# Patient Record
Sex: Female | Born: 1985 | Hispanic: No | Marital: Married | Smoking: Never smoker
Health system: Southern US, Community
[De-identification: ages and names within clinical notes are randomized; demographics above are authoritative.]

## PROBLEM LIST (undated history)

## (undated) DIAGNOSIS — N200 Calculus of kidney: Secondary | ICD-10-CM

## (undated) DIAGNOSIS — M25569 Pain in unspecified knee: Secondary | ICD-10-CM

## (undated) DIAGNOSIS — O139 Gestational [pregnancy-induced] hypertension without significant proteinuria, unspecified trimester: Secondary | ICD-10-CM

## (undated) DIAGNOSIS — Z87442 Personal history of urinary calculi: Secondary | ICD-10-CM

## (undated) DIAGNOSIS — R55 Syncope and collapse: Secondary | ICD-10-CM

## (undated) DIAGNOSIS — M4306 Spondylolysis, lumbar region: Secondary | ICD-10-CM

## (undated) HISTORY — PX: KIDNEY STONE SURGERY: SHX686

---
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
## 2004-07-06 ENCOUNTER — Ambulatory Visit (HOSPITAL_COMMUNITY): Payer: Self-pay | Admitting: Psychiatry

## 2004-10-19 ENCOUNTER — Ambulatory Visit (HOSPITAL_COMMUNITY): Payer: Self-pay | Admitting: Psychiatry

## 2005-01-14 ENCOUNTER — Ambulatory Visit (HOSPITAL_COMMUNITY): Payer: Self-pay | Admitting: Psychiatry

## 2005-07-05 ENCOUNTER — Ambulatory Visit (HOSPITAL_COMMUNITY): Payer: Self-pay | Admitting: Psychiatry

## 2005-07-23 ENCOUNTER — Other Ambulatory Visit: Admission: RE | Admit: 2005-07-23 | Discharge: 2005-07-23 | Payer: Self-pay | Admitting: Obstetrics and Gynecology

## 2005-09-11 ENCOUNTER — Ambulatory Visit (HOSPITAL_COMMUNITY): Payer: Self-pay | Admitting: Psychiatry

## 2005-10-11 ENCOUNTER — Ambulatory Visit (HOSPITAL_COMMUNITY): Admission: RE | Admit: 2005-10-11 | Discharge: 2005-10-11 | Payer: Self-pay | Admitting: Obstetrics and Gynecology

## 2005-12-09 ENCOUNTER — Inpatient Hospital Stay (HOSPITAL_COMMUNITY): Admission: AD | Admit: 2005-12-09 | Discharge: 2005-12-09 | Payer: Self-pay | Admitting: Obstetrics and Gynecology

## 2005-12-24 ENCOUNTER — Inpatient Hospital Stay (HOSPITAL_COMMUNITY): Admission: AD | Admit: 2005-12-24 | Discharge: 2005-12-24 | Payer: Self-pay | Admitting: Obstetrics and Gynecology

## 2005-12-26 ENCOUNTER — Inpatient Hospital Stay (HOSPITAL_COMMUNITY): Admission: AD | Admit: 2005-12-26 | Discharge: 2005-12-31 | Payer: Self-pay | Admitting: Obstetrics and Gynecology

## 2006-08-04 ENCOUNTER — Other Ambulatory Visit: Admission: RE | Admit: 2006-08-04 | Discharge: 2006-08-04 | Payer: Self-pay | Admitting: Obstetrics and Gynecology

## 2014-02-24 DIAGNOSIS — N201 Calculus of ureter: Secondary | ICD-10-CM

## 2014-02-24 HISTORY — DX: Calculus of ureter: N20.1

## 2014-03-02 DIAGNOSIS — N200 Calculus of kidney: Secondary | ICD-10-CM

## 2014-03-02 DIAGNOSIS — M549 Dorsalgia, unspecified: Secondary | ICD-10-CM | POA: Insufficient documentation

## 2014-03-02 DIAGNOSIS — G43909 Migraine, unspecified, not intractable, without status migrainosus: Secondary | ICD-10-CM | POA: Insufficient documentation

## 2014-03-02 DIAGNOSIS — R110 Nausea: Secondary | ICD-10-CM | POA: Insufficient documentation

## 2014-03-02 HISTORY — DX: Calculus of kidney: N20.0

## 2014-03-02 HISTORY — DX: Migraine, unspecified, not intractable, without status migrainosus: G43.909

## 2014-03-02 HISTORY — DX: Nausea: R11.0

## 2015-04-25 ENCOUNTER — Encounter (HOSPITAL_COMMUNITY): Payer: Self-pay | Admitting: Emergency Medicine

## 2015-04-25 ENCOUNTER — Emergency Department (HOSPITAL_COMMUNITY): Payer: No Typology Code available for payment source

## 2015-04-25 ENCOUNTER — Emergency Department (HOSPITAL_COMMUNITY)
Admission: EM | Admit: 2015-04-25 | Discharge: 2015-04-25 | Disposition: A | Discharge status: Home / Self Care | Payer: No Typology Code available for payment source | Attending: Emergency Medicine | Admitting: Emergency Medicine

## 2015-04-25 DIAGNOSIS — S40012A Contusion of left shoulder, initial encounter: Secondary | ICD-10-CM | POA: Diagnosis not present

## 2015-04-25 DIAGNOSIS — S8002XA Contusion of left knee, initial encounter: Secondary | ICD-10-CM | POA: Insufficient documentation

## 2015-04-25 DIAGNOSIS — Y9389 Activity, other specified: Secondary | ICD-10-CM | POA: Insufficient documentation

## 2015-04-25 DIAGNOSIS — Y92410 Unspecified street and highway as the place of occurrence of the external cause: Secondary | ICD-10-CM | POA: Insufficient documentation

## 2015-04-25 DIAGNOSIS — Z3202 Encounter for pregnancy test, result negative: Secondary | ICD-10-CM | POA: Insufficient documentation

## 2015-04-25 DIAGNOSIS — Y998 Other external cause status: Secondary | ICD-10-CM | POA: Diagnosis not present

## 2015-04-25 DIAGNOSIS — S4992XA Unspecified injury of left shoulder and upper arm, initial encounter: Secondary | ICD-10-CM | POA: Diagnosis present

## 2015-04-25 HISTORY — DX: Pain in unspecified knee: M25.569

## 2015-04-25 LAB — URINALYSIS, ROUTINE W REFLEX MICROSCOPIC
Bilirubin Urine: NEGATIVE
GLUCOSE, UA: NEGATIVE mg/dL
Ketones, ur: NEGATIVE mg/dL
Nitrite: NEGATIVE
PH: 6.5 (ref 5.0–8.0)
PROTEIN: NEGATIVE mg/dL
Specific Gravity, Urine: 1.024 (ref 1.005–1.030)
Urobilinogen, UA: 1 mg/dL (ref 0.0–1.0)

## 2015-04-25 LAB — URINE MICROSCOPIC-ADD ON

## 2015-04-25 LAB — POC URINE PREG, ED: PREG TEST UR: NEGATIVE

## 2015-04-25 MED ORDER — NAPROXEN 500 MG PO TABS: 500.0000 mg | ORAL_TABLET | Freq: Two times a day (BID) | ORAL | Status: DC

## 2015-04-25 MED ORDER — HYDROCODONE-ACETAMINOPHEN 5-325 MG PO TABS
1.0000 | ORAL_TABLET | Freq: Once | ORAL | Status: AC
  Administered 2015-04-25: 1 via ORAL
  Filled 2015-04-25: qty 1

## 2015-04-25 MED ORDER — HYDROCODONE-ACETAMINOPHEN 5-325 MG PO TABS: 1.0000 | ORAL_TABLET | ORAL | Status: DC | PRN

## 2015-04-25 MED ORDER — OXYCODONE-ACETAMINOPHEN 5-325 MG PO TABS
1.0000 | ORAL_TABLET | Freq: Once | ORAL | Status: AC
  Administered 2015-04-25: 1 via ORAL
  Filled 2015-04-25: qty 1
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
## 2015-04-30 ENCOUNTER — Emergency Department (HOSPITAL_COMMUNITY): Payer: Managed Care, Other (non HMO)

## 2015-04-30 ENCOUNTER — Encounter (HOSPITAL_COMMUNITY): Payer: Self-pay | Admitting: *Deleted

## 2015-04-30 ENCOUNTER — Emergency Department (HOSPITAL_COMMUNITY)
Admission: EM | Admit: 2015-04-30 | Discharge: 2015-04-30 | Disposition: A | Discharge status: Home / Self Care | Payer: Managed Care, Other (non HMO) | Attending: Emergency Medicine | Admitting: Emergency Medicine

## 2015-04-30 DIAGNOSIS — Z791 Long term (current) use of non-steroidal anti-inflammatories (NSAID): Secondary | ICD-10-CM | POA: Insufficient documentation

## 2015-04-30 DIAGNOSIS — M25512 Pain in left shoulder: Secondary | ICD-10-CM

## 2015-04-30 DIAGNOSIS — Y9389 Activity, other specified: Secondary | ICD-10-CM | POA: Diagnosis not present

## 2015-04-30 DIAGNOSIS — Y92410 Unspecified street and highway as the place of occurrence of the external cause: Secondary | ICD-10-CM | POA: Insufficient documentation

## 2015-04-30 DIAGNOSIS — S46912A Strain of unspecified muscle, fascia and tendon at shoulder and upper arm level, left arm, initial encounter: Secondary | ICD-10-CM | POA: Insufficient documentation

## 2015-04-30 DIAGNOSIS — S4992XA Unspecified injury of left shoulder and upper arm, initial encounter: Secondary | ICD-10-CM | POA: Diagnosis present

## 2015-04-30 DIAGNOSIS — T148XXA Other injury of unspecified body region, initial encounter: Secondary | ICD-10-CM

## 2015-04-30 DIAGNOSIS — Y998 Other external cause status: Secondary | ICD-10-CM | POA: Insufficient documentation

## 2015-04-30 MED ORDER — HYDROMORPHONE HCL 1 MG/ML IJ SOLN
1.0000 mg | Freq: Once | INTRAMUSCULAR | Status: AC
  Administered 2015-04-30: 1 mg via INTRAMUSCULAR
  Filled 2015-04-30: qty 1

## 2015-04-30 MED ORDER — METHOCARBAMOL 500 MG PO TABS: 500.0000 mg | ORAL_TABLET | Freq: Three times a day (TID) | ORAL | Status: DC | PRN

## 2015-04-30 NOTE — ED Notes (Signed)
Patient transported to CT 

## 2015-04-30 NOTE — Discharge Instructions (Signed)
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
## 2015-05-29 ENCOUNTER — Emergency Department (HOSPITAL_COMMUNITY)
Admission: EM | Admit: 2015-05-29 | Discharge: 2015-05-29 | Disposition: A | Discharge status: Home / Self Care | Payer: Managed Care, Other (non HMO) | Attending: Emergency Medicine | Admitting: Emergency Medicine

## 2015-05-29 ENCOUNTER — Encounter (HOSPITAL_COMMUNITY): Payer: Self-pay | Admitting: Emergency Medicine

## 2015-05-29 ENCOUNTER — Emergency Department (HOSPITAL_COMMUNITY): Payer: Managed Care, Other (non HMO)

## 2015-05-29 DIAGNOSIS — M25532 Pain in left wrist: Secondary | ICD-10-CM | POA: Diagnosis not present

## 2015-05-29 DIAGNOSIS — Z880 Allergy status to penicillin: Secondary | ICD-10-CM | POA: Insufficient documentation

## 2015-05-29 DIAGNOSIS — R200 Anesthesia of skin: Secondary | ICD-10-CM | POA: Diagnosis not present

## 2015-05-29 DIAGNOSIS — R2232 Localized swelling, mass and lump, left upper limb: Secondary | ICD-10-CM | POA: Diagnosis present

## 2015-05-29 LAB — D-DIMER, QUANTITATIVE (NOT AT ARMC): D DIMER QUANT: 0.34 ug{FEU}/mL (ref 0.00–0.48)

## 2015-05-29 MED ORDER — NAPROXEN 500 MG PO TABS: 500.0000 mg | ORAL_TABLET | Freq: Two times a day (BID) | ORAL | Status: DC

## 2015-05-29 MED ORDER — IBUPROFEN 800 MG PO TABS
800.0000 mg | ORAL_TABLET | Freq: Once | ORAL | Status: AC
  Administered 2015-05-29: 800 mg via ORAL
  Filled 2015-05-29: qty 1

## 2015-05-29 NOTE — ED Notes (Signed)
PA at bedside.

## 2015-05-29 NOTE — Discharge Instructions (Signed)
1. Medications: naproxen, usual home medications 2. Treatment: rest, drink plenty of fluids, splint 3. Follow Up: please followup with your orthopedist for discussion of your diagnoses and further evaluation after today's visit; please return to the ER for severe pain, poor blood flow to left hand, new or worsening symptoms    Paresthesia Paresthesia is a burning or prickling feeling. This feeling can happen in any part of the body. It often happens in the hands, arms, legs, or feet. HOME CARE  Avoid drinking alcohol.  Try massage or needle therapy (acupuncture) to help with your problems.  Keep all doctor visits as told. GET HELP RIGHT AWAY IF:   You feel weak.  You have trouble walking or moving.  You have problems speaking or seeing.  You feel confused.  You cannot control when you poop (bowel movement) or pee (urinate).  You lose feeling (numbness) after an injury.  You pass out (faint).  Your burning or prickling feeling gets worse when you walk.  You have pain, cramps, or feel dizzy.  You have a rash. MAKE SURE YOU:   Understand these instructions.  Will watch your condition.  Will get help right away if you are not doing well or get worse. Document Released: 08/22/2008 Document Revised: 12/02/2011 Document Reviewed: 05/31/2011 [facility] Patient Information 2015 [place], [place]. This information is not intended to replace advice given to you by your health care provider. Make sure you discuss any questions you have with your health care provider.
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
## 2015-05-29 NOTE — ED Notes (Signed)
Patient brought back to room; assisted patient with getting into a gown; Mandy, NT present in room

## 2015-05-29 NOTE — ED Notes (Signed)
Patient transported to X-ray 

## 2015-08-20 ENCOUNTER — Emergency Department (HOSPITAL_COMMUNITY): Payer: Managed Care, Other (non HMO)

## 2015-08-20 ENCOUNTER — Encounter (HOSPITAL_COMMUNITY): Payer: Self-pay | Admitting: Emergency Medicine

## 2015-08-20 ENCOUNTER — Observation Stay (HOSPITAL_COMMUNITY)
Admission: EM | Admit: 2015-08-20 | Discharge: 2015-08-22 | Disposition: A | Discharge status: Home / Self Care | Payer: Managed Care, Other (non HMO) | Attending: Internal Medicine | Admitting: Internal Medicine

## 2015-08-20 DIAGNOSIS — R609 Edema, unspecified: Secondary | ICD-10-CM

## 2015-08-20 DIAGNOSIS — F418 Other specified anxiety disorders: Secondary | ICD-10-CM | POA: Insufficient documentation

## 2015-08-20 DIAGNOSIS — E876 Hypokalemia: Secondary | ICD-10-CM | POA: Diagnosis not present

## 2015-08-20 DIAGNOSIS — M7989 Other specified soft tissue disorders: Secondary | ICD-10-CM | POA: Insufficient documentation

## 2015-08-20 DIAGNOSIS — Z791 Long term (current) use of non-steroidal anti-inflammatories (NSAID): Secondary | ICD-10-CM | POA: Diagnosis not present

## 2015-08-20 DIAGNOSIS — F419 Anxiety disorder, unspecified: Secondary | ICD-10-CM | POA: Diagnosis not present

## 2015-08-20 DIAGNOSIS — R55 Syncope and collapse: Secondary | ICD-10-CM | POA: Diagnosis not present

## 2015-08-20 LAB — CBC WITH DIFFERENTIAL/PLATELET
Basophils Absolute: 0 10*3/uL (ref 0.0–0.1)
Basophils Relative: 0 %
EOS ABS: 0.3 10*3/uL (ref 0.0–0.7)
Eosinophils Relative: 3 %
HEMATOCRIT: 40.3 % (ref 36.0–46.0)
HEMOGLOBIN: 13.4 g/dL (ref 12.0–15.0)
LYMPHS ABS: 3.2 10*3/uL (ref 0.7–4.0)
Lymphocytes Relative: 28 %
MCH: 29.1 pg (ref 26.0–34.0)
MCHC: 33.3 g/dL (ref 30.0–36.0)
MCV: 87.4 fL (ref 78.0–100.0)
MONOS PCT: 7 %
Monocytes Absolute: 0.9 10*3/uL (ref 0.1–1.0)
NEUTROS ABS: 7 10*3/uL (ref 1.7–7.7)
NEUTROS PCT: 62 %
Platelets: 270 10*3/uL (ref 150–400)
RBC: 4.61 MIL/uL (ref 3.87–5.11)
RDW: 12.6 % (ref 11.5–15.5)
WBC: 11.4 10*3/uL — AB (ref 4.0–10.5)

## 2015-08-20 LAB — COMPREHENSIVE METABOLIC PANEL
ALBUMIN: 3.7 g/dL (ref 3.5–5.0)
ALK PHOS: 61 U/L (ref 38–126)
ALT: 19 U/L (ref 14–54)
AST: 23 U/L (ref 15–41)
Anion gap: 8 (ref 5–15)
BILIRUBIN TOTAL: 0.5 mg/dL (ref 0.3–1.2)
BUN: 9 mg/dL (ref 6–20)
CALCIUM: 9.1 mg/dL (ref 8.9–10.3)
CO2: 21 mmol/L — ABNORMAL LOW (ref 22–32)
CREATININE: 0.72 mg/dL (ref 0.44–1.00)
Chloride: 106 mmol/L (ref 101–111)
GFR calc Af Amer: >60 mL/min (ref >60)
GLUCOSE: 90 mg/dL (ref 65–99)
Potassium: 3.3 mmol/L — ABNORMAL LOW (ref 3.5–5.1)
Sodium: 135 mmol/L (ref 135–145)
TOTAL PROTEIN: 6.8 g/dL (ref 6.5–8.1)

## 2015-08-20 LAB — I-STAT CHEM 8, ED
BUN: 10 mg/dL (ref 6–20)
CALCIUM ION: 1.19 mmol/L (ref 1.12–1.23)
CHLORIDE: 104 mmol/L (ref 101–111)
CREATININE: 0.6 mg/dL (ref 0.44–1.00)
Glucose, Bld: 89 mg/dL (ref 65–99)
HEMATOCRIT: 44 % (ref 36.0–46.0)
Hemoglobin: 15 g/dL (ref 12.0–15.0)
Potassium: 3.4 mmol/L — ABNORMAL LOW (ref 3.5–5.1)
SODIUM: 140 mmol/L (ref 135–145)
TCO2: 20 mmol/L (ref 0–100)

## 2015-08-20 LAB — I-STAT BETA HCG BLOOD, ED (MC, WL, AP ONLY): I-stat hCG, quantitative: 8.2 m[IU]/mL — ABNORMAL HIGH (ref <5)

## 2015-08-20 LAB — CBG MONITORING, ED: GLUCOSE-CAPILLARY: 97 mg/dL (ref 65–99)

## 2015-08-20 LAB — POC URINE PREG, ED: PREG TEST UR: NEGATIVE

## 2015-08-20 MED ORDER — IOHEXOL 350 MG/ML SOLN: 100.0000 mL | Freq: Once | INTRAVENOUS | Status: DC | PRN

## 2015-08-20 MED ORDER — SODIUM CHLORIDE 0.9 % IV SOLN
1000.0000 mL | INTRAVENOUS | Status: DC
  Administered 2015-08-20: 1000 mL via INTRAVENOUS

## 2015-08-20 MED ORDER — SODIUM CHLORIDE 0.9 % IV SOLN
1000.0000 mL | Freq: Once | INTRAVENOUS | Status: AC
  Administered 2015-08-20: 1000 mL via INTRAVENOUS

## 2015-08-20 MED ORDER — KETOROLAC TROMETHAMINE 30 MG/ML IJ SOLN
30.0000 mg | Freq: Once | INTRAMUSCULAR | Status: AC
  Administered 2015-08-20: 30 mg via INTRAVENOUS
  Filled 2015-08-20: qty 1

## 2015-08-20 MED ORDER — FENTANYL CITRATE (PF) 100 MCG/2ML IJ SOLN
50.0000 ug | Freq: Once | INTRAMUSCULAR | Status: AC
  Administered 2015-08-20: 50 ug via INTRAVENOUS
  Filled 2015-08-20: qty 2

## 2015-08-20 MED ORDER — IOHEXOL 350 MG/ML SOLN
50.0000 mL | Freq: Once | INTRAVENOUS | Status: AC | PRN
  Administered 2015-08-20: 50 mL via INTRAVENOUS

## 2015-08-20 NOTE — ED Provider Notes (Signed)
CSN: [id number]     Arrival date & time 08/20/15  2104 History   First MD Initiated Contact with Patient 08/20/15 2105     Chief Complaint  Patient presents with  . Loss of Consciousness     (Consider location/radiation/quality/duration/timing/severity/associated sxs/prior Treatment) Patient is a 29 y.o. female presenting with syncope.  Loss of Consciousness Episode history:  Multiple Most recent episode:  Today Timing:  Constant Chronicity:  New Context: normal activity   Context: not blood draw, not bowel movement, not standing up and not urination   Witnessed: yes   Relieved by:  None tried Worsened by:  Posture Ineffective treatments:  None tried Associated symptoms: anxiety and headaches   Associated symptoms: no chest pain, no focal weakness and no nausea     Past Medical History  Diagnosis Date  . Knee joint pain    History reviewed. No pertinent past surgical history. No family history on file. Social History  Substance Use Topics  . Smoking status: Never Smoker   . Smokeless tobacco: None  . Alcohol Use: No   OB History    No data available     Review of Systems  Constitutional: Positive for chills and fatigue.  HENT: Positive for congestion. Negative for nosebleeds and rhinorrhea.   Respiratory: Positive for cough.   Cardiovascular: Positive for syncope. Negative for chest pain.  Gastrointestinal: Negative for nausea and abdominal pain.  Genitourinary: Negative for flank pain and enuresis.  Musculoskeletal: Negative for myalgias and back pain.  Skin: Negative for pallor and wound.  Neurological: Positive for headaches. Negative for focal weakness.  Hematological: Negative for adenopathy.  All other systems reviewed and are negative.     Allergies  Amoxicillin  Home Medications   Prior to Admission medications   Medication Sig Start Date End Date Taking? Authorizing Provider  HYDROcodone-acetaminophen (NORCO) 5-325 MG per tablet Take 1-2  tablets by mouth every 4 (four) hours as needed for moderate pain. 04/25/15   Dione Booze, MD  methocarbamol (ROBAXIN) 500 MG tablet Take 1 tablet (500 mg total) by mouth every 8 (eight) hours as needed for muscle spasms. 04/30/15   Raeford Razor, MD  naproxen (NAPROSYN) 500 MG tablet Take 1 tablet (500 mg total) by mouth 2 (two) times daily with a meal. 05/29/15   Mady Gemma, PA-C   There were no vitals taken for this visit. Physical Exam  Constitutional: She is oriented to person, place, and time. She appears well-developed and well-nourished.  HENT:  Head: Normocephalic and atraumatic.  Eyes: Conjunctivae are normal. Pupils are equal, round, and reactive to light.  Neck: Normal range of motion.  Cardiovascular: Normal rate and regular rhythm.   Pulmonary/Chest: Effort normal and breath sounds normal. No stridor. No respiratory distress.  Abdominal: Soft. She exhibits no distension. There is no tenderness.  Musculoskeletal: Normal range of motion. She exhibits no edema.  Neurological: She is alert and oriented to person, place, and time.  No altered mental status, able to give full seemingly accurate history.  Face is symmetric, EOM's intact, pupils equal and reactive, vision intact, tongue and uvula midline without deviation Upper and Lower extremity motor 5/5, intact pain perception in distal extremities, 2+ reflexes in biceps, patella and achilles tendons.   Skin: Skin is warm and dry.  Psychiatric:  Upset, crying, agitated  Nursing note and vitals reviewed.   ED Course  Procedures (including critical care time) Labs Review Labs Reviewed  CBC WITH DIFFERENTIAL/PLATELET  COMPREHENSIVE METABOLIC PANEL  URINALYSIS,  ROUTINE W REFLEX MICROSCOPIC (NOT [facility])  POCT CBG (FASTING - GLUCOSE)-MANUAL ENTRY  I-STAT CHEM 8, ED  CBG MONITORING, ED  POC URINE PREG, ED    Imaging Review Ct Angio Head W/cm &/or Wo Cm  08/20/2015  CLINICAL DATA:  Multiple syncopal episodes today with  headache. Fell back and struck head on floor. Dizziness when supine. EXAM: CT ANGIOGRAPHY HEAD TECHNIQUE: Multidetector CT imaging of the head was performed using the standard protocol during bolus administration of intravenous contrast. Multiplanar CT image reconstructions and MIPs were obtained to evaluate the vascular anatomy. CONTRAST:  50mL OMNIPAQUE IOHEXOL 350 MG/ML SOLN COMPARISON:  CT head November 02, 2003 FINDINGS: CT HEAD The ventricles and sulci are normal. No intraparenchymal hemorrhage, mass effect nor midline shift. No acute large vascular territory infarcts. No abnormal intracranial enhancement. No abnormal extra-axial fluid collections. Basal cisterns are patent. Soft tissue predominately effaces the RIGHT maxillary sinus. Partially imaged secretions RIGHT maxillary sinus. CTA HEAD Mild motion degraded examination. Anterior circulation: Normal appearance of the cervical internal carotid arteries, petrous, cavernous and supra clinoid internal carotid arteries. Widely patent anterior communicating artery. Normal appearance of the anterior and middle cerebral arteries. Posterior circulation: Codominant vertebral arteries with normal appearance of the vertebral arteries, vertebrobasilar junction and basilar artery, as well as main branch vessels. Normal appearance of the posterior cerebral arteries. No large vessel occlusion, hemodynamically significant stenosis, dissection, luminal irregularity, contrast extravasation or aneurysm within the anterior nor posterior circulation. IMPRESSION: Normal CT head with and without contrast. RIGHT maxillary sinusitis and/or mucosal retention cyst. Mild motion degraded negative CTA head. Electronically Signed   By: Awilda Metroourtnay  Bloomer M.D.   On: 08/20/2015 23:00   Dg Chest Port 1 View  08/20/2015  CLINICAL DATA:  Syncopal episodes with headache, onset today. EXAM: PORTABLE CHEST 1 VIEW COMPARISON:  None. FINDINGS: A single AP portable view of the chest  demonstrates no focal airspace consolidation or alveolar edema. The lungs are grossly clear. There is no large effusion or pneumothorax. Cardiac and mediastinal contours appear unremarkable. IMPRESSION: No active disease. Electronically Signed   By: Ellery Plunkaniel R Mitchell M.D.   On: 08/20/2015 21:48   I have personally reviewed and evaluated these images and lab results as part of my medical decision-making.   EKG Interpretation   Date/Time:  Sunday August 20 2015 22:58:46 EST Ventricular Rate:  78 PR Interval:  173 QRS Duration: 85 QT Interval:  368 QTC Calculation: 419 R Axis:   90 Text Interpretation:  Sinus rhythm Borderline right axis deviation ED  PHYSICIAN INTERPRETATION AVAILABLE IN [facility] Confirmed by TEST,  Record ([id number]) on 08/21/2015 7:14:09 AM      MDM   Final diagnoses:  Syncope   Multiple episodes of syncope today, a couple with EMS and a couple seen by nursing here. One ECG done in ED, reviewed by me, while syncopal without arrhythmia. Workup in ED negative, neurology and medicine consulted, will admit to medicine for echo, eeg.      Marily MemosJason Sherlon Nied, MD 08/22/15 [phone]

## 2015-08-20 NOTE — ED Notes (Signed)
This nurse transported patient to and from CT, pt had one syncopal episode lasting 10 seconds while on the scanner.

## 2015-08-20 NOTE — ED Notes (Signed)
Pt transported to CT ?
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
## 2015-08-20 NOTE — ED Notes (Signed)
EKG unremarkable. 

## 2015-08-21 ENCOUNTER — Observation Stay (HOSPITAL_BASED_OUTPATIENT_CLINIC_OR_DEPARTMENT_OTHER): Payer: Managed Care, Other (non HMO)

## 2015-08-21 ENCOUNTER — Observation Stay (HOSPITAL_COMMUNITY): Payer: Managed Care, Other (non HMO)

## 2015-08-21 ENCOUNTER — Encounter (HOSPITAL_COMMUNITY): Payer: Self-pay | Admitting: Internal Medicine

## 2015-08-21 DIAGNOSIS — R55 Syncope and collapse: Secondary | ICD-10-CM

## 2015-08-21 DIAGNOSIS — E876 Hypokalemia: Secondary | ICD-10-CM

## 2015-08-21 DIAGNOSIS — R609 Edema, unspecified: Secondary | ICD-10-CM | POA: Diagnosis not present

## 2015-08-21 DIAGNOSIS — F419 Anxiety disorder, unspecified: Secondary | ICD-10-CM

## 2015-08-21 HISTORY — DX: Anxiety disorder, unspecified: F41.9

## 2015-08-21 HISTORY — DX: Hypokalemia: E87.6

## 2015-08-21 LAB — URINALYSIS, ROUTINE W REFLEX MICROSCOPIC
Bilirubin Urine: NEGATIVE
Glucose, UA: NEGATIVE mg/dL
Hgb urine dipstick: NEGATIVE
Ketones, ur: NEGATIVE mg/dL
NITRITE: NEGATIVE
PH: 7 (ref 5.0–8.0)
Protein, ur: NEGATIVE mg/dL
SPECIFIC GRAVITY, URINE: 1.029 (ref 1.005–1.030)

## 2015-08-21 LAB — BASIC METABOLIC PANEL
ANION GAP: 4 — AB (ref 5–15)
BUN: <5 mg/dL — ABNORMAL LOW (ref 6–20)
CALCIUM: 8.6 mg/dL — AB (ref 8.9–10.3)
CHLORIDE: 112 mmol/L — AB (ref 101–111)
CO2: 23 mmol/L (ref 22–32)
Creatinine, Ser: 0.51 mg/dL (ref 0.44–1.00)
GFR calc non Af Amer: >60 mL/min (ref >60)
Glucose, Bld: 103 mg/dL — ABNORMAL HIGH (ref 65–99)
Potassium: 4.2 mmol/L (ref 3.5–5.1)
Sodium: 139 mmol/L (ref 135–145)

## 2015-08-21 LAB — URINE MICROSCOPIC-ADD ON

## 2015-08-21 LAB — PHOSPHORUS: PHOSPHORUS: 1.8 mg/dL — AB (ref 2.5–4.6)

## 2015-08-21 LAB — TROPONIN I: Troponin I: <0.03 ng/mL (ref <0.031)

## 2015-08-21 LAB — TSH: TSH: 2.676 u[IU]/mL (ref 0.350–4.500)

## 2015-08-21 LAB — MAGNESIUM: MAGNESIUM: 1.9 mg/dL (ref 1.7–2.4)

## 2015-08-21 MED ORDER — HYDROCODONE-ACETAMINOPHEN 5-325 MG PO TABS: 1.0000 | ORAL_TABLET | ORAL | Status: DC | PRN

## 2015-08-21 MED ORDER — ONDANSETRON HCL 4 MG/2ML IJ SOLN: 4.0000 mg | Freq: Four times a day (QID) | INTRAMUSCULAR | Status: DC | PRN

## 2015-08-21 MED ORDER — SODIUM CHLORIDE 0.9 % IV SOLN
INTRAVENOUS | Status: DC
  Administered 2015-08-21: 18:00:00 via INTRAVENOUS

## 2015-08-21 MED ORDER — POTASSIUM CHLORIDE IN NACL 20-0.9 MEQ/L-% IV SOLN
INTRAVENOUS | Status: DC
  Administered 2015-08-21 (×2): via INTRAVENOUS
  Filled 2015-08-21 (×2): qty 1000

## 2015-08-21 MED ORDER — POTASSIUM CHLORIDE 20 MEQ/15ML (10%) PO SOLN
40.0000 meq | Freq: Once | ORAL | Status: AC
  Administered 2015-08-21: 40 meq via ORAL
  Filled 2015-08-21: qty 30

## 2015-08-21 MED ORDER — HYDROXYZINE HCL 25 MG PO TABS: 50.0000 mg | ORAL_TABLET | Freq: Four times a day (QID) | ORAL | Status: DC | PRN

## 2015-08-21 MED ORDER — ONDANSETRON HCL 4 MG PO TABS: 4.0000 mg | ORAL_TABLET | Freq: Four times a day (QID) | ORAL | Status: DC | PRN

## 2015-08-21 NOTE — Consult Note (Signed)
Neurology Consultation Reason for Consult: Loss of consciousness Referring Physician: Mesner, J  CC: Loss of consciousness  History is obtained from: Patient, companion  HPI: Kristin [name] is a 29 y.o. female with no significant past medical history who was in her normal state of health until today. She has now had proximate 10 episodes of syncope. The most recent has been over an hour without symptoms. The initial episode, she just recently stood up and was walking and then suddenly went down. She states that she felt lightheaded beforehand. Her companion states that she seemed to just go down, no convulsive activity. She was out for 10-15 seconds. Since that time, it is happened multiple times. Not all have been associated with postural change. It is happened at least twice since arrival in the hospital without EKG changes.  She does note that she has been under lots of stress.  She denies previous episodes, previous staring spells, morning myoclonic jerks.  ROS: A 14 point ROS was performed and is negative except as noted in the HPI.   Past Medical History  Diagnosis Date  . Knee joint pain      FHx: Mother is on Keppra, but patient is not sure if she has seizures or not.   Social History:  reports that she has never smoked. She does not have any smokeless tobacco history on file. She reports that she does not drink alcohol or use illicit drugs.   Exam: Current vital signs: BP 138/94 mmHg  Pulse 89  Temp(Src) 98.5 F (36.9 C)  Resp 23  SpO2 99%  LMP 07/20/2015 Vital signs in last 24 hours: Temp:  [98.5 F (36.9 C)] 98.5 F (36.9 C) (11/27 2119) Pulse Rate:  [67-89] 89 (11/27 2300) Resp:  [15-23] 23 (11/27 2300) BP: (128-140)/(63-95) 138/94 mmHg (11/27 2300) SpO2:  [97 %-100 %] 99 % (11/27 2300)   Physical Exam  Constitutional: Appears well-developed and well-nourished.  Psych: Affect appropriate to situation Eyes: No scleral injection HENT: No OP  obstrucion Head: Normocephalic.  Cardiovascular: Normal rate and regular rhythm.  Respiratory: Effort normal and breath sounds normal to anterior ascultation GI: Soft.  No distension. There is no tenderness.  Skin: WDI  Neuro: Mental Status: Patient is awake, alert, oriented to person, place, month, year, and situation. Patient is able to give a clear and coherent history. No signs of aphasia or neglect Cranial Nerves: II: Visual Fields are full. Pupils are equal, round, and reactive to light.   III,IV, VI: EOMI without ptosis or diploplia.  V: Facial sensation is symmetric to temperature VII: Facial movement is symmetric.  VIII: hearing is intact to voice X: Uvula elevates symmetrically XI: Shoulder shrug is symmetric. XII: tongue is midline without atrophy or fasciculations.  Motor: Tone is normal. Bulk is normal. 5/5 strength was present in all four extremities.  Sensory: Sensation is symmetric to light touch and temperature in the arms and legs. Cerebellar: FNF  intact bilaterally         I have reviewed labs in epic and the results pertinent to this consultation are: Beta hCG is positive  I have reviewed the images obtained: CTA head-negative  Impression: 29 year old female with recurrent syncope. I think that the possibility of seizures is relatively low. The brevity of the episodes coupled with no clonic activity and no postictal state would argue against seizure as the cause. This being said, with this many episodes I think that an EEG would be prudent. If this is negative, then  I do not think that further imaging in addition to the already performed a CT/CTA is likely to be of benefit. She does appear to have lots of stress, and her beta hCG is positive. With negative EKG during her episodes of loss of consciousness, arrhythmia appears less likely. She could still, however, be hypotensive during the events.  Recommendations: 1) EEG 2) further cardiac evaluation  deferred to internal medicine 3) neurology will follow   Ritta Slot, MD [facility] [phone]  If 7pm- 7am, please page neurology on call as listed in AMION.

## 2015-08-21 NOTE — Progress Notes (Signed)
LTM day 1 started (may just be Prolonged if episode captured).

## 2015-08-21 NOTE — Progress Notes (Signed)
  Echocardiogram 2D Echocardiogram has been performed.  Arvil ChacoFoster, Jaykwon Morones 08/21/2015, 3:15 PM

## 2015-08-21 NOTE — Progress Notes (Signed)
*  PRELIMINARY RESULTS* Vascular Ultrasound Carotid Duplex (Doppler) has been completed.  Findings suggest 1-39% internal carotid artery stenosis bilaterally. Vertebral arteries are patent with antegrade flow.   Left upper extremity venous duplex completed. Left upper extremity is negative for deep and superficial vein thrombosis.  08/21/2015 5:18 PM Gertie FeyMichelle Yaniyah Koors, RVT, RDCS, RDMS

## 2015-08-21 NOTE — H&P (Addendum)
[facility] History and Physical  SCHARLENE CATALINA ZOX:[id number] DOB: 08-01-1986 DOA: 08/20/2015  Referring physician: Harold Hedge, M.D. PCP: No PCP Per Patient   Chief Complaint: Loss of consciousness.  HPI: AMSI GRIMLEY is a 29 y.o. female with no past medical history who comes to the emergency department with a history of multiple episodes of syncope that are preceded by lightheadedness and followed by brief loss of consciousness of maybe 15 seconds after the event. Her first episode was witnessed by her significant other, who is states that there was no tonic-clonic movements, tongue biting or sphincteric incontinence. He is states that these episodes have happened twice since she has been in the hospital. She is currently in no acute distress and denied any other complaints.   Review of Systems:  Constitutional:  No weight loss, night sweats, Fevers, chills, fatigue.  HEENT:  No headaches, Difficulty swallowing,Tooth/dental problems,Sore throat,  No sneezing, itching, ear ache, nasal congestion, post nasal drip,  Cardio-vascular:  Positive dizziness preceding syncopal episodes. No chest pain, Orthopnea, PND, swelling in lower extremities, anasarca,  palpitations  GI:  No heartburn, indigestion, abdominal pain, nausea, vomiting, diarrhea, change in bowel habits, loss of appetite  Resp:  No shortness of breath with exertion or at rest. No excess mucus, no productive cough, No non-productive cough, No coughing up of blood.No change in color of mucus.No wheezing.No chest wall deformity  Skin:  no rash or lesions.  GU:  no dysuria, change in color of urine, no urgency or frequency. No flank pain.  Musculoskeletal:  No joint pain or swelling. No decreased range of motion. No back pain.  Psych:  Positive Situational stress and sleep disturbance. Neuro: Loss of consciousness as above described.  Past Medical History  Diagnosis Date  . Knee joint pain    History  reviewed. No pertinent past surgical history. Social History:  reports that she has never smoked. She does not have any smokeless tobacco history on file. She reports that she does not drink alcohol or use illicit drugs.  Allergies  Allergen Reactions  . Amoxicillin Hives    Has patient had a PCN reaction causing immediate rash, facial/tongue/throat swelling, SOB or lightheadedness with hypotension: Yes Has patient had a PCN reaction causing severe rash involving mucus membranes or skin necrosis: No Has patient had a PCN reaction that required hospitalization No Has patient had a PCN reaction occurring within the last 10 years: No If all of the above answers are "NO", then may proceed with Cephalosporin use.    Family History  Problem Relation Age of Onset  . CVA Mother   . Hypertension Mother   . Diabetes Mellitus II Mother   . Congestive Heart Failure Mother   . CVA Father   . Hypertension Father   . Diabetes Mellitus II Father     Prior to Admission medications   Medication Sig Start Date End Date Taking? Authorizing Provider  HYDROcodone-acetaminophen (NORCO) 5-325 MG per tablet Take 1-2 tablets by mouth every 4 (four) hours as needed for moderate pain. Patient taking differently: Take 1 tablet by mouth every 4 (four) hours as needed for moderate pain.  04/25/15  Yes Dione Booze, MD  methocarbamol (ROBAXIN) 500 MG tablet Take 1 tablet (500 mg total) by mouth every 8 (eight) hours as needed for muscle spasms. 04/30/15  Yes Raeford Razor, MD  naproxen (NAPROSYN) 500 MG tablet Take 1 tablet (500 mg total) by mouth 2 (two) times daily with a meal. Patient not  taking: Reported on 08/20/2015 05/29/15   Mady Gemma, PA-C   Physical Exam: Filed Vitals:   08/21/15 0030 08/21/15 0100 08/21/15 0115 08/21/15 0141  BP: 122/67 105/61 116/58 118/63  Pulse: 80 83 77 82  Temp:    98.6 F (37 C)  TempSrc:    Oral  Resp: SpO2: 98% 95% 90% 99%    Wt Readings from Last 3  Encounters:  05/29/15 98.113 kg (216 lb 4.8 oz)  04/25/15 99.202 kg (218 lb 11.2 oz)    General:  Appears calm and comfortable Eyes: PERRL, normal lids, irises & conjunctiva ENT: grossly normal hearing, lips & tongue Neck: no LAD, masses or thyromegaly Cardiovascular: RRR, no m/r/g. No LE edema. Telemetry: SR, no arrhythmias  Respiratory: CTA bilaterally, no w/r/r. Normal respiratory effort. Abdomen: soft, ntnd Skin: no rash or induration seen on limited exam Musculoskeletal: grossly normal tone BUE/BLE Psychiatric: grossly normal mood and affect, speech fluent and appropriate Neurologic: Awake alert oriented 3, grossly non-focal.          Labs on Admission:  Basic Metabolic Panel:  Recent Labs Lab 08/20/15 2126 08/20/15 2140  NA 135 140  K 3.3* 3.4*  CL 106 104  CO2 21*  --   GLUCOSE 90 89  BUN 9 10  CREATININE 0.72 0.60  CALCIUM 9.1  --   MG  --  1.9  PHOS  --  1.8*   Liver Function Tests:  Recent Labs Lab 08/20/15 2126  AST 23  ALT 19  ALKPHOS 61  BILITOT 0.5  PROT 6.8  ALBUMIN 3.7   CBC:  Recent Labs Lab 08/20/15 2126 08/20/15 2140  WBC 11.4*  --   NEUTROABS 7.0  --   HGB 13.4 15.0  HCT 40.3 44.0  MCV 87.4  --   PLT 270  --     CBG:  Recent Labs Lab 08/20/15 2122  GLUCAP 97    Radiological Exams on Admission: Ct Angio Head W/cm &/or Wo Cm  08/20/2015  CLINICAL DATA:  Multiple syncopal episodes today with headache. Fell back and struck head on floor. Dizziness when supine. EXAM: CT ANGIOGRAPHY HEAD TECHNIQUE: Multidetector CT imaging of the head was performed using the standard protocol during bolus administration of intravenous contrast. Multiplanar CT image reconstructions and MIPs were obtained to evaluate the vascular anatomy. CONTRAST:  50mL OMNIPAQUE IOHEXOL 350 MG/ML SOLN COMPARISON:  CT head November 02, 2003 FINDINGS: CT HEAD The ventricles and sulci are normal. No intraparenchymal hemorrhage, mass effect nor midline shift. No  acute large vascular territory infarcts. No abnormal intracranial enhancement. No abnormal extra-axial fluid collections. Basal cisterns are patent. Soft tissue predominately effaces the RIGHT maxillary sinus. Partially imaged secretions RIGHT maxillary sinus. CTA HEAD Mild motion degraded examination. Anterior circulation: Normal appearance of the cervical internal carotid arteries, petrous, cavernous and supra clinoid internal carotid arteries. Widely patent anterior communicating artery. Normal appearance of the anterior and middle cerebral arteries. Posterior circulation: Codominant vertebral arteries with normal appearance of the vertebral arteries, vertebrobasilar junction and basilar artery, as well as main branch vessels. Normal appearance of the posterior cerebral arteries. No large vessel occlusion, hemodynamically significant stenosis, dissection, luminal irregularity, contrast extravasation or aneurysm within the anterior nor posterior circulation. IMPRESSION: Normal CT head with and without contrast. RIGHT maxillary sinusitis and/or mucosal retention cyst. Mild motion degraded negative CTA head. Electronically Signed   By: Awilda Metro M.D.   On: 08/20/2015 23:00   Dg Chest [facility]  08/20/2015  CLINICAL DATA:  Syncopal episodes with headache, onset today. EXAM: PORTABLE CHEST 1 VIEW COMPARISON:  None. FINDINGS: A single AP portable view of the chest demonstrates no focal airspace consolidation or alveolar edema. The lungs are grossly clear. There is no large effusion or pneumothorax. Cardiac and mediastinal contours appear unremarkable. IMPRESSION: No active disease. Electronically Signed   By: Ellery Plunkaniel R Mitchell M.D.   On: 08/20/2015 21:48    EKG: Independently reviewed. Vent. rate 78 BPM PR interval 173 ms QRS duration 85 ms QT/QTc 368/419 ms P-R-T axes 75 90 76 Sinus rhythm Borderline right axis deviation  Assessment/Plan Principal Problem:   Syncope and collapse Admit to  telemetry for cardiac monitoring. Serial troponin levels. Check echocardiogram. Check bilateral carotid Doppler ultrasound. Check EEG.  Active Problems:   Hypokalemia Currently replacing. Check follow-up potassium level.    Anxiety This is situational, will order Hydroxyzine PRN.   Neurology is on the case. Ritta Slot(Mcneill Kirkpatrick, M.D.)  Code Status: Full code. DVT Prophylaxis: Mechanical with SCDs. Family Communication: Her husband was by the bedside. Disposition Plan: Admit to telemetry for monitoring and further workup.  Time spent: Over 70 minutes were spent during the process of his admission.  Bobette Moavid Manuel Maleeyah Mccaughey [facility] Pager [phone].

## 2015-08-21 NOTE — Progress Notes (Signed)
Report received. Pt arrived to unit. No complaints of pain. Pt oriented to room. Resting comfortably. Will continue to monitor closely.  Sandrea HammondJunris Leeta Grimme RN
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
## 2015-08-21 NOTE — Progress Notes (Signed)
Subjective: No further episodes while in hospital. No complaints. Awaiting EEG.   Objective: Current vital signs: BP 118/63 mmHg  Pulse 82  Temp(Src) 98.6 F (37 C) (Oral)  Resp 15  SpO2 99%  LMP 07/20/2015 Vital signs in last 24 hours: Temp:  [98.5 F (36.9 C)-98.6 F (37 C)] 98.6 F (37 C) (11/28 0141) Pulse Rate:  [67-89] 82 (11/28 0141) Resp:  [15-23] 15 (11/28 0141) BP: (100-140)/(58-95) 118/63 mmHg (11/28 0141) SpO2:  [90 %-100 %] 99 % (11/28 0141)  Intake/Output from previous day: 11/27 0701 - 11/28 0700 In: 2000 [I.V.:2000] Out: -  Intake/Output this shift:   Nutritional status: Diet Heart Room service appropriate?: Yes; Fluid consistency:: Thin  Neurologic Exam: General: NAD Mental Status: Alert, oriented, thought content appropriate.  Speech fluent without evidence of aphasia.  Able to follow 3 step commands without difficulty. Cranial Nerves: II:  Visual fields grossly normal, pupils equal, round, reactive to light and accommodation III,IV, VI: ptosis not present, extra-ocular motions intact bilaterally V,VII: smile symmetric, facial light touch sensation normal bilaterally VIII: hearing normal bilaterally IX,X: uvula rises symmetrically XI: bilateral shoulder shrug XII: midline tongue extension without atrophy or fasciculations  Motor: Right : Upper extremity   5/5    Left:     Upper extremity   5/5  Lower extremity   5/5     Lower extremity   5/5 Tone and bulk:normal tone throughout; no atrophy noted Sensory: Pinprick and light touch intact throughout, bilaterally Deep Tendon Reflexes:  Right: Upper Extremity   Left: Upper extremity   biceps (C-5 to C-6) 2/4   biceps (C-5 to C-6) 2/4 tricep (C7) 2/4    triceps (C7) 2/4 Brachioradialis (C6) 2/4  Brachioradialis (C6) 2/4  Lower Extremity Lower Extremity  quadriceps (L-2 to L-4) 2/4   quadriceps (L-2 to L-4) 2/4 Achilles (S1) 2/4   Achilles (S1) 2/4  Plantars: Right: downgoing   Left:  downgoing Cerebellar: normal finger-to-nose,  normal heel-to-shin test    Lab Results: Basic Metabolic Panel:  Recent Labs Lab 08/20/15 2126 08/20/15 2140  NA 135 140  K 3.3* 3.4*  CL 106 104  CO2 21*  --   GLUCOSE 90 89  BUN 9 10  CREATININE 0.72 0.60  CALCIUM 9.1  --   MG  --  1.9  PHOS  --  1.8*    Liver Function Tests:  Recent Labs Lab 08/20/15 2126  AST 23  ALT 19  ALKPHOS 61  BILITOT 0.5  PROT 6.8  ALBUMIN 3.7   No results for input(s): LIPASE, AMYLASE in the last 168 hours. No results for input(s): AMMONIA in the last 168 hours.  CBC:  Recent Labs Lab 08/20/15 2126 08/20/15 2140  WBC 11.4*  --   NEUTROABS 7.0  --   HGB 13.4 15.0  HCT 40.3 44.0  MCV 87.4  --   PLT 270  --     Cardiac Enzymes:  Recent Labs Lab 08/21/15 0327  TROPONINI <0.03    Lipid Panel: No results for input(s): CHOL, TRIG, HDL, CHOLHDL, VLDL, LDLCALC in the last 168 hours.  CBG:  Recent Labs Lab 08/20/15 2122  GLUCAP 97    Microbiology: No results found for this or any previous visit.  Coagulation Studies: No results for input(s): LABPROT, INR in the last 72 hours.  Imaging: Ct Angio Head W/cm &/or Wo Cm  08/20/2015  CLINICAL DATA:  Multiple syncopal episodes today with headache. Fell back and struck head on floor. Dizziness when  supine. EXAM: CT ANGIOGRAPHY HEAD TECHNIQUE: Multidetector CT imaging of the head was performed using the standard protocol during bolus administration of intravenous contrast. Multiplanar CT image reconstructions and MIPs were obtained to evaluate the vascular anatomy. CONTRAST:  50mL OMNIPAQUE IOHEXOL 350 MG/ML SOLN COMPARISON:  CT head November 02, 2003 FINDINGS: CT HEAD The ventricles and sulci are normal. No intraparenchymal hemorrhage, mass effect nor midline shift. No acute large vascular territory infarcts. No abnormal intracranial enhancement. No abnormal extra-axial fluid collections. Basal cisterns are patent. Soft tissue  predominately effaces the RIGHT maxillary sinus. Partially imaged secretions RIGHT maxillary sinus. CTA HEAD Mild motion degraded examination. Anterior circulation: Normal appearance of the cervical internal carotid arteries, petrous, cavernous and supra clinoid internal carotid arteries. Widely patent anterior communicating artery. Normal appearance of the anterior and middle cerebral arteries. Posterior circulation: Codominant vertebral arteries with normal appearance of the vertebral arteries, vertebrobasilar junction and basilar artery, as well as main branch vessels. Normal appearance of the posterior cerebral arteries. No large vessel occlusion, hemodynamically significant stenosis, dissection, luminal irregularity, contrast extravasation or aneurysm within the anterior nor posterior circulation. IMPRESSION: Normal CT head with and without contrast. RIGHT maxillary sinusitis and/or mucosal retention cyst. Mild motion degraded negative CTA head. Electronically Signed   By: Awilda Metroourtnay  Bloomer M.D.   On: 08/20/2015 23:00   Dg Chest Port 1 View  08/20/2015  CLINICAL DATA:  Syncopal episodes with headache, onset today. EXAM: PORTABLE CHEST 1 VIEW COMPARISON:  None. FINDINGS: A single AP portable view of the chest demonstrates no focal airspace consolidation or alveolar edema. The lungs are grossly clear. There is no large effusion or pneumothorax. Cardiac and mediastinal contours appear unremarkable. IMPRESSION: No active disease. Electronically Signed   By: Ellery Plunkaniel R Mitchell M.D.   On: 08/20/2015 21:48    Medications:  Current Facility-Administered Medications  Medication Dose Route Frequency Provider Last Rate Last Dose  . 0.9 % NaCl with KCl 20 mEq/ L  infusion   Intravenous Continuous Bobette Moavid Manuel Ortiz, MD 125 mL/hr at 08/21/15 0220    . HYDROcodone-acetaminophen (NORCO/VICODIN) 5-325 MG per tablet 1 tablet  1 tablet Oral Q4H PRN Bobette Moavid Manuel Ortiz, MD      . hydrOXYzine (ATARAX/VISTARIL) tablet 50  mg  50 mg Oral Q6H PRN Bobette Moavid Manuel Ortiz, MD      . ondansetron [facility](ZOFRAN) tablet 4 mg  4 mg Oral Q6H PRN Bobette Moavid Manuel Ortiz, MD       Or  . ondansetron [facility](ZOFRAN) injection 4 mg  4 mg Intravenous Q6H PRN Bobette Moavid Manuel Ortiz, MD        Assessment/Plan:  29 YO female with multiple episodes of syncope including one episode while in hospital. As stated previously, the brevity of the episodes coupled with no clonic activity and no postictal state would argue against seizure as the cause. This being said, with this many episodes I think that an EEG would be prudent.   Recommend:  1) EEG monitoring 2) further cardiac evaluation deferred to internal medicine 3) neurology will follow   Felicie MornDavid Smith PA-C [facility] [phone]  08/21/2015, 8:59 AM    Patient evaluated with physician assistant, Felicie Mornavid Smith. Helped him formulate the plan. Agree with the documented assessment.

## 2015-08-22 DIAGNOSIS — R55 Syncope and collapse: Secondary | ICD-10-CM | POA: Diagnosis not present

## 2015-08-22 LAB — BASIC METABOLIC PANEL
Anion gap: 6 (ref 5–15)
BUN: 5 mg/dL — AB (ref 6–20)
CHLORIDE: 110 mmol/L (ref 101–111)
CO2: 22 mmol/L (ref 22–32)
CREATININE: 0.61 mg/dL (ref 0.44–1.00)
Calcium: 8.8 mg/dL — ABNORMAL LOW (ref 8.9–10.3)
GFR calc Af Amer: >60 mL/min (ref >60)
GFR calc non Af Amer: >60 mL/min (ref >60)
Glucose, Bld: 95 mg/dL (ref 65–99)
POTASSIUM: 4.1 mmol/L (ref 3.5–5.1)
SODIUM: 138 mmol/L (ref 135–145)
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
## 2015-08-22 NOTE — Discharge Instructions (Signed)
Follow with Primary MD in 7 days   Do not drive, operating heavy machinery, perform activities at heights, swimming or participation in water activities or provide baby sitting services if your were admitted for syncope or siezures until you have seen by Primary MD or a Neurologist and advised to do so again.  Get CBC, CMP,  checked  by Primary MD next visit.    Activity: As tolerated with Full fall precautions use walker/cane & assistance as needed   Disposition Home   Diet: regular diet   For Heart failure patients - Check your Weight same time everyday, if you gain over 2 pounds, or you develop in leg swelling, experience more shortness of breath or chest pain, call your Primary MD immediately. Follow Cardiac Low Salt Diet and 1.5 lit/day fluid restriction.   On your next visit with your primary care physician please Get Medicines reviewed and adjusted.   Please request your Prim.MD to go over all Hospital Tests and Procedure/Radiological results at the follow up, please get all Hospital records sent to your Prim MD by signing hospital release before you go home.   If you experience worsening of your admission symptoms, develop shortness of breath, life threatening emergency, suicidal or homicidal thoughts you must seek medical attention immediately by calling 911 or calling your MD immediately  if symptoms less severe.  You Must read complete instructions/literature along with all the possible adverse reactions/side effects for all the Medicines you take and that have been prescribed to you. Take any new Medicines after you have completely understood and accpet all the possible adverse reactions/side effects.    Do not drive when taking Pain medications.    Do not take more than prescribed Pain, Sleep and Anxiety Medications  Special Instructions: If you have smoked or chewed Tobacco  in the last 2 yrs please stop smoking, stop any regular Alcohol  and or any Recreational drug  use.  Wear Seat belts while driving.   Please note  You were cared for by a hospitalist during your hospital stay. If you have any questions about your discharge medications or the care you received while you were in the hospital after you are discharged, you can call the unit and asked to speak with the hospitalist on call if the hospitalist that took care of you is not available. Once you are discharged, your primary care physician will handle any further medical issues. Please note that NO REFILLS for any discharge medications will be authorized once you are discharged, as it is imperative that you return to your primary care physician (or establish a relationship with a primary care physician if you do not have one) for your aftercare needs so that they can reassess your need for medications and monitor your lab values.

## 2015-08-22 NOTE — Progress Notes (Signed)
Pt denies any dizziness or feeling faint this shift. Vital signs have been stable. She ambulated in the hallways-- [id number] and tolerated well, without any complaints of dizziness. She is for discharge to home today. I reviewed discharge instructions with pt and husband. They both verbalized understanding.

## 2015-08-22 NOTE — Progress Notes (Signed)
LTM taken down. No skin breakdown seen.

## 2015-08-23 NOTE — Procedures (Signed)
Introduction:  This is a  overnight video EEG recording, with  a19 channel scalp EEG performed at the bedside with bipolar and monopolar montages arranged in accordance to the international 10/20 system of electrode placement. One channel was dedicated to EKG recording.   Findings:  The background rhythm was normal 9-10 Hz alpha .  Drowsiness and normal sleep stages were noted.  No definite evidence of abnormal epileptiform discharges or electrographic seizures were noted during this recording.  Patient did not have any clinical spells on the video recording.  No push button events were noted.   Impression:  This is a normal awake and drowsy overnight video EEG recording.  No clinical spells were noted during this recording. Clinical correlation is recommended .

## 2015-08-23 NOTE — Progress Notes (Signed)
Subjective:   patient completed overnight 24 hours of long-term video EEG monitoring.  She did not have any spells during this EEG monitoring. No new neurological symptoms.    the EEG was normal with no evidence of abnormal Discharges or Electrographic Seizures noted during this recording.    Objective: Current vital signs: BP 111/67 mmHg  Pulse 73  Temp(Src) 98 F (36.7 C) (Oral)  Resp 18  SpO2 100%  LMP 07/20/2015 Vital signs in last 24 hours: Temp:  [98 F (36.7 C)] 98 F (36.7 C) (11/29 1300) SpO2:  [100 %] 100 % (11/29 1300)  Intake/Output from previous day: 11/29 0701 - 11/30 0700 In: 480 [P.O.:480] Out: 701 [Urine:700; Stool:1] Intake/Output this shift: Total I/O In: 2678.3 [P.O.:720; I.V.:1958.3] Out: 3501 [Urine:3500; Stool:1] Nutritional status:    Neurologic Exam:  non focal, normal mental status and cranial nerves, full motor strength,  No tremors  Or appendicular ataxia noted.  Lab Results: Basic Metabolic Panel:  Recent Labs Lab 08/20/15 2126 08/20/15 2140 08/21/15 0915 08/22/15 0230  NA 135 140 139 138  K 3.3* 3.4* 4.2 4.1  CL 106 104 112* 110  CO2 21*  --  23 22  GLUCOSE 90 89 103* 95  BUN 9 10 <5* 5*  CREATININE 0.72 0.60 0.51 0.61  CALCIUM 9.1  --  8.6* 8.8*  MG  --  1.9  --   --   PHOS  --  1.8*  --   --     Liver Function Tests:  Recent Labs Lab 08/20/15 2126  AST 23  ALT 19  ALKPHOS 61  BILITOT 0.5  PROT 6.8  ALBUMIN 3.7   No results for input(s): LIPASE, AMYLASE in the last 168 hours. No results for input(s): AMMONIA in the last 168 hours.  CBC:  Recent Labs Lab 08/20/15 2126 08/20/15 2140  WBC 11.4*  --   NEUTROABS 7.0  --   HGB 13.4 15.0  HCT 40.3 44.0  MCV 87.4  --   PLT 270  --     Cardiac Enzymes:  Recent Labs Lab 08/21/15 0327 08/21/15 0915  TROPONINI <0.03 <0.03    Lipid Panel: No results for input(s): CHOL, TRIG, HDL, CHOLHDL, VLDL, LDLCALC in the last 168 hours.  CBG:  Recent Labs Lab  08/20/15 2122  GLUCAP 97    Microbiology: No results found for this or any previous visit.  Coagulation Studies: No results for input(s): LABPROT, INR in the last 72 hours.  Imaging: No results found.  Medications: No current facility-administered medications for this encounter.  Current outpatient prescriptions:  .  HYDROcodone-acetaminophen (NORCO) 5-325 MG per tablet, Take 1-2 tablets by mouth every 4 (four) hours as needed for moderate pain. (Patient taking differently: Take 1 tablet by mouth every 4 (four) hours as needed for moderate pain. ), Disp: 20 tablet, Rfl: 0 .  methocarbamol (ROBAXIN) 500 MG tablet, Take 1 tablet (500 mg total) by mouth every 8 (eight) hours as needed for muscle spasms., Disp: 12 tablet, Rfl: 0,  Assessment/Plan:   29 year old female patient with spells consisting of brief periods of altered mental status and unresponsiveness.  Although she has multiple such spells on a daily basis,  She did not have any spells during last 24 hours with long-term  Video EEG recording. The EEG was normal with no evidence of abnormal epileptiform discharges or electrographic seizures noted.  In a patient with multiple such daily spells,  It would be rather unusual to have a normal 24-hour  EEG.  This increases the possibility of these transient spells of unresponsiveness being nonepileptic in nature.  No new antiepileptic medications are recommended at this point.  Patient advised to continue outpatient follow-up with cardiology and neurology as deemed appropriate based on clinical symptoms.  EEG results discussed with primary hospitalist physician .   neurology will sign off.

## 2015-08-26 ENCOUNTER — Emergency Department (HOSPITAL_COMMUNITY): Payer: Managed Care, Other (non HMO)

## 2015-08-26 ENCOUNTER — Encounter (HOSPITAL_COMMUNITY): Payer: Self-pay | Admitting: Emergency Medicine

## 2015-08-26 ENCOUNTER — Emergency Department (HOSPITAL_COMMUNITY)
Admission: EM | Admit: 2015-08-26 | Discharge: 2015-08-27 | Disposition: A | Discharge status: Home / Self Care | Payer: Managed Care, Other (non HMO) | Attending: Emergency Medicine | Admitting: Emergency Medicine

## 2015-08-26 DIAGNOSIS — Z3A01 Less than 8 weeks gestation of pregnancy: Secondary | ICD-10-CM | POA: Insufficient documentation

## 2015-08-26 DIAGNOSIS — N200 Calculus of kidney: Secondary | ICD-10-CM | POA: Insufficient documentation

## 2015-08-26 DIAGNOSIS — N390 Urinary tract infection, site not specified: Secondary | ICD-10-CM

## 2015-08-26 DIAGNOSIS — Z880 Allergy status to penicillin: Secondary | ICD-10-CM | POA: Insufficient documentation

## 2015-08-26 DIAGNOSIS — O26831 Pregnancy related renal disease, first trimester: Secondary | ICD-10-CM | POA: Insufficient documentation

## 2015-08-26 DIAGNOSIS — O2341 Unspecified infection of urinary tract in pregnancy, first trimester: Secondary | ICD-10-CM | POA: Insufficient documentation

## 2015-08-26 DIAGNOSIS — O9989 Other specified diseases and conditions complicating pregnancy, childbirth and the puerperium: Secondary | ICD-10-CM | POA: Diagnosis present

## 2015-08-26 LAB — URINE MICROSCOPIC-ADD ON

## 2015-08-26 LAB — COMPREHENSIVE METABOLIC PANEL
ALBUMIN: 4 g/dL (ref 3.5–5.0)
ALT: 25 U/L (ref 14–54)
ANION GAP: 7 (ref 5–15)
AST: 22 U/L (ref 15–41)
Alkaline Phosphatase: 61 U/L (ref 38–126)
BILIRUBIN TOTAL: 0.6 mg/dL (ref 0.3–1.2)
BUN: 11 mg/dL (ref 6–20)
CALCIUM: 9.3 mg/dL (ref 8.9–10.3)
CHLORIDE: 106 mmol/L (ref 101–111)
CO2: 23 mmol/L (ref 22–32)
CREATININE: 0.61 mg/dL (ref 0.44–1.00)
GFR calc Af Amer: >60 mL/min (ref >60)
GFR calc non Af Amer: >60 mL/min (ref >60)
Glucose, Bld: 94 mg/dL (ref 65–99)
POTASSIUM: 3.6 mmol/L (ref 3.5–5.1)
SODIUM: 136 mmol/L (ref 135–145)
Total Protein: 7.6 g/dL (ref 6.5–8.1)

## 2015-08-26 LAB — HCG, QUANTITATIVE, PREGNANCY: HCG, BETA CHAIN, QUANT, S: 448 m[IU]/mL — AB (ref <5)

## 2015-08-26 LAB — CBC
HCT: 39.8 % (ref 36.0–46.0)
Hemoglobin: 13.3 g/dL (ref 12.0–15.0)
MCH: 29 pg (ref 26.0–34.0)
MCHC: 33.4 g/dL (ref 30.0–36.0)
MCV: 86.9 fL (ref 78.0–100.0)
PLATELETS: 281 10*3/uL (ref 150–400)
RBC: 4.58 MIL/uL (ref 3.87–5.11)
RDW: 12.9 % (ref 11.5–15.5)
WBC: 13 10*3/uL — ABNORMAL HIGH (ref 4.0–10.5)

## 2015-08-26 LAB — LIPASE, BLOOD: Lipase: 27 U/L (ref 11–51)

## 2015-08-26 LAB — URINALYSIS, ROUTINE W REFLEX MICROSCOPIC
BILIRUBIN URINE: NEGATIVE
GLUCOSE, UA: NEGATIVE mg/dL
KETONES UR: NEGATIVE mg/dL
Nitrite: NEGATIVE
PROTEIN: NEGATIVE mg/dL
Specific Gravity, Urine: 1.022 (ref 1.005–1.030)
pH: 6.5 (ref 5.0–8.0)

## 2015-08-26 NOTE — ED Notes (Signed)
Pt from home c/o sharp lower abdominal pain. Pt took pregnancy test yesterday at home it was positive. Denies discharge or bleeding.

## 2015-08-26 NOTE — ED Notes (Signed)
Nurse drawing labs. 

## 2015-08-26 NOTE — ED Provider Notes (Signed)
CSN: [id number]     Arrival date & time 08/26/15  2053 History   First MD Initiated Contact with Patient 08/26/15 2123     Chief Complaint  Patient presents with  . Abdominal Pain   HPI  Ms. Kristin [name] is a 29 year old female with PMHx of kidney stones presenting with abdominal pain. Onset of pain was approximately 2 days ago. She states that the pain is intermittent and located in the left lateral and lower abdomen. The pain episodes last for 30 minutes to 1 hour before resolving on their own. She describes it as a sharp pain. The pain is associated with nausea and she has had one episode of vomiting. She is not currently experiencing the pain and states she last felt it around lunch time when it lasted for about 30 minutes. She states that this feels similar to her kidney stones in the past but she typically gets the pain in her flank. She also notes that she took 5 home pregnancy test last evening which were positive. Denies fevers, chills, headaches, dizziness, syncope, flank pain, dysuria, hematuria, vaginal bleeding or vaginal discharge.   Past Medical History  Diagnosis Date  . Knee joint pain    History reviewed. No pertinent past surgical history. Family History  Problem Relation Age of Onset  . CVA Mother   . Hypertension Mother   . Diabetes Mellitus II Mother   . Congestive Heart Failure Mother   . CVA Father   . Hypertension Father   . Diabetes Mellitus II Father    Social History  Substance Use Topics  . Smoking status: Never Smoker   . Smokeless tobacco: None  . Alcohol Use: No   OB History    No data available     Review of Systems  Gastrointestinal: Positive for nausea, vomiting and abdominal pain.  Genitourinary: Negative for dysuria, hematuria, flank pain, vaginal bleeding, vaginal discharge, difficulty urinating and pelvic pain.  All other systems reviewed and are negative.     Allergies  Amoxicillin  Home Medications   Prior to Admission medications     Medication Sig Start Date End Date Taking? Authorizing Provider  HYDROcodone-acetaminophen (NORCO) 5-325 MG per tablet Take 1-2 tablets by mouth every 4 (four) hours as needed for moderate pain. Patient taking differently: Take 1 tablet by mouth every 4 (four) hours as needed for moderate pain.  04/25/15  Yes Dione Boozeavid Glick, MD  methocarbamol (ROBAXIN) 500 MG tablet Take 1 tablet (500 mg total) by mouth every 8 (eight) hours as needed for muscle spasms. 04/30/15  Yes Raeford RazorStephen Kohut, MD   BP 115/72 mmHg  Pulse 69  Temp(Src) 98.3 F (36.8 C) (Temporal)  Resp 16  SpO2 99%  LMP 07/20/2015 Physical Exam  Constitutional: She appears well-developed and well-nourished. No distress.  HENT:  Head: Normocephalic and atraumatic.  Eyes: Conjunctivae are normal. Right eye exhibits no discharge. Left eye exhibits no discharge. No scleral icterus.  Neck: Normal range of motion.  Cardiovascular: Normal rate, regular rhythm and normal heart sounds.   Pulmonary/Chest: Effort normal and breath sounds normal. No respiratory distress. She has no wheezes. She has no rales.  Abdominal: Soft. She exhibits no distension. There is no tenderness. There is no rebound and no guarding.    Abdomen is soft, nontender. No peritoneal signs. No CVA tenderness.  Musculoskeletal: Normal range of motion. She exhibits no edema or tenderness.  Moves all extremities spontaneously and without pain  Neurological: She is alert. Coordination normal.  Skin: Skin is warm and dry.  Psychiatric: She has a normal mood and affect. Her behavior is normal.  Nursing note and vitals reviewed.   ED Course  Procedures (including critical care time) Labs Review Labs Reviewed  CBC - Abnormal; Notable for the following:    WBC 13.0 (*)    All other components within normal limits  URINALYSIS, ROUTINE W REFLEX MICROSCOPIC (NOT AT [facility]) - Abnormal; Notable for the following:    APPearance CLOUDY (*)    Hgb urine dipstick LARGE (*)     Leukocytes, UA SMALL (*)    All other components within normal limits  HCG, QUANTITATIVE, PREGNANCY - Abnormal; Notable for the following:    hCG, Beta Chain, Quant, S 448 (*)    All other components within normal limits  URINE MICROSCOPIC-ADD ON - Abnormal; Notable for the following:    Squamous Epithelial / LPF 0-5 (*)    Bacteria, UA MANY (*)    All other components within normal limits  URINE CULTURE  LIPASE, BLOOD  COMPREHENSIVE METABOLIC PANEL    Imaging Review US Renal  08/26/2015  CLINICAL DATA:  Hematuria and intermittent pelvic pain EXAM: RENAL / URINARY TRACT ULTRASOUND COMPLETE COMPARISON:  CT 12/22/2012 FINDINGS: Right Kidney: Length: 10.9 cm. Echogenicity within normal limits. No mass or hydronephrosis visualized. Left Kidney: Length: 12.1 cm. Echogenicity within normal limits. No mass or hydronephrosis. There is a 5 mm calculus in the midpole collecting system. Bladder: Appears normal for degree of bladder distention. IMPRESSION: Nephrolithiasis.  No hydronephrosis. Electronically Signed   By: Ellery Plunk M.D.   On: 08/26/2015 23:37   I have personally reviewed and evaluated these images and lab results as part of my medical decision-making.   EKG Interpretation None      MDM   Final diagnoses:  UTI (lower urinary tract infection)  Nephrolithiasis   29 year old female with PMHx of kidney stones presenting with left lateral and lower abdominal pain. Pain is intermittent and associated with nausea. Last episode was 6+ hours ago and lasted 30 minutes. VSS. Pt is nontoxic appearing. Abdomen is soft, non-tender without CVA tenderness. No peritoneal signs. WBC elevated to 13. Urine positive for TNTC RBCs and many bacteria. CMP unremarkable. HCG positive. Renal US shows a calculus in left kidney without ureteral stone in right. Consulted urology who believes her symptoms and urinalysis are consistent with a passed kidney stone. They do not recommend antibiotic therapy at  this time. If the patient's symptoms return she is to return to the emergency department for further evaluation. Will discharge with vicodin, pyridoxine and zofran. Discussed risks/benefits of vicodin and zofran during pregnancy with patient and she states understanding. Encouraged to schedule a follow up appointment with her OBGYN to establish prenatal care. Return precautions given in discharge paperwork and discussed with pt at bedside. Pt stable for discharge  Alveta Heimlich, PA-C 08/27/15 0213  Alveta Heimlich, PA-C 08/27/15 1120  Lorre Nick, MD 09/02/15 [phone]

## 2015-08-26 NOTE — ED Notes (Signed)
Bed: [id number] Expected date: 08/26/15 Expected time: 9:06 PM Means of arrival:  Comments: Hold for Triage 5

## 2015-08-27 MED ORDER — ONDANSETRON HCL 4 MG PO TABS: 4.0000 mg | ORAL_TABLET | Freq: Four times a day (QID) | ORAL | Status: DC

## 2015-08-27 MED ORDER — PYRIDOXINE HCL 500 MG PO TABS: 500.0000 mg | ORAL_TABLET | Freq: Every day | ORAL | Status: DC

## 2015-08-27 MED ORDER — HYDROCODONE-ACETAMINOPHEN 5-325 MG PO TABS: 1.0000 | ORAL_TABLET | Freq: Four times a day (QID) | ORAL | Status: DC | PRN

## 2015-08-27 NOTE — Discharge Instructions (Signed)
Kidney Stones °Kidney stones (urolithiasis) are deposits that form inside your kidneys. The intense pain is caused by the stone moving through the urinary tract. When the stone moves, the ureter goes into spasm around the stone. The stone is usually passed in the urine.  °CAUSES  °· A disorder that makes certain neck glands produce too much parathyroid hormone (primary hyperparathyroidism). °· A buildup of uric acid crystals, similar to gout in your joints. °· Narrowing (stricture) of the ureter. °· A kidney obstruction present at birth (congenital obstruction). °· Previous surgery on the kidney or ureters. °· Numerous kidney infections. °SYMPTOMS  °· Feeling sick to your stomach (nauseous). °· Throwing up (vomiting). °· Blood in the urine (hematuria). °· Pain that usually spreads (radiates) to the groin. °· Frequency or urgency of urination. °DIAGNOSIS  °· Taking a history and physical exam. °· Blood or urine tests. °· CT scan. °· Occasionally, an examination of the inside of the urinary bladder (cystoscopy) is performed. °TREATMENT  °· Observation. °· Increasing your fluid intake. °· Extracorporeal shock wave lithotripsy--This is a noninvasive procedure that uses shock waves to break up kidney stones. °· Surgery may be needed if you have severe pain or persistent obstruction. There are various surgical procedures. Most of the procedures are performed with the use of small instruments. Only small incisions are needed to accommodate these instruments, so recovery time is minimized. °The size, location, and chemical composition are all important variables that will determine the proper choice of action for you. Talk to your health care provider to better understand your situation so that you will minimize the risk of injury to yourself and your kidney.  °HOME CARE INSTRUCTIONS  °· Drink enough water and fluids to keep your urine clear or pale yellow. This will help you to pass the stone or stone fragments. °· Strain  all urine through the provided strainer. Keep all particulate matter and stones for your health care provider to see. The stone causing the pain may be as small as a grain of salt. It is very important to use the strainer each and every time you pass your urine. The collection of your stone will allow your health care provider to analyze it and verify that a stone has actually passed. The stone analysis will often identify what you can do to reduce the incidence of recurrences. °· Only take over-the-counter or prescription medicines for pain, discomfort, or fever as directed by your health care provider. °· Keep all follow-up visits as told by your health care provider. This is important. °· Get follow-up X-rays if required. The absence of pain does not always mean that the stone has passed. It may have only stopped moving. If the urine remains completely obstructed, it can cause loss of kidney function or even complete destruction of the kidney. It is your responsibility to make sure X-rays and follow-ups are completed. Ultrasounds of the kidney can show blockages and the status of the kidney. Ultrasounds are not associated with any radiation and can be performed easily in a matter of minutes. °· Make changes to your daily diet as told by your health care provider. You may be told to: °¨ Limit the amount of salt that you eat. °¨ Eat 5 or more servings of fruits and vegetables each day. °¨ Limit the amount of meat, poultry, fish, and eggs that you eat. °· Collect a 24-hour urine sample as told by your health care provider. You may need to collect another urine sample every 6-12   months. °SEEK MEDICAL CARE IF: °· You experience pain that is progressive and unresponsive to any pain medicine you have been prescribed. °SEEK IMMEDIATE MEDICAL CARE IF:  °· Pain cannot be controlled with the prescribed medicine. °· You have a fever or shaking chills. °· The severity or intensity of pain increases over 18 hours and is not  relieved by pain medicine. °· You develop a new onset of abdominal pain. °· You feel faint or pass out. °· You are unable to urinate. °  °This information is not intended to replace advice given to you by your health care provider. Make sure you discuss any questions you have with your health care provider. °  °Document Released: 09/09/2005 Document Revised: 05/31/2015 Document Reviewed: 02/10/2013 °Elsevier Interactive Patient Education ©2016 [employer] ° °

## 2015-08-29 LAB — URINE CULTURE

## 2015-08-30 NOTE — Progress Notes (Signed)
ED Antimicrobial Stewardship Positive Culture Follow Up   Kristin GlasgowLauren G Henrickson is an 29 y.o. female who [facility] with a chief complaint of  Chief Complaint  Patient presents with  . Abdominal Pain    Recent Results (from the past 720 hour(s))  Urine culture     Status: None   Collection Time: 08/26/15 12:48 AM  Result Value Ref Range Status   Specimen Description URINE, CLEAN CATCH  Final   Special Requests NONE  Final   Culture   Final    >=100,000 COLONIES/mL STAPHYLOCOCCUS SPECIES (COAGULASE NEGATIVE) Performed at [facility]    Report Status 08/29/2015 FINAL  Final   Organism ID, Bacteria STAPHYLOCOCCUS SPECIES (COAGULASE NEGATIVE)  Final      Susceptibility   Staphylococcus species (coagulase negative) - MIC*    CIPROFLOXACIN <=0.5 SENSITIVE Sensitive     GENTAMICIN <=0.5 SENSITIVE Sensitive     NITROFURANTOIN <=16 SENSITIVE Sensitive     OXACILLIN 1 SENSITIVE Sensitive     TETRACYCLINE <=1 SENSITIVE Sensitive     VANCOMYCIN <=0.5 SENSITIVE Sensitive     TRIMETH/SULFA <=10 SENSITIVE Sensitive     CLINDAMYCIN <=0.25 RESISTANT Resistant     RIFAMPIN <=0.5 SENSITIVE Sensitive     Inducible Clindamycin POSITIVE Resistant     * >=100,000 COLONIES/mL STAPHYLOCOCCUS SPECIES (COAGULASE NEGATIVE)   [x]  Patient discharged originally without antimicrobial agent and treatment is now indicated  Patient is pregnant and has asymptomatic bacteruria, which is an indication for treatment  New antibiotic prescription: Amoxicillin 500 mg BID x 1 week  ED Provider: Everlene FarrierWilliam Dansie, PA-C  Bertram MillardMichael A Hoyt Leanos 08/30/2015, 8:11 AM Infectious Diseases Pharmacist Phone# [phone]

## 2015-08-31 ENCOUNTER — Telehealth (HOSPITAL_BASED_OUTPATIENT_CLINIC_OR_DEPARTMENT_OTHER): Payer: Self-pay | Admitting: Emergency Medicine

## 2015-08-31 NOTE — Telephone Encounter (Signed)
Post ED Visit - Positive Culture Follow-up: Successful Patient Follow-Up  Culture assessed and recommendations reviewed by: []  Enzo BiNathan Batchelder, Pharm.D. []  Celedonio MiyamotoJeremy Frens, Pharm.D., BCPS [x]  Garvin FilaMike Maccia, Pharm.D. []  Georgina PillionElizabeth Martin, Pharm.D., BCPS []  ClarksvilleMinh Pham, [address]Pharm.D., BCPS, AAHIVP []  Estella HuskMichelle Turner, Pharm.D., BCPS, AAHIVP []  Tennis Mustassie Stewart, Pharm.D. []  Sherle Poeob Vincent, [address]Pharm[address]D.  Positive urine  Culture Staph  [x]  Patient discharged without antimicrobial prescription and treatment is now indicated []  Organism is resistant to prescribed ED discharge antimicrobial []  Patient with positive blood cultures  Changes discussed with ED provider: Everlene FarrierWilliam Dansie PA New antibiotic prescription start Bactrim DS 1 tab po bid x 3 days  Attempting to contact patient   Berle MullMiller, Bennett Ram 08/31/2015, 3:55 PM

## 2015-09-01 ENCOUNTER — Telehealth (HOSPITAL_COMMUNITY): Payer: Self-pay

## 2015-09-24 NOTE — L&D Delivery Note (Signed)
Delivery Note At 7:01 AM a viable female "Kristin [name]" was delivered via (Presentation: Direct Occiput Anterior).APGARS: ; weight pending.  Placenta status: Delivered intact w/ maternal effort. Cord: 3 vessaels with the following complaications: None.Cord pH: N/A.  Anesthesia: Epidural  Episiotomy: NA Lacerations: Tiny abrasion inside vagina that required one interrupted due to ongoing bleeding despite application of direct pressure. Suture Repair: 4.0 Vicryl SH Est. Blood Loss (mL): 200  Mom to postpartum. Baby to Couplet care / Skin to Skin.  Mom plans to breastfeed.  Undecided re: birth control.   Sherre Scarlet, CNM 04/15/16, 7:40 AM

## 2015-09-26 ENCOUNTER — Telehealth (HOSPITAL_COMMUNITY): Payer: Self-pay

## 2015-09-26 NOTE — Telephone Encounter (Signed)
Unable to reach by phone or mail.  Chart closed.   

## 2015-10-09 LAB — HM PAP SMEAR

## 2015-10-26 DIAGNOSIS — R87629 Unspecified abnormal cytological findings in specimens from vagina: Secondary | ICD-10-CM | POA: Diagnosis not present

## 2015-10-26 DIAGNOSIS — O3660X0 Maternal care for excessive fetal growth, unspecified trimester, not applicable or unspecified: Secondary | ICD-10-CM | POA: Insufficient documentation

## 2015-11-09 DIAGNOSIS — R87610 Atypical squamous cells of undetermined significance on cytologic smear of cervix (ASC-US): Secondary | ICD-10-CM | POA: Insufficient documentation

## 2016-03-01 ENCOUNTER — Encounter (HOSPITAL_COMMUNITY): Payer: Self-pay

## 2016-03-01 ENCOUNTER — Inpatient Hospital Stay (HOSPITAL_COMMUNITY)
Admission: AD | Admit: 2016-03-01 | Discharge: 2016-03-01 | Disposition: A | Discharge status: Home / Self Care | Payer: Managed Care, Other (non HMO) | Source: Ambulatory Visit | Attending: Obstetrics and Gynecology | Admitting: Obstetrics and Gynecology

## 2016-03-01 DIAGNOSIS — Z823 Family history of stroke: Secondary | ICD-10-CM | POA: Diagnosis not present

## 2016-03-01 DIAGNOSIS — Z880 Allergy status to penicillin: Secondary | ICD-10-CM

## 2016-03-01 DIAGNOSIS — Z3A31 31 weeks gestation of pregnancy: Secondary | ICD-10-CM | POA: Diagnosis not present

## 2016-03-01 DIAGNOSIS — Z87442 Personal history of urinary calculi: Secondary | ICD-10-CM | POA: Insufficient documentation

## 2016-03-01 DIAGNOSIS — R87629 Unspecified abnormal cytological findings in specimens from vagina: Secondary | ICD-10-CM | POA: Diagnosis not present

## 2016-03-01 DIAGNOSIS — IMO0002 Reserved for concepts with insufficient information to code with codable children: Secondary | ICD-10-CM | POA: Diagnosis not present

## 2016-03-01 HISTORY — DX: Syncope and collapse: R55

## 2016-03-01 HISTORY — DX: Allergy status to penicillin: Z88.0

## 2016-03-01 HISTORY — DX: Calculus of kidney: N20.0

## 2016-03-01 LAB — WET PREP, GENITAL
SPERM: NONE SEEN
Trich, Wet Prep: NONE SEEN
Yeast Wet Prep HPF POC: NONE SEEN

## 2016-03-01 LAB — URINALYSIS, ROUTINE W REFLEX MICROSCOPIC
Bilirubin Urine: NEGATIVE
Glucose, UA: NEGATIVE mg/dL
KETONES UR: 15 mg/dL — AB
NITRITE: NEGATIVE
PROTEIN: NEGATIVE mg/dL
Specific Gravity, Urine: 1.015 (ref 1.005–1.030)
pH: 6.5 (ref 5.0–8.0)

## 2016-03-01 LAB — URINE MICROSCOPIC-ADD ON

## 2016-03-01 LAB — FETAL FIBRONECTIN: Fetal Fibronectin: NEGATIVE

## 2016-03-01 MED ORDER — NIFEDIPINE 10 MG PO CAPS: 10.0000 mg | ORAL_CAPSULE | Freq: Four times a day (QID) | ORAL | Status: DC | PRN

## 2016-03-01 MED ORDER — NIFEDIPINE 10 MG PO CAPS
10.0000 mg | ORAL_CAPSULE | Freq: Once | ORAL | Status: AC
  Administered 2016-03-01: 10 mg via ORAL
  Filled 2016-03-01: qty 1

## 2016-03-01 NOTE — MAU Note (Signed)
Has been feeling contractions for the last two weeks, has been having increasing vaginal pressure. Contractions have not been time, have been sporadic.

## 2016-03-01 NOTE — Discharge Instructions (Signed)

## 2016-03-01 NOTE — MAU Provider Note (Signed)
History   30 yo G4P1021 at 69 2/7 weeks presented after calling office with report of contractions and pelvic pressure over last several days.  Denies leaking or bleeding, reports +FM.  No dysuria, N/V, or recent IC.  Patient Active Problem List   Diagnosis Date Noted  . Syncope 08/21/2015  . Syncope and collapse 08/21/2015  . Hypokalemia 08/21/2015  . Anxiety 08/21/2015    Chief Complaint  Patient presents with  . Contractions   HPI  OB History    Gravida Para Term Preterm AB TAB SAB Ectopic Multiple Living   Past Medical History  Diagnosis Date  . Knee joint pain   . Syncope   . Kidney stones     History reviewed. No pertinent past surgical history.  Family History  Problem Relation Age of Onset  . CVA Mother   . Hypertension Mother   . Diabetes Mellitus II Mother   . Congestive Heart Failure Mother   . CVA Father   . Hypertension Father   . Diabetes Mellitus II Father     Social History  Substance Use Topics  . Smoking status: Never Smoker   . Smokeless tobacco: None  . Alcohol Use: No    Allergies:  Allergies  Allergen Reactions  . Amoxicillin Hives    Has patient had a PCN reaction causing immediate rash, facial/tongue/throat swelling, SOB or lightheadedness with hypotension: Yes Has patient had a PCN reaction causing severe rash involving mucus membranes or skin necrosis: No Has patient had a PCN reaction that required hospitalization No Has patient had a PCN reaction occurring within the last 10 years: No If all of the above answers are "NO", then may proceed with Cephalosporin use.    Prescriptions prior to admission  Medication Sig Dispense Refill Last Dose  . HYDROcodone-acetaminophen (NORCO) 5-325 MG per tablet Take 1-2 tablets by mouth every 4 (four) hours as needed for moderate pain. (Patient taking differently: Take 1 tablet by mouth every 4 (four) hours as needed for moderate pain. ) 20 tablet 0 Past Month at Unknown  time  . HYDROcodone-acetaminophen (NORCO/VICODIN) 5-325 MG tablet Take 1 tablet by mouth every 6 (six) hours as needed. 6 tablet 0   . methocarbamol (ROBAXIN) 500 MG tablet Take 1 tablet (500 mg total) by mouth every 8 (eight) hours as needed for muscle spasms. 12 tablet 0 Past Month at Unknown time  . ondansetron (ZOFRAN) 4 MG tablet Take 1 tablet (4 mg total) by mouth every 6 (six) hours. 6 tablet 0     ROS:  Cramping/contractions, +FM Physical Exam   Blood pressure 142/65, pulse 97, temperature 98.9 F (37.2 C), temperature source Oral, height  (1.651 m), weight 113.853 kg (251 lb), last menstrual period 07/24/2015.  Filed Vitals:   03/01/16 1501 03/01/16 1659  BP: 142/65 132/62  Pulse: 97 91  Temp: 98.9 F (37.2 C) 98 F (36.7 C)  TempSrc: Oral Oral  Resp:  18  Height:  (1.651 m)   Weight: 113.853 kg (251 lb)     Physical Exam  In NAD Chest clear Heart RRR without murmur Abd gravid, NT Pelvic--cervix closed, long, firm, pp OOP Ext WNL  FHR Category 1 UCs irregular, mild, 4-5 in first hour.   ED Course  Assessment: IUP at 31 2/7 weeks Preterm UCs without cervical change  Plan: FFN, GBS, wet prep, cultures UA Procardia 10 mg po now.  PO hydration   Ray ChurchLATHAM, Garlene Apperson CNM, MSN 03/01/2016 3:51 PM   Addendum: Patient not aware of any UCs now.  FHR Category 1 No defined UCs, occasional mild irritability.  Results for orders placed or performed during the hospital encounter of 03/01/16 (from the past 24 hour(s))  Urinalysis, Routine w reflex microscopic (not at [facility])     Status: Abnormal   Collection Time: 03/01/16  2:45 PM  Result Value Ref Range   Color, Urine YELLOW YELLOW   APPearance HAZY (A) CLEAR   Specific Gravity, Urine 1.015 1.005 - 1.030   pH 6.5 5.0 - 8.0   Glucose, UA NEGATIVE NEGATIVE mg/dL   Hgb urine dipstick SMALL (A) NEGATIVE   Bilirubin Urine NEGATIVE NEGATIVE   Ketones, ur 15 (A) NEGATIVE mg/dL   Protein, ur NEGATIVE NEGATIVE  mg/dL   Nitrite NEGATIVE NEGATIVE   Leukocytes, UA MODERATE (A) NEGATIVE  Urine microscopic-add on     Status: Abnormal   Collection Time: 03/01/16  2:45 PM  Result Value Ref Range   Squamous Epithelial / LPF 0-5 (A) NONE SEEN   WBC, UA 0-5 0 - 5 WBC/hpf   RBC / HPF 0-5 0 - 5 RBC/hpf   Bacteria, UA RARE (A) NONE SEEN  Fetal fibronectin     Status: None   Collection Time: 03/01/16  3:43 PM  Result Value Ref Range   Fetal Fibronectin NEGATIVE NEGATIVE  Wet prep, genital     Status: Abnormal   Collection Time: 03/01/16  3:43 PM  Result Value Ref Range   Yeast Wet Prep HPF POC NONE SEEN NONE SEEN   Trich, Wet Prep NONE SEEN NONE SEEN   Clue Cells Wet Prep HPF POC PRESENT (A) NONE SEEN   WBC, Wet Prep HPF POC MANY (A) NONE SEEN   Sperm NONE SEEN    D/C'd home with PTL precautions. Rx Procardia 10 mg po q 6 hours prn UCs, #36, no refills. Keep scheduled appt at [facility], call prn.  Nigel BridgemanVicki Rodina Pinales, CNM 03/01/16 4p

## 2016-03-02 LAB — CULTURE, BETA STREP (GROUP B ONLY)

## 2016-03-02 LAB — HIV ANTIBODY (ROUTINE TESTING W REFLEX): HIV Screen 4th Generation wRfx: NONREACTIVE

## 2016-03-02 NOTE — MAU Note (Signed)
[facility] at 1438.  Discussed with Venia CarbonJennifer Rasch, NP who explained it will be follow up with during her regular prenatal care.

## 2016-03-04 DIAGNOSIS — Z22330 Carrier of Group B streptococcus: Secondary | ICD-10-CM | POA: Insufficient documentation

## 2016-03-04 LAB — GC/CHLAMYDIA PROBE AMP (~~LOC~~) NOT AT ARMC
Chlamydia: NEGATIVE
Neisseria Gonorrhea: NEGATIVE

## 2016-03-23 ENCOUNTER — Inpatient Hospital Stay (HOSPITAL_COMMUNITY)
Admission: AD | Admit: 2016-03-23 | Discharge: 2016-03-24 | Disposition: A | Discharge status: Home / Self Care | Payer: Managed Care, Other (non HMO) | Source: Ambulatory Visit | Attending: Obstetrics and Gynecology | Admitting: Obstetrics and Gynecology

## 2016-03-23 ENCOUNTER — Other Ambulatory Visit: Payer: Self-pay | Admitting: Obstetrics and Gynecology

## 2016-03-23 DIAGNOSIS — Z3A34 34 weeks gestation of pregnancy: Secondary | ICD-10-CM | POA: Insufficient documentation

## 2016-03-23 DIAGNOSIS — B951 Streptococcus, group B, as the cause of diseases classified elsewhere: Secondary | ICD-10-CM | POA: Diagnosis present

## 2016-03-23 DIAGNOSIS — Z87442 Personal history of urinary calculi: Secondary | ICD-10-CM | POA: Insufficient documentation

## 2016-03-23 DIAGNOSIS — O99820 Streptococcus B carrier state complicating pregnancy: Secondary | ICD-10-CM | POA: Insufficient documentation

## 2016-03-23 DIAGNOSIS — Z880 Allergy status to penicillin: Secondary | ICD-10-CM | POA: Insufficient documentation

## 2016-03-23 DIAGNOSIS — Z8759 Personal history of other complications of pregnancy, childbirth and the puerperium: Secondary | ICD-10-CM

## 2016-03-23 DIAGNOSIS — F419 Anxiety disorder, unspecified: Secondary | ICD-10-CM | POA: Insufficient documentation

## 2016-03-23 DIAGNOSIS — O26893 Other specified pregnancy related conditions, third trimester: Secondary | ICD-10-CM | POA: Insufficient documentation

## 2016-03-23 DIAGNOSIS — R42 Dizziness and giddiness: Secondary | ICD-10-CM | POA: Insufficient documentation

## 2016-03-23 DIAGNOSIS — N200 Calculus of kidney: Secondary | ICD-10-CM

## 2016-03-23 DIAGNOSIS — O99343 Other mental disorders complicating pregnancy, third trimester: Secondary | ICD-10-CM | POA: Insufficient documentation

## 2016-03-23 HISTORY — DX: Gestational (pregnancy-induced) hypertension without significant proteinuria, unspecified trimester: O13.9

## 2016-03-24 ENCOUNTER — Encounter (HOSPITAL_COMMUNITY): Payer: Self-pay

## 2016-03-24 ENCOUNTER — Telehealth: Payer: Self-pay | Admitting: Advanced Practice Midwife

## 2016-03-24 DIAGNOSIS — Z8759 Personal history of other complications of pregnancy, childbirth and the puerperium: Secondary | ICD-10-CM

## 2016-03-24 DIAGNOSIS — Z87442 Personal history of urinary calculi: Secondary | ICD-10-CM | POA: Diagnosis not present

## 2016-03-24 DIAGNOSIS — B951 Streptococcus, group B, as the cause of diseases classified elsewhere: Secondary | ICD-10-CM

## 2016-03-24 DIAGNOSIS — N200 Calculus of kidney: Secondary | ICD-10-CM

## 2016-03-24 DIAGNOSIS — Z3A34 34 weeks gestation of pregnancy: Secondary | ICD-10-CM | POA: Diagnosis not present

## 2016-03-24 DIAGNOSIS — O26893 Other specified pregnancy related conditions, third trimester: Secondary | ICD-10-CM | POA: Diagnosis not present

## 2016-03-24 DIAGNOSIS — R42 Dizziness and giddiness: Secondary | ICD-10-CM | POA: Diagnosis present

## 2016-03-24 DIAGNOSIS — F419 Anxiety disorder, unspecified: Secondary | ICD-10-CM | POA: Diagnosis not present

## 2016-03-24 DIAGNOSIS — O99820 Streptococcus B carrier state complicating pregnancy: Secondary | ICD-10-CM | POA: Diagnosis not present

## 2016-03-24 DIAGNOSIS — O99343 Other mental disorders complicating pregnancy, third trimester: Secondary | ICD-10-CM | POA: Diagnosis not present

## 2016-03-24 DIAGNOSIS — Z880 Allergy status to penicillin: Secondary | ICD-10-CM | POA: Diagnosis not present

## 2016-03-24 HISTORY — DX: Streptococcus, group b, as the cause of diseases classified elsewhere: B95.1

## 2016-03-24 HISTORY — DX: Personal history of other complications of pregnancy, childbirth and the puerperium: Z87.59

## 2016-03-24 LAB — COMPREHENSIVE METABOLIC PANEL
ALBUMIN: 3.1 g/dL — AB (ref 3.5–5.0)
ALT: 15 U/L (ref 14–54)
ANION GAP: 7 (ref 5–15)
AST: 18 U/L (ref 15–41)
Alkaline Phosphatase: 127 U/L — ABNORMAL HIGH (ref 38–126)
BUN: 10 mg/dL (ref 6–20)
CO2: 21 mmol/L — AB (ref 22–32)
Calcium: 9.5 mg/dL (ref 8.9–10.3)
Chloride: 106 mmol/L (ref 101–111)
Creatinine, Ser: 0.47 mg/dL (ref 0.44–1.00)
GFR calc non Af Amer: >60 mL/min (ref >60)
GLUCOSE: 88 mg/dL (ref 65–99)
POTASSIUM: 3.6 mmol/L (ref 3.5–5.1)
SODIUM: 134 mmol/L — AB (ref 135–145)
Total Bilirubin: 0.3 mg/dL (ref 0.3–1.2)
Total Protein: 6.7 g/dL (ref 6.5–8.1)

## 2016-03-24 LAB — LACTATE DEHYDROGENASE: LDH: 132 U/L (ref 98–192)

## 2016-03-24 LAB — URINE MICROSCOPIC-ADD ON

## 2016-03-24 LAB — URINALYSIS, ROUTINE W REFLEX MICROSCOPIC
BILIRUBIN URINE: NEGATIVE
GLUCOSE, UA: NEGATIVE mg/dL
Ketones, ur: NEGATIVE mg/dL
Nitrite: NEGATIVE
PROTEIN: NEGATIVE mg/dL
pH: 6 (ref 5.0–8.0)

## 2016-03-24 LAB — TSH: TSH: 2.964 u[IU]/mL (ref 0.350–4.500)

## 2016-03-24 LAB — PROTEIN / CREATININE RATIO, URINE
CREATININE, URINE: 155 mg/dL
PROTEIN CREATININE RATIO: 0.15 mg/mg{creat} (ref 0.00–0.15)
TOTAL PROTEIN, URINE: 23 mg/dL

## 2016-03-24 LAB — CBC
HCT: 34.4 % — ABNORMAL LOW (ref 36.0–46.0)
Hemoglobin: 11.6 g/dL — ABNORMAL LOW (ref 12.0–15.0)
MCH: 28.4 pg (ref 26.0–34.0)
MCHC: 33.7 g/dL (ref 30.0–36.0)
MCV: 84.3 fL (ref 78.0–100.0)
PLATELETS: 224 10*3/uL (ref 150–400)
RBC: 4.08 MIL/uL (ref 3.87–5.11)
RDW: 14.5 % (ref 11.5–15.5)
WBC: 16.1 10*3/uL — ABNORMAL HIGH (ref 4.0–10.5)

## 2016-03-24 LAB — URIC ACID: URIC ACID, SERUM: 3.6 mg/dL (ref 2.3–6.6)

## 2016-03-24 MED ORDER — MECLIZINE HCL 32 MG PO TABS: 32.0000 mg | ORAL_TABLET | Freq: Three times a day (TID) | ORAL | Status: DC | PRN

## 2016-03-24 MED ORDER — MECLIZINE HCL 25 MG PO TABS
50.0000 mg | ORAL_TABLET | ORAL | Status: AC
  Administered 2016-03-24: 50 mg via ORAL
  Filled 2016-03-24: qty 2

## 2016-03-24 NOTE — MAU Note (Signed)
Dizzy since 3 pm yesterday (7-1).  Blood pressure was 148/98. Headache since arriving here.  No bleeding. Baby moving a lot earlier not as much now. No leaking.

## 2016-03-24 NOTE — [facility] Provider Note (Signed)
History   30 yo [id number] at 8234 4/7 weeks presented after calling with dizziness since 3pm yesterday, some vertigo.  Took BP at home, reported as 150/96.  Denies HA, visual sx, or epigastric pain.  Denies vomiting or diarrhea, reports +FM.  Hx of syncopal episodes in past, with negative neuro w/u 2016, normal echo.  Patient Active Problem List   Diagnosis Date Noted  . Kidney stones--hx  03/24/2016  . History of pregnancy induced hypertension--prior pregnancy 03/24/2016  . Dizziness 03/24/2016  . Positive GBS test 03/24/2016  . LGA (large for gestational age) fetus--hx with 1st pregnancy, 9+6 03/01/2016  . Allergy to amoxicillin 03/01/2016  . 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 Syncope 08/21/2015  . Hypokalemia 08/21/2015  . Anxiety 08/21/2015    Chief Complaint  Patient presents with  . Dizziness   HPI:  As above  OB History    Gravida Para Term Preterm AB TAB SAB Ectopic Multiple Living   4 1 1  0 2  2   1       Past Medical History  Diagnosis Date  . Knee joint pain   . Syncope   . Kidney stones   . Pregnancy induced hypertension     Past Surgical History  Procedure Laterality Date  . Kidney stone surgery      Family History  Problem Relation Age of Onset  . CVA Mother   . Hypertension Mother   . Diabetes Mellitus II Mother   . Congestive Heart Failure Mother   . CVA Father   . Hypertension Father   . Diabetes Mellitus II Father     Social History  Substance Use Topics  . Smoking status: Never Smoker   . Smokeless tobacco: None  . Alcohol Use: No    Allergies:  Allergies  Allergen Reactions  . Amoxicillin Hives    Has patient had a PCN reaction causing immediate rash, facial/tongue/throat swelling, SOB or lightheadedness with hypotension: no Has patient had a PCN reaction causing severe rash involving mucus membranes or skin necrosis: No Has patient had a PCN reaction that required hospitalization No Has patient had a  PCN reaction occurring within the last 10 years: No If all of the above answers are "NO", then may proceed with Cephalosporin use.    Prescriptions prior to admission  Medication Sig Dispense Refill Last Dose  . calcium carbonate (TUMS - DOSED IN MG ELEMENTAL CALCIUM) 500 MG chewable tablet Chew 2 tablets by mouth daily as needed for indigestion or heartburn.   Past Month at Unknown time  . NIFEdipine (PROCARDIA) 10 MG capsule Take 1 capsule (10 mg total) by mouth 4 (four) times daily as needed. 30 capsule 1 03/23/2016 at 1430  . Prenatal Vit-Fe Fumarate-FA (PRENATAL MULTIVITAMIN) TABS tablet Take 1 tablet by mouth daily at 12 noon.   03/23/2016 at Unknown time  . Pyridoxine HCl (B-6 PO) Take 1 tablet by mouth daily.   Past Week at Unknown time    ROS:  Dizziness,+FM, occasional cramping Physical Exam   Blood pressure 119/57, pulse 70, temperature 97.9 F (36.6 C), temperature source Oral, resp. rate 18, last menstrual period 07/24/2015, SpO2 99 %.   Physical Exam  Flat affect, but alert/oriented. PERRL Ears--drums appear slightly full bilaterally, but WNL. Throat clear Chest clear Heart RRR without murmur Negative CVAT Abd gravid, NT Pelvic--cervix closed, 50%, vtx, -2 (no change from prior exams) Ext DTR 1+, no clonus, 1+ edema  FHR Category 1 UCs mild irritability  ED Course  Assessment: IUP at 34 4/7 weeks Dizziness Single elevated BP at home, normotensive here. GBS positive  Plan: UA CBC, CMP, LDH, uric acid Meclizine po now.   Nigel Bridgeman CNM, MSN 03/24/2016 2:15 AM   Addendum: Feeling some better after Meclizine at 0139  Filed Vitals:   03/24/16 0134 03/24/16 0139 03/24/16 0145 03/24/16 0200  BP:   127/63 119/57  Pulse: 79 82 74 70  Temp:      TempSrc:      Resp:      SpO2: 98% 99%     Results for orders placed or performed during the hospital encounter of 03/23/16 (from the past 24 hour(s))  Protein / creatinine ratio, urine     Status: None    Collection Time: 03/24/16 12:05 AM  Result Value Ref Range   Creatinine, Urine 155.00 mg/dL   Total Protein, Urine 23 mg/dL   Protein Creatinine Ratio 0.15 0.00 - 0.15 mg/mg[Cre]  Urinalysis, Routine w reflex microscopic (not at [facility])     Status: Abnormal   Collection Time: 03/24/16 12:05 AM  Result Value Ref Range   Color, Urine YELLOW YELLOW   APPearance CLEAR CLEAR   Specific Gravity, Urine >1.030 (H) 1.005 - 1.030   pH 6.0 5.0 - 8.0   Glucose, UA NEGATIVE NEGATIVE mg/dL   Hgb urine dipstick LARGE (A) NEGATIVE   Bilirubin Urine NEGATIVE NEGATIVE   Ketones, ur NEGATIVE NEGATIVE mg/dL   Protein, ur NEGATIVE NEGATIVE mg/dL   Nitrite NEGATIVE NEGATIVE   Leukocytes, UA SMALL (A) NEGATIVE  Urine microscopic-add on     Status: Abnormal   Collection Time: 03/24/16 12:05 AM  Result Value Ref Range   Squamous Epithelial / LPF 0-5 (A) NONE SEEN   WBC, UA 6-30 0 - 5 WBC/hpf   RBC / HPF 6-30 0 - 5 RBC/hpf   Bacteria, UA RARE (A) NONE SEEN   Crystals CA OXALATE CRYSTALS (A) NEGATIVE   Urine-Other MUCOUS PRESENT   CBC     Status: Abnormal   Collection Time: 03/24/16 12:17 AM  Result Value Ref Range   WBC 16.1 (H) 4.0 - 10.5 K/uL   RBC 4.08 3.87 - 5.11 MIL/uL   Hemoglobin 11.6 (L) 12.0 - 15.0 g/dL   HCT 40.9 (L) 81.1 - 91.4 %   MCV 84.3 78.0 - 100.0 fL   MCH 28.4 26.0 - 34.0 pg   MCHC 33.7 30.0 - 36.0 g/dL   RDW 78.2 95.6 - 21.3 %   Platelets 224 150 - 400 K/uL  Comprehensive metabolic panel     Status: Abnormal   Collection Time: 03/24/16 12:17 AM  Result Value Ref Range   Sodium 134 (L) 135 - 145 mmol/L   Potassium 3.6 3.5 - 5.1 mmol/L   Chloride 106 101 - 111 mmol/L   CO2 21 (L) 22 - 32 mmol/L   Glucose, Bld 88 65 - 99 mg/dL   BUN 10 6 - 20 mg/dL   Creatinine, Ser 0.86 0.44 - 1.00 mg/dL   Calcium 9.5 8.9 - 57.8 mg/dL   Total Protein 6.7 6.5 - 8.1 g/dL   Albumin 3.1 (L) 3.5 - 5.0 g/dL   AST 18 15 - 41 U/L   ALT 15 14 - 54 U/L   Alkaline Phosphatase 127 (H) 38 - 126 U/L    Total Bilirubin 0.3 0.3 - 1.2 mg/dL   GFR calc non Af Amer >60 >60 mL/min   GFR calc Af Amer >60 >60 mL/min  Anion gap 7 5 - 15  Lactate dehydrogenase     Status: None   Collection Time: 03/24/16 12:17 AM  Result Value Ref Range   LDH 132 98 - 192 U/L  TSH     Status: None   Collection Time: 03/24/16 12:17 AM  Result Value Ref Range   TSH 2.964 0.350 - 4.500 uIU/mL  Uric acid     Status: None   Collection Time: 03/24/16 12:17 AM  Result Value Ref Range   Uric Acid, Serum 3.6 2.3 - 6.6 mg/dL   Urine culture added  Impression: IUP at 34 4/7 weeks Dizziness/vertigo of unknown cause  Plan: D/C home with instructions to f/u with any worsening of sx, push fluids Rx Meclizine 50 mg po TID prn, #30, 1 refill. [facility]. If not feeling better, may need note for work after that 8:30am [facility] appt. Refer back to neurology if vertigo persists.  Nigel BridgemanVicki Saina Waage, CNM 03/24/16 [phone]

## 2016-03-24 NOTE — Discharge Instructions (Signed)
Vertigo Vertigo means you feel like you or your surroundings are moving when they are not. Vertigo can be dangerous if it occurs when you are at work, driving, or performing difficult activities.  CAUSES  Vertigo occurs when there is a conflict of signals sent to your brain from the visual and sensory systems in your body. There are many different causes of vertigo, including:  Infections, especially in the inner ear.  A bad reaction to a drug or misuse of alcohol and medicines.  Withdrawal from drugs or alcohol.  Rapidly changing positions, such as lying down or rolling over in bed.  A migraine headache.  Decreased blood flow to the brain.  Increased pressure in the brain from a head injury, infection, tumor, or bleeding. SYMPTOMS  You may feel as though the world is spinning around or you are falling to the ground. Because your balance is upset, vertigo can cause nausea and vomiting. You may have involuntary eye movements (nystagmus). DIAGNOSIS  Vertigo is usually diagnosed by physical exam. TREATMENT  Most cases of vertigo resolve on their own, without treatment. Depending on the cause, your caregiver may prescribe certain medicines. If your vertigo is related to body position issues, your caregiver may recommend movements or procedures to correct the problem. In rare cases, if your vertigo is caused by certain inner ear problems, you may need surgery. HOME CARE INSTRUCTIONS   Follow your caregiver's instructions.  Avoid driving.  Avoid operating heavy machinery.  Avoid performing any tasks that would be dangerous to you or others during a vertigo episode.  Tell your caregiver if you notice that certain medicines seem to be causing your vertigo. Some of the medicines used to treat vertigo episodes can actually make them worse in some people. SEEK IMMEDIATE MEDICAL CARE IF:   Your medicines do not relieve your vertigo or are making it worse.  You develop problems with  talking, walking, weakness, or using your arms, hands, or legs.  You develop severe headaches.  Your nausea or vomiting continues or gets worse.  You develop visual changes.  A family member notices behavioral changes.  Your condition gets worse. MAKE SURE YOU:  Understand these instructions.  Will watch your condition.  Will get help right away if you are not doing well or get worse.   This information is not intended to replace advice given to you by your health care provider. Make sure you discuss any questions you have with your health care provider.   Document Released: 06/19/2005 Document Revised: 12/02/2011 Document Reviewed: 01/02/2015 Elsevier Interactive Patient Education [employer]2016 Else[employer] Inc[employer]

## 2016-03-24 NOTE — Telephone Encounter (Signed)
Call from [facility] pharmacy RE: 32 mg Meclizine dose not available. Per Illene BolusLori Clemmons, CNM may switch dose. Original prescription was for 32 mg by mouth 3 times a day. Will change to 25 mg by mouth 4 times a day.

## 2016-03-26 LAB — CULTURE, OB URINE

## 2016-04-02 ENCOUNTER — Encounter (HOSPITAL_COMMUNITY): Payer: Self-pay

## 2016-04-02 ENCOUNTER — Inpatient Hospital Stay (HOSPITAL_COMMUNITY)
Admission: AD | Admit: 2016-04-02 | Discharge: 2016-04-02 | Disposition: A | Discharge status: Home / Self Care | Payer: Managed Care, Other (non HMO) | Source: Ambulatory Visit | Attending: Obstetrics and Gynecology | Admitting: Obstetrics and Gynecology

## 2016-04-02 DIAGNOSIS — O26893 Other specified pregnancy related conditions, third trimester: Secondary | ICD-10-CM | POA: Diagnosis not present

## 2016-04-02 DIAGNOSIS — R42 Dizziness and giddiness: Secondary | ICD-10-CM | POA: Insufficient documentation

## 2016-04-02 DIAGNOSIS — Z87442 Personal history of urinary calculi: Secondary | ICD-10-CM | POA: Diagnosis not present

## 2016-04-02 DIAGNOSIS — O26899 Other specified pregnancy related conditions, unspecified trimester: Secondary | ICD-10-CM

## 2016-04-02 DIAGNOSIS — Z833 Family history of diabetes mellitus: Secondary | ICD-10-CM | POA: Diagnosis not present

## 2016-04-02 DIAGNOSIS — Z3A35 35 weeks gestation of pregnancy: Secondary | ICD-10-CM | POA: Diagnosis not present

## 2016-04-02 DIAGNOSIS — R109 Unspecified abdominal pain: Secondary | ICD-10-CM | POA: Insufficient documentation

## 2016-04-02 DIAGNOSIS — Z823 Family history of stroke: Secondary | ICD-10-CM | POA: Diagnosis not present

## 2016-04-02 DIAGNOSIS — Z8249 Family history of ischemic heart disease and other diseases of the circulatory system: Secondary | ICD-10-CM | POA: Insufficient documentation

## 2016-04-02 NOTE — Discharge Instructions (Signed)
Fetal Movement Counts Patient Name: __________________________________________________ Patient Due Date: ____________________ Performing a fetal movement count is highly recommended in high-risk pregnancies, but it is good for every pregnant woman to do. Your health care provider may ask you to start counting fetal movements at 28 weeks of the pregnancy. Fetal movements often increase:  After eating a full meal.  After physical activity.  After eating or drinking something sweet or cold.  At rest. Pay attention to when you feel the baby is most active. This will help you notice a pattern of your baby's sleep and wake cycles and what factors contribute to an increase in fetal movement. It is important to perform a fetal movement count at the same time each day when your baby is normally most active.  HOW TO COUNT FETAL MOVEMENTS 1. Find a quiet and comfortable area to sit or lie down on your left side. Lying on your left side provides the best blood and oxygen circulation to your baby. 2. Write down the day and time on a sheet of paper or in a journal. 3. Start counting kicks, flutters, swishes, rolls, or jabs in a 2-hour period. You should feel at least 10 movements within 2 hours. 4. If you do not feel 10 movements in 2 hours, wait 2-3 hours and count again. Look for a change in the pattern or not enough counts in 2 hours. SEEK MEDICAL CARE IF:  You feel less than 10 counts in 2 hours, tried twice.  There is no movement in over an hour.  The pattern is changing or taking longer each day to reach 10 counts in 2 hours.  You feel the baby is not moving as he or she usually does. Date: ____________ Movements: ____________ Start time: ____________ Finish time: ____________  Date: ____________ Movements: ____________ Start time: ____________ Finish time: ____________ Date: ____________ Movements: ____________ Start time: ____________ Finish time: ____________ Date: ____________ Movements:  ____________ Start time: ____________ Finish time: ____________ Date: ____________ Movements: ____________ Start time: ____________ Finish time: ____________ Date: ____________ Movements: ____________ Start time: ____________ Finish time: ____________ Date: ____________ Movements: ____________ Start time: ____________ Finish time: ____________ Date: ____________ Movements: ____________ Start time: ____________ Finish time: ____________  Date: ____________ Movements: ____________ Start time: ____________ Finish time: ____________ Date: ____________ Movements: ____________ Start time: ____________ Finish time: ____________ Date: ____________ Movements: ____________ Start time: ____________ Finish time: ____________ Date: ____________ Movements: ____________ Start time: ____________ Finish time: ____________ Date: ____________ Movements: ____________ Start time: ____________ Finish time: ____________ Date: ____________ Movements: ____________ Start time: ____________ Finish time: ____________ Date: ____________ Movements: ____________ Start time: ____________ Finish time: ____________  Date: ____________ Movements: ____________ Start time: ____________ Finish time: ____________ Date: ____________ Movements: ____________ Start time: ____________ Finish time: ____________ Date: ____________ Movements: ____________ Start time: ____________ Finish time: ____________ Date: ____________ Movements: ____________ Start time: ____________ Finish time: ____________ Date: ____________ Movements: ____________ Start time: ____________ Finish time: ____________ Date: ____________ Movements: ____________ Start time: ____________ Finish time: ____________ Date: ____________ Movements: ____________ Start time: ____________ Finish time: ____________  Date: ____________ Movements: ____________ Start time: ____________ Finish time: ____________ Date: ____________ Movements: ____________ Start time: ____________ Finish  time: ____________ Date: ____________ Movements: ____________ Start time: ____________ Finish time: ____________ Date: ____________ Movements: ____________ Start time: ____________ Finish time: ____________ Date: ____________ Movements: ____________ Start time: ____________ Finish time: ____________ Date: ____________ Movements: ____________ Start time: ____________ Finish time: ____________ Date: ____________ Movements: ____________ Start time: ____________ Finish time: ____________  Date: ____________ Movements: ____________ Start time: ____________ Finish   time: ____________ Date: ____________ Movements: ____________ Start time: ____________ Finish time: ____________ Date: ____________ Movements: ____________ Start time: ____________ Finish time: ____________ Date: ____________ Movements: ____________ Start time: ____________ Finish time: ____________ Date: ____________ Movements: ____________ Start time: ____________ Finish time: ____________ Date: ____________ Movements: ____________ Start time: ____________ Finish time: ____________ Date: ____________ Movements: ____________ Start time: ____________ Finish time: ____________  Date: ____________ Movements: ____________ Start time: ____________ Finish time: ____________ Date: ____________ Movements: ____________ Start time: ____________ Finish time: ____________ Date: ____________ Movements: ____________ Start time: ____________ Finish time: ____________ Date: ____________ Movements: ____________ Start time: ____________ Finish time: ____________ Date: ____________ Movements: ____________ Start time: ____________ Finish time: ____________ Date: ____________ Movements: ____________ Start time: ____________ Finish time: ____________ Date: ____________ Movements: ____________ Start time: ____________ Finish time: ____________  Date: ____________ Movements: ____________ Start time: ____________ Finish time: ____________ Date: ____________  Movements: ____________ Start time: ____________ Finish time: ____________ Date: ____________ Movements: ____________ Start time: ____________ Finish time: ____________ Date: ____________ Movements: ____________ Start time: ____________ Finish time: ____________ Date: ____________ Movements: ____________ Start time: ____________ Finish time: ____________ Date: ____________ Movements: ____________ Start time: ____________ Finish time: ____________ Date: ____________ Movements: ____________ Start time: ____________ Finish time: ____________  Date: ____________ Movements: ____________ Start time: ____________ Finish time: ____________ Date: ____________ Movements: ____________ Start time: ____________ Finish time: ____________ Date: ____________ Movements: ____________ Start time: ____________ Finish time: ____________ Date: ____________ Movements: ____________ Start time: ____________ Finish time: ____________ Date: ____________ Movements: ____________ Start time: ____________ Finish time: ____________ Date: ____________ Movements: ____________ Start time: ____________ Finish time: ____________   This information is not intended to replace advice given to you by your health care provider. Make sure you discuss any questions you have with your health care provider.   Document Released: 10/09/2006 Document Revised: 09/30/2014 Document Reviewed: 07/06/2012 Elsevier Interactive Patient Education 2016 Elsevier Inc. Braxton Hicks Contractions Contractions of the uterus can occur throughout pregnancy. Contractions are not always a sign that you are in labor.  WHAT ARE BRAXTON HICKS CONTRACTIONS?  Contractions that occur before labor are called Braxton Hicks contractions, or false labor. Toward the end of pregnancy (32-34 weeks), these contractions can develop more often and may become more forceful. This is not true labor because these contractions do not result in opening (dilatation) and thinning of  the cervix. They are sometimes difficult to tell apart from true labor because these contractions can be forceful and people have different pain tolerances. You should not feel embarrassed if you go to the hospital with false labor. Sometimes, the only way to tell if you are in true labor is for your health care provider to look for changes in the cervix. If there are no prenatal problems or other health problems associated with the pregnancy, it is completely safe to be sent home with false labor and await the onset of true labor. HOW CAN YOU TELL THE DIFFERENCE BETWEEN TRUE AND FALSE LABOR? False Labor  The contractions of false labor are usually shorter and not as hard as those of true labor.   The contractions are usually irregular.   The contractions are often felt in the front of the lower abdomen and in the groin.   The contractions may go away when you walk around or change positions while lying down.   The contractions get weaker and are shorter lasting as time goes on.   The contractions do not usually become progressively stronger, regular, and closer together as with true labor.  True Labor 5. Contractions in true   labor last 30-70 seconds, become very regular, usually become more intense, and increase in frequency.  6. The contractions do not go away with walking.  7. The discomfort is usually felt in the top of the uterus and spreads to the lower abdomen and low back.  8. True labor can be determined by your health care provider with an exam. This will show that the cervix is dilating and getting thinner.  WHAT TO REMEMBER  Keep up with your usual exercises and follow other instructions given by your health care provider.   Take medicines as directed by your health care provider.   Keep your regular prenatal appointments.   Eat and drink lightly if you think you are going into labor.   If Braxton Hicks contractions are making you uncomfortable:   Change  your position from lying down or resting to walking, or from walking to resting.   Sit and rest in a tub of warm water.   Drink 2-3 glasses of water. Dehydration may cause these contractions.   Do slow and deep breathing several times an hour.  WHEN SHOULD I SEEK IMMEDIATE MEDICAL CARE? Seek immediate medical care if:  Your contractions become stronger, more regular, and closer together.   You have fluid leaking or gushing from your vagina.   You have a fever.   You pass blood-tinged mucus.   You have vaginal bleeding.   You have continuous abdominal pain.   You have low back pain that you never had before.   You feel your baby's head pushing down and causing pelvic pressure.   Your baby is not moving as much as it used to.    This information is not intended to replace advice given to you by your health care provider. Make sure you discuss any questions you have with your health care provider.   Document Released: 09/09/2005 Document Revised: 09/14/2013 Document Reviewed: 06/21/2013 Elsevier Interactive Patient Education 2016 Elsevier Inc.  

## 2016-04-02 NOTE — MAU Provider Note (Signed)
History     CSN: [id number]  Arrival date and time: 04/02/16 1741   First Provider Initiated Contact with Patient 04/02/16 1808      Chief Complaint  Patient presents with  . Abdominal Pain   HPI  Pt is 4573w6d pregnant presents with ctx 15 minutes apart. Pt denies spotting, bleeding or LOF. Active fetus RN note:  Expand All Collapse All   Patient presents with c/o ctx every 15 mins and vaginal discharge. Patient denies any bleeding or LOF. Fetus active.         Editor: Marion DownerAshley A Garvey, RN (Registered Nurse)     Expand All Collapse All   Notified provider that patient is here for ctx 15 mins apart. Patient was 2/thick/-3 on cervical exam. Provider said to discharge patient with labor precautions.            Past Medical History  Diagnosis Date  . Knee joint pain   . Syncope   . Kidney stones   . Pregnancy induced hypertension     Past Surgical History  Procedure Laterality Date  . Kidney stone surgery      Family History  Problem Relation Age of Onset  . CVA Mother   . Hypertension Mother   . Diabetes Mellitus II Mother   . Congestive Heart Failure Mother   . CVA Father   . Hypertension Father   . Diabetes Mellitus II Father     Social History  Substance Use Topics  . Smoking status: Never Smoker   . Smokeless tobacco: None  . Alcohol Use: No    Allergies:  Allergies  Allergen Reactions  . Amoxicillin Hives    Has patient had a PCN reaction causing immediate rash, facial/tongue/throat swelling, SOB or lightheadedness with hypotension: no Has patient had a PCN reaction causing severe rash involving mucus membranes or skin necrosis: No Has patient had a PCN reaction that required hospitalization No Has patient had a PCN reaction occurring within the last 10 years: No If all of the above answers are "NO", then may proceed with Cephalosporin use.    Prescriptions prior to admission  Medication Sig Dispense Refill Last Dose  . calcium  carbonate (TUMS - DOSED IN MG ELEMENTAL CALCIUM) 500 MG chewable tablet Chew 2 tablets by mouth daily as needed for indigestion or heartburn.   Past Month at Unknown time  . cephALEXin (KEFLEX) 500 MG capsule Take 500 mg by mouth 2 (two) times daily.   04/02/2016 at Unknown time  . dimenhyDRINATE (DRAMAMINE) 50 MG tablet Take 50 mg by mouth every 8 (eight) hours as needed for dizziness.   Past Week at Unknown time  . NIFEdipine (PROCARDIA) 10 MG capsule Take 1 capsule (10 mg total) by mouth 4 (four) times daily as needed. (Patient taking differently: Take 10 mg by mouth 4 (four) times daily as needed (contractions). ) 30 capsule 1 04/01/2016 at Unknown time  . Prenatal Vit-Fe Fumarate-FA (PRENATAL MULTIVITAMIN) TABS tablet Take 1 tablet by mouth daily at 12 noon.   Past Week at Unknown time  . meclizine (ANTIVERT) 32 MG tablet Take 1 tablet (32 mg total) by mouth 3 (three) times daily as needed. (Patient not taking: Reported on 04/02/2016) 30 tablet 1     Review of Systems  Constitutional: Negative for fever and chills.  Gastrointestinal: Positive for abdominal pain. Negative for nausea, vomiting, diarrhea and constipation.  Genitourinary: Negative for dysuria.  Neurological: Positive for dizziness. Negative for headaches.   Physical Exam  Blood pressure 132/69, pulse 97, temperature 98.8 F (37.1 C), temperature source Oral, resp. rate 18, last menstrual period 07/24/2015.  Physical Exam  Nursing note and vitals reviewed. Constitutional: She is oriented to person, place, and time. She appears well-developed and well-nourished. No distress.  HENT:  Head: Normocephalic.  Eyes: Pupils are equal, round, and reactive to light.  Neck: Normal range of motion. Neck supple.  Cardiovascular: Normal rate.   Respiratory: Effort normal.  GI: Soft.  Genitourinary:  Cervix 2 cm thick -3 station  Musculoskeletal: Normal range of motion.  Neurological: She is alert and oriented to person, place, and  time.  Skin: Skin is warm and dry.  Psychiatric: She has a normal mood and affect.    MAU Course  Procedures Fetal monitoring- baseline 130 bpm, 6-25 bpm variability with accelerations 15x15 no dec; mild ctx/ UI Reactive NST Labor eval per RN  Assessment and Plan    Kristin [name] 04/02/2016, 6:11 PM

## 2016-04-02 NOTE — MAU Note (Signed)
Notified provider that patient is here for ctx 15 mins apart. Patient was 2/thick/-3 on cervical exam. Provider said to discharge patient with labor precautions.

## 2016-04-02 NOTE — MAU Note (Signed)
Patient presents with c/o ctx every 15 mins and vaginal discharge. Patient denies any bleeding or LOF. Fetus active.

## 2016-04-04 ENCOUNTER — Inpatient Hospital Stay (HOSPITAL_COMMUNITY)
Admission: AD | Admit: 2016-04-04 | Discharge: 2016-04-04 | Disposition: A | Discharge status: Home / Self Care | Payer: Managed Care, Other (non HMO) | Source: Ambulatory Visit | Attending: Obstetrics & Gynecology | Admitting: Obstetrics & Gynecology

## 2016-04-04 ENCOUNTER — Encounter (HOSPITAL_COMMUNITY): Payer: Self-pay

## 2016-04-04 DIAGNOSIS — Z3A00 Weeks of gestation of pregnancy not specified: Secondary | ICD-10-CM | POA: Diagnosis not present

## 2016-04-04 DIAGNOSIS — O4703 False labor before 37 completed weeks of gestation, third trimester: Secondary | ICD-10-CM | POA: Diagnosis present

## 2016-04-04 DIAGNOSIS — O479 False labor, unspecified: Secondary | ICD-10-CM

## 2016-04-04 DIAGNOSIS — O4700 False labor before 37 completed weeks of gestation, unspecified trimester: Secondary | ICD-10-CM

## 2016-04-04 MED ORDER — MORPHINE SULFATE (PF) 10 MG/ML IV SOLN
10.0000 mg | Freq: Once | INTRAVENOUS | Status: DC
  Filled 2016-04-04: qty 1

## 2016-04-04 MED ORDER — ONDANSETRON 8 MG PO TBDP
8.0000 mg | ORAL_TABLET | Freq: Once | ORAL | Status: DC
  Filled 2016-04-04: qty 1

## 2016-04-04 MED ORDER — NIFEDIPINE 10 MG PO CAPS
10.0000 mg | ORAL_CAPSULE | Freq: Once | ORAL | Status: AC
  Administered 2016-04-04: 10 mg via ORAL
  Filled 2016-04-04: qty 1

## 2016-04-04 NOTE — MAU Note (Signed)
Patient is here with c/o ctxs 2-3 mins apart. Patient denies any bleeding or LOF. Fetus active.

## 2016-04-04 NOTE — MAU Provider Note (Signed)
  History  Barnet GlasgowLauren G Mccartt is a 30 yo female, G3P1011 @ 36.3 wks who presents unannounced to MAU with c/o ctxs q 2-3 mins. Denies VB or LOF. Fetus active.   Patient Active Problem List   Diagnosis Date Noted  . Preterm contractions 04/04/2016  . Kidney stones--hx  03/24/2016  . History of pregnancy induced hypertension--prior pregnancy 03/24/2016  . Dizziness 03/24/2016  . Positive GBS test 03/24/2016  . LGA (large for gestational age) fetus--hx with 1st pregnancy, 9+6 03/01/2016  . Allergy to amoxicillin 03/01/2016  . 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 Syncope 08/21/2015  . Hypokalemia 08/21/2015  . Anxiety 08/21/2015    Chief Complaint  Patient presents with  . Contractions   HPI  As above  OB History    Gravida Para Term Preterm AB TAB SAB Ectopic Multiple Living   4 1 1  0 2  2   1       Past Medical History  Diagnosis Date  . Knee joint pain   . Syncope   . Kidney stones   . Pregnancy induced hypertension     Past Surgical History  Procedure Laterality Date  . Kidney stone surgery      Family History  Problem Relation Age of Onset  . CVA Mother   . Hypertension Mother   . Diabetes Mellitus II Mother   . Congestive Heart Failure Mother   . CVA Father   . Hypertension Father   . Diabetes Mellitus II Father     Social History  Substance Use Topics  . Smoking status: Never Smoker   . Smokeless tobacco: None  . Alcohol Use: No    Allergies:  Allergies  Allergen Reactions  . Amoxicillin Hives    Has patient had a PCN reaction causing immediate rash, facial/tongue/throat swelling, SOB or lightheadedness with hypotension: no Has patient had a PCN reaction causing severe rash involving mucus membranes or skin necrosis: No Has patient had a PCN reaction that required hospitalization No Has patient had a PCN reaction occurring within the last 10 years: No If all of the above answers are "NO", then may proceed with  Cephalosporin use.    No prescriptions prior to admission    ROS  As per HPI Physical Exam   Blood pressure 128/61, pulse 93, temperature 98 F (36.7 C), temperature source Oral, resp. rate 18, last menstrual period 07/24/2015.    Physical Exam  Gen: NAD. Abdomen: soft, NT, no rebound or guarding. Pelvic: 2/thick/posterior/medium/-3 per RN. FHRT: Cat 1. Toco: Mild, irregular.     ED Course  Assessment: False labor  Plan: Offered Morphine IM + Zofran and declined; elected Procardia tab instead. D/C'd home in stable condition w/ strict PTL precautions. Continue FKCs. OB f/u as scheduled.   Sherre ScarletWILLIAMS, Andraya Frigon CNM, MS 04/04/16, 1 AM

## 2016-04-04 NOTE — Discharge Instructions (Signed)
Braxton Hicks Contractions °Contractions of the uterus can occur throughout pregnancy. Contractions are not always a sign that you are in labor.  °WHAT ARE BRAXTON HICKS CONTRACTIONS?  °Contractions that occur before labor are called Braxton Hicks contractions, or false labor. Toward the end of pregnancy (32-34 weeks), these contractions can develop more often and may become more forceful. This is not true labor because these contractions do not result in opening (dilatation) and thinning of the cervix. They are sometimes difficult to tell apart from true labor because these contractions can be forceful and people have different pain tolerances. You should not feel embarrassed if you go to the hospital with false labor. Sometimes, the only way to tell if you are in true labor is for your health care provider to look for changes in the cervix. °If there are no prenatal problems or other health problems associated with the pregnancy, it is completely safe to be sent home with false labor and await the onset of true labor. °HOW CAN YOU TELL THE DIFFERENCE BETWEEN TRUE AND FALSE LABOR? °False Labor °· The contractions of false labor are usually shorter and not as hard as those of true labor.   °· The contractions are usually irregular.   °· The contractions are often felt in the front of the lower abdomen and in the groin.   °· The contractions may go away when you walk around or change positions while lying down.   °· The contractions get weaker and are shorter lasting as time goes on.   °· The contractions do not usually become progressively stronger, regular, and closer together as with true labor.   °True Labor °· Contractions in true labor last 30-70 seconds, become very regular, usually become more intense, and increase in frequency.   °· The contractions do not go away with walking.   °· The discomfort is usually felt in the top of the uterus and spreads to the lower abdomen and low back.   °· True labor can be  determined by your health care provider with an exam. This will show that the cervix is dilating and getting thinner.   °WHAT TO REMEMBER °· Keep up with your usual exercises and follow other instructions given by your health care provider.   °· Take medicines as directed by your health care provider.   °· Keep your regular prenatal appointments.   °· Eat and drink lightly if you think you are going into labor.   °· If Braxton Hicks contractions are making you uncomfortable:   °¨ Change your position from lying down or resting to walking, or from walking to resting.   °¨ Sit and rest in a tub of warm water.   °¨ Drink 2-3 glasses of water. Dehydration may cause these contractions.   °¨ Do slow and deep breathing several times an hour.   °WHEN SHOULD I SEEK IMMEDIATE MEDICAL CARE? °Seek immediate medical care if: °· Your contractions become stronger, more regular, and closer together.   °· You have fluid leaking or gushing from your vagina.   °· You have a fever.   °· You pass blood-tinged mucus.   °· You have vaginal bleeding.   °· You have continuous abdominal pain.   °· You have low back pain that you never had before.   °· You feel your baby's head pushing down and causing pelvic pressure.   °· Your baby is not moving as much as it used to.   °  °This information is not intended to replace advice given to you by your health care provider. Make sure you discuss any questions you have with your health care   provider. °  °Document Released: 09/09/2005 Document Revised: 09/14/2013 Document Reviewed: 06/21/2013 °Elsevier Interactive Patient Education ©2016 [employer] ° °

## 2016-04-14 ENCOUNTER — Inpatient Hospital Stay (HOSPITAL_COMMUNITY)
Admission: AD | Admit: 2016-04-14 | Discharge: 2016-04-16 | DRG: 775 | Disposition: A | Discharge status: Home / Self Care | Payer: Managed Care, Other (non HMO) | Source: Ambulatory Visit | Attending: Obstetrics & Gynecology | Admitting: Obstetrics & Gynecology

## 2016-04-14 ENCOUNTER — Encounter (HOSPITAL_COMMUNITY): Payer: Self-pay | Admitting: Anesthesiology

## 2016-04-14 ENCOUNTER — Encounter (HOSPITAL_COMMUNITY): Payer: Self-pay | Admitting: *Deleted

## 2016-04-14 DIAGNOSIS — Z8249 Family history of ischemic heart disease and other diseases of the circulatory system: Secondary | ICD-10-CM | POA: Diagnosis not present

## 2016-04-14 DIAGNOSIS — Z87442 Personal history of urinary calculi: Secondary | ICD-10-CM

## 2016-04-14 DIAGNOSIS — Z6841 Body Mass Index (BMI) 40.0 and over, adult: Secondary | ICD-10-CM

## 2016-04-14 DIAGNOSIS — Z3A37 37 weeks gestation of pregnancy: Secondary | ICD-10-CM

## 2016-04-14 DIAGNOSIS — Z833 Family history of diabetes mellitus: Secondary | ICD-10-CM

## 2016-04-14 DIAGNOSIS — O3663X0 Maternal care for excessive fetal growth, third trimester, not applicable or unspecified: Secondary | ICD-10-CM | POA: Diagnosis present

## 2016-04-14 DIAGNOSIS — Z823 Family history of stroke: Secondary | ICD-10-CM

## 2016-04-14 DIAGNOSIS — O99214 Obesity complicating childbirth: Secondary | ICD-10-CM | POA: Diagnosis present

## 2016-04-14 DIAGNOSIS — O99824 Streptococcus B carrier state complicating childbirth: Secondary | ICD-10-CM | POA: Diagnosis present

## 2016-04-14 DIAGNOSIS — O368390 Maternal care for abnormalities of the fetal heart rate or rhythm, unspecified trimester, not applicable or unspecified: Secondary | ICD-10-CM | POA: Insufficient documentation

## 2016-04-14 LAB — OB RESULTS CONSOLE HIV ANTIBODY (ROUTINE TESTING): HIV: NONREACTIVE

## 2016-04-14 LAB — CBC
HCT: 36.1 % (ref 36.0–46.0)
Hemoglobin: 12 g/dL (ref 12.0–15.0)
MCH: 27.6 pg (ref 26.0–34.0)
MCHC: 33.2 g/dL (ref 30.0–36.0)
MCV: 83 fL (ref 78.0–100.0)
PLATELETS: 246 10*3/uL (ref 150–400)
RBC: 4.35 MIL/uL (ref 3.87–5.11)
RDW: 14.5 % (ref 11.5–15.5)
WBC: 14.1 10*3/uL — AB (ref 4.0–10.5)

## 2016-04-14 LAB — ABO/RH: ABO/RH(D): O POS

## 2016-04-14 LAB — OB RESULTS CONSOLE RPR: RPR: NONREACTIVE

## 2016-04-14 LAB — TYPE AND SCREEN
ABO/RH(D): O POS
ANTIBODY SCREEN: NEGATIVE

## 2016-04-14 LAB — OB RESULTS CONSOLE RUBELLA ANTIBODY, IGM: Rubella: IMMUNE

## 2016-04-14 LAB — OB RESULTS CONSOLE HEPATITIS B SURFACE ANTIGEN: HEP B S AG: NEGATIVE

## 2016-04-14 LAB — OB RESULTS CONSOLE GC/CHLAMYDIA
Chlamydia: NEGATIVE
Gonorrhea: NEGATIVE

## 2016-04-14 LAB — OB RESULTS CONSOLE ANTIBODY SCREEN: ANTIBODY SCREEN: NEGATIVE

## 2016-04-14 LAB — OB RESULTS CONSOLE GBS: STREP GROUP B AG: POSITIVE

## 2016-04-14 LAB — OB RESULTS CONSOLE ABO/RH: RH TYPE: POSITIVE

## 2016-04-14 MED ORDER — PHENYLEPHRINE 40 MCG/ML (10ML) SYRINGE FOR IV PUSH (FOR BLOOD PRESSURE SUPPORT)
80.0000 ug | PREFILLED_SYRINGE | INTRAVENOUS | Status: DC | PRN
  Filled 2016-04-14: qty 5

## 2016-04-14 MED ORDER — OXYCODONE-ACETAMINOPHEN 5-325 MG PO TABS: 1.0000 | ORAL_TABLET | ORAL | Status: DC | PRN

## 2016-04-14 MED ORDER — DIPHENHYDRAMINE HCL 50 MG/ML IJ SOLN: 12.5000 mg | INTRAMUSCULAR | Status: DC | PRN

## 2016-04-14 MED ORDER — ACETAMINOPHEN 325 MG PO TABS: 650.0000 mg | ORAL_TABLET | ORAL | Status: DC | PRN

## 2016-04-14 MED ORDER — LIDOCAINE HCL (PF) 1 % IJ SOLN
30.0000 mL | INTRAMUSCULAR | Status: DC | PRN
  Filled 2016-04-14: qty 30

## 2016-04-14 MED ORDER — EPHEDRINE 5 MG/ML INJ
10.0000 mg | INTRAVENOUS | Status: DC | PRN
  Filled 2016-04-14: qty 4

## 2016-04-14 MED ORDER — ONDANSETRON HCL 4 MG/2ML IJ SOLN
4.0000 mg | Freq: Four times a day (QID) | INTRAMUSCULAR | Status: DC | PRN
  Administered 2016-04-14: 4 mg via INTRAVENOUS
  Filled 2016-04-14: qty 2

## 2016-04-14 MED ORDER — OXYTOCIN 40 UNITS IN LACTATED RINGERS INFUSION - SIMPLE MED: 2.5000 [IU]/h | INTRAVENOUS | Status: DC

## 2016-04-14 MED ORDER — OXYCODONE-ACETAMINOPHEN 5-325 MG PO TABS: 2.0000 | ORAL_TABLET | ORAL | Status: DC | PRN

## 2016-04-14 MED ORDER — LACTATED RINGERS IV SOLN: 500.0000 mL | INTRAVENOUS | Status: DC | PRN

## 2016-04-14 MED ORDER — FENTANYL 2.5 MCG/ML BUPIVACAINE 1/10 % EPIDURAL INFUSION (WH - ANES)
14.0000 mL/h | INTRAMUSCULAR | Status: DC | PRN
  Administered 2016-04-15: 14 mL/h via EPIDURAL
  Filled 2016-04-14: qty 125

## 2016-04-14 MED ORDER — OXYTOCIN 40 UNITS IN LACTATED RINGERS INFUSION - SIMPLE MED
1.0000 m[IU]/min | INTRAVENOUS | Status: DC
  Administered 2016-04-14: 2 m[IU]/min via INTRAVENOUS
  Filled 2016-04-14: qty 1000

## 2016-04-14 MED ORDER — VANCOMYCIN HCL IN DEXTROSE 1-5 GM/200ML-% IV SOLN
1000.0000 mg | Freq: Two times a day (BID) | INTRAVENOUS | Status: DC
  Administered 2016-04-14 (×2): 1000 mg via INTRAVENOUS
  Filled 2016-04-14 (×3): qty 200

## 2016-04-14 MED ORDER — NALBUPHINE HCL 10 MG/ML IJ SOLN
10.0000 mg | INTRAMUSCULAR | Status: DC | PRN
  Administered 2016-04-14: 10 mg via INTRAVENOUS
  Filled 2016-04-14: qty 1

## 2016-04-14 MED ORDER — PHENYLEPHRINE 40 MCG/ML (10ML) SYRINGE FOR IV PUSH (FOR BLOOD PRESSURE SUPPORT)
80.0000 ug | PREFILLED_SYRINGE | INTRAVENOUS | Status: DC | PRN
  Filled 2016-04-14: qty 10
  Filled 2016-04-14: qty 5

## 2016-04-14 MED ORDER — TERBUTALINE SULFATE 1 MG/ML IJ SOLN
0.2500 mg | Freq: Once | INTRAMUSCULAR | Status: DC | PRN
  Filled 2016-04-14: qty 1

## 2016-04-14 MED ORDER — SOD CITRATE-CITRIC ACID 500-334 MG/5ML PO SOLN: 30.0000 mL | ORAL | Status: DC | PRN

## 2016-04-14 MED ORDER — LACTATED RINGERS IV SOLN: 500.0000 mL | Freq: Once | INTRAVENOUS | Status: DC

## 2016-04-14 MED ORDER — OXYTOCIN BOLUS FROM INFUSION: 500.0000 mL | Freq: Once | INTRAVENOUS | Status: DC

## 2016-04-14 MED ORDER — FLEET ENEMA 7-19 GM/118ML RE ENEM: 1.0000 | ENEMA | RECTAL | Status: DC | PRN

## 2016-04-14 MED ORDER — LACTATED RINGERS IV SOLN
INTRAVENOUS | Status: DC
  Administered 2016-04-14: 10:00:00 via INTRAVENOUS
  Administered 2016-04-14: 125 mL/h via INTRAVENOUS

## 2016-04-14 NOTE — H&P (Signed)
Kristin [name] is a 30 y.o. femaleG4P2012 at 37 wks and 4 days based on LMP 07/23/2016 with EDD 05/01/2016 presents for complaining of regular contraction that are painful every 2-4 minutes +FM no lof no vaginal bleeding. This Pregnancy has been uncomplicated. PNC provided by [facility]. .Patient reports h/o Preeclampsia with previous pregnancy. She was induced with her last pregnancy due to preeclampsia.     OB History    Gravida Para Term Preterm AB Living   4 1 1  0 2 1   SAB TAB Ectopic Multiple Live Births   2             U/S 04/04/2016 EFW 8 lbs 2 oz posterior placenta normal AFI  Past Medical History:  Diagnosis Date  . Kidney stones    Kidney Stones  . Knee joint pain   . Pregnancy induced hypertension   . Syncope    Past Surgical History:  Procedure Laterality Date  . KIDNEY STONE SURGERY     Family History: family history includes CVA in her father and mother; Congestive Heart Failure in her mother; Diabetes Mellitus II in her father and mother; Hypertension in her father and mother. Social History:  reports that she has never smoked. She has never used smokeless tobacco. She reports that she does not drink alcohol or use drugs.     Maternal Diabetes: No Genetic Screening: Results unavailable at the time of the H&P Maternal Ultrasounds/Referrals: Normal Fetal Ultrasounds or other Referrals:  None Maternal Substance Abuse:  No Significant Maternal Medications:  None Significant Maternal Lab Results:  Lab values include: Group B Strep positive Other Comments:  None  Review of Systems  Constitutional: Negative.   HENT: Negative.   Eyes: Negative.   Respiratory: Negative.   Cardiovascular: Negative.   Gastrointestinal: Negative.   Genitourinary: Negative.   Musculoskeletal: Negative.   Skin: Negative.   Neurological: Negative.   Endo/Heme/Allergies: Negative.   Psychiatric/Behavioral: Negative.    Maternal Medical History:  Reason for admission: Contractions.    Contractions: Onset was 3-5 hours ago.   Frequency: regular.   Perceived severity is moderate.    Fetal activity: Perceived fetal activity is normal.   Last perceived fetal movement was within the past hour.    Prenatal complications: no prenatal complications Prenatal Complications - Diabetes: none.    Dilation: 2 Effacement (%): Thick Station: -3 Exam by:: Dr Richardson Dopp Blood pressure 136/62, pulse 82, temperature 97.9 F (36.6 C), temperature source Oral, resp. rate 18, height 5\' 5"  (1.651 m), weight 251 lb (113.9 kg), last menstrual period 07/24/2015. Maternal Exam:  Uterine Assessment: Contraction strength is moderate.  Contraction duration is 1 minute. Contraction frequency is regular.   Abdomen: Patient reports no abdominal tenderness. Fetal presentation: vertex  Introitus: Normal vulva. Normal vagina.  Pelvis: adequate for delivery.      Fetal Exam Fetal Monitor Review: Baseline rate: 120.  Variability: moderate (6-25 bpm).   Pattern: accelerations present and variable decelerations.    Fetal State Assessment: Category II - tracings are indeterminate.     Physical Exam  Vitals reviewed. Constitutional: She is oriented to person, place, and time. She appears well-developed and well-nourished.  HENT:  Head: Normocephalic and atraumatic.  Eyes: Pupils are equal, round, and reactive to light.  Neck: Neck supple.  Cardiovascular: Normal rate and regular rhythm.   Respiratory: Effort normal and breath sounds normal.  GI: Bowel sounds are normal. There is no tenderness.  Genitourinary: Vagina normal.  Musculoskeletal: Normal range of motion.  She exhibits no edema.  Neurological: She is alert and oriented to person, place, and time. She has normal reflexes.  Skin: Skin is warm and dry.  Psychiatric: She has a normal mood and affect.    Prenatal labs: ABO, Rh: O/Positive/-- (07/23 0000) Antibody: Negative (07/23 0000) Rubella: Immune (07/23 0000) RPR: Nonreactive  (07/23 0000)  HBsAg: Negative (07/23 0000)  HIV: Non-reactive (07/23 0000)  GBS: Positive (07/23 0000)   Assessment/Plan: 37 wks and 4 days in latent labor with painful regular contractions and variable decelerations  Admit to labor and delivery. Recheck cervix in 2 hours if no cervical change augment with pitocin. I discussed with pt increased r/o Cesarean section associated with induction she voiced understanding and accepts this risk.  Anticipate SVD GBS positive - Vancomycin started as pt is allergic to amoxicillin and sensitivities to clindamycin were not done.    [name] J. 04/14/2016, 10:25 AM

## 2016-04-14 NOTE — Progress Notes (Addendum)
  Subjective: No pain from ctxs. Anxious about process.  Objective: BP 132/61   Pulse 75   Temp 98.3 F (36.8 C) (Oral)   Resp 16   Ht 5\' 5"  (1.651 m)   Wt 113.9 kg (251 lb)   LMP 07/24/2015 (Approximate)   BMI 41.77 kg/m  No intake/output data recorded. No intake/output data recorded.  Today's Vitals   04/14/16 1602 04/14/16 1650 04/14/16 1805 04/14/16 1929  BP: 129/66 120/74 132/61   Pulse: 75 72 75   Resp: 20 18 20 16   Temp: 98.1 F (36.7 C)   98.3 F (36.8 C)  TempSrc: Oral   Oral  Weight:      Height:      PainSc:    0-No pain    FHT: BL 120 w/ min variability, occ accel, no decels UC:   irregular, every 2-5 minutes SVE: 2/40/soft/BLT @ 22:28 PM - much easier exam  Pitocin at 12 mU/min  Intracervical balloon placed at 22:30 w/o event -- balloon filled w/ 60 ml fluid slowly; traction applied.  2nd dose of Vancomycin given at 22:10 PM  Assessment:  Overall reassuring FHRT - premedicated w/ Nubain & Zofran prior to intracervical balloon placement GBS positive  Plan: Continue induction Expect progress and SVD Consult prn  Sherre Scarlet CNM 04/14/2016, 10:50 PM

## 2016-04-14 NOTE — Progress Notes (Addendum)
Assuming care of Francille G. Snyder, 30 yo G3P1011 @ 37.6 wks by sure LMP of 07/24/15 c/w u/s at 13.2 wks (EDD 04/29/16) presented to MAU due to ctxs and admitted for Cat 2 FHRT. FOB and pt's mother-in-law at bedside.  Subjective: +FM. Denies h/a, visual changes, epigastric pain, difficulty breathing, VB, LOF or painful ctxs.    Objective: BP 132/61   Pulse 75   Temp 98.3 F (36.8 C) (Oral)   Resp 16   Ht 5\' 5"  (1.651 m)   Wt 113.9 kg (251 lb)   LMP 07/24/2015 (Approximate)   BMI 41.77 kg/m  No intake/output data recorded. No intake/output data recorded.  Today's Vitals   04/14/16 1602 04/14/16 1650 04/14/16 1805 04/14/16 1929  BP: 129/66 120/74 132/61   Pulse: 75 72 75   Resp: 20 18 20 16   Temp: 98.1 F (36.7 C)   98.3 F (36.8 C)  TempSrc: Oral   Oral  Weight:      Height:      PainSc:    0-No pain   Gen: NAD Lungs: CTAB CV: RRR w/o M/R/G Abdomen: gravid, soft btw ctxs, NT Cephalic by Leopold's Pelvis proven to 9lbs 6 oz (Delivery occurred 10 years ago; induced due to preE per pt's report) EFW 8lbs 2 oz (3673 g) at 36.3 wks on 04/04/16 -- 97th%tile. FHT: BL 135 w/ moderate variability, +accels/scalp stim, mild, non-repetitive variables, and occ lates - variables & lates resolved w/ position change and IVFs. UC:   irregular, every 3-5 minutes, palpate mild SVE: Unable to reach, ? 1-2 cm, soft - pt unable to tolerate exam Pitocin at 12 mU/min  1st dose of Vancomycin given today at 09:51 AM  Assessment:  IUP at 37.6 wks IOL due to Cat 2 tracing Overall reassuring tracing at present GBS positive Elevated BMI (41) Possible LGA infant  Plan: 1) Reviewed options of induction to include Foley bulb, AROM and Pitocin. Pitocin started at 13:03 today.  Risks and benefits of induction were reviewed, including failure of method, prolonged labor, need for further intervention, risk of cesarean. Patient and family seem to understand these risks and wish to proceed. 2) Allow  ambulation w/ wireless monitoring as appropriate to promote descent. 3) Continue Intrauterine resuscitative measures prn.   Sherre Scarlet CNM 04/14/2016, 9:10 PM

## 2016-04-14 NOTE — Anesthesia Pain Management Evaluation Note (Signed)
  CRNA Pain Management Visit Note  Patient: Kristin [name], 30 y.o., female  "Hello I am a member of the anesthesia team at [facility]. We have an anesthesia team available at all times to provide care throughout the hospital, including epidural management and anesthesia for C-section. I don't know your plan for the delivery whether it a natural birth, water birth, IV sedation, nitrous supplementation, doula or epidural, but we want to meet your pain goals."   1.Was your pain managed to your expectations on prior hospitalizations?   Yes   2.What is your expectation for pain management during this hospitalization?     Labor support without medications  3.How can we help you reach that goal? unsure  Record the patient's initial score and the patient's pain goal.   Pain: 6  Pain Goal: 8 [facility] wants you to be able to say your pain was always managed very well.  Cephus Shelling 04/14/2016

## 2016-04-14 NOTE — Progress Notes (Signed)
FHR strip reviewed by Dr Richardson Dopp

## 2016-04-14 NOTE — MAU Note (Signed)
Pt presents complaining of worsening contractions. States she has been contracting all weekend and they got worse this am. Denies leaking or bleeding. Reports good fetal movement. 3cm in office Thursday.

## 2016-04-15 ENCOUNTER — Encounter (HOSPITAL_COMMUNITY): Payer: Self-pay | Admitting: *Deleted

## 2016-04-15 ENCOUNTER — Inpatient Hospital Stay (HOSPITAL_COMMUNITY): Payer: Managed Care, Other (non HMO) | Admitting: Anesthesiology

## 2016-04-15 LAB — RPR: RPR Ser Ql: NONREACTIVE

## 2016-04-15 MED ORDER — METHYLERGONOVINE MALEATE 0.2 MG PO TABS: 0.2000 mg | ORAL_TABLET | ORAL | Status: DC | PRN

## 2016-04-15 MED ORDER — DIBUCAINE 1 % RE OINT: 1.0000 "application " | TOPICAL_OINTMENT | RECTAL | Status: DC | PRN

## 2016-04-15 MED ORDER — BENZOCAINE-MENTHOL 20-0.5 % EX AERO
1.0000 "application " | INHALATION_SPRAY | CUTANEOUS | Status: DC | PRN
  Administered 2016-04-15: 1 via TOPICAL
  Filled 2016-04-15: qty 56

## 2016-04-15 MED ORDER — MEDROXYPROGESTERONE ACETATE 150 MG/ML IM SUSP: 150.0000 mg | INTRAMUSCULAR | Status: DC | PRN

## 2016-04-15 MED ORDER — LACTATED RINGERS IV SOLN
INTRAVENOUS | Status: DC
  Administered 2016-04-15: 05:00:00 via INTRAUTERINE

## 2016-04-15 MED ORDER — IBUPROFEN 600 MG PO TABS
600.0000 mg | ORAL_TABLET | Freq: Four times a day (QID) | ORAL | Status: DC
  Administered 2016-04-15 – 2016-04-16 (×5): 600 mg via ORAL
  Filled 2016-04-15 (×5): qty 1

## 2016-04-15 MED ORDER — ONDANSETRON HCL 4 MG/2ML IJ SOLN: 4.0000 mg | INTRAMUSCULAR | Status: DC | PRN

## 2016-04-15 MED ORDER — PRENATAL MULTIVITAMIN CH
1.0000 | ORAL_TABLET | Freq: Every day | ORAL | Status: DC
  Administered 2016-04-15 – 2016-04-16 (×2): 1 via ORAL
  Filled 2016-04-15 (×2): qty 1

## 2016-04-15 MED ORDER — OXYCODONE-ACETAMINOPHEN 5-325 MG PO TABS: 1.0000 | ORAL_TABLET | ORAL | Status: DC | PRN

## 2016-04-15 MED ORDER — WITCH HAZEL-GLYCERIN EX PADS: 1.0000 "application " | MEDICATED_PAD | CUTANEOUS | Status: DC | PRN

## 2016-04-15 MED ORDER — PHENYLEPHRINE 40 MCG/ML (10ML) SYRINGE FOR IV PUSH (FOR BLOOD PRESSURE SUPPORT): 80.0000 ug | PREFILLED_SYRINGE | INTRAVENOUS | Status: DC | PRN

## 2016-04-15 MED ORDER — ONDANSETRON HCL 4 MG PO TABS: 4.0000 mg | ORAL_TABLET | ORAL | Status: DC | PRN

## 2016-04-15 MED ORDER — FERROUS SULFATE 325 (65 FE) MG PO TABS
325.0000 mg | ORAL_TABLET | Freq: Two times a day (BID) | ORAL | Status: DC
  Administered 2016-04-15 – 2016-04-16 (×2): 325 mg via ORAL
  Filled 2016-04-15 (×2): qty 1

## 2016-04-15 MED ORDER — FENTANYL 2.5 MCG/ML BUPIVACAINE 1/10 % EPIDURAL INFUSION (WH - ANES): 14.0000 mL/h | INTRAMUSCULAR | Status: DC | PRN

## 2016-04-15 MED ORDER — ZOLPIDEM TARTRATE 5 MG PO TABS: 5.0000 mg | ORAL_TABLET | Freq: Every evening | ORAL | Status: DC | PRN

## 2016-04-15 MED ORDER — LACTATED RINGERS IV SOLN
500.0000 mL | Freq: Once | INTRAVENOUS | Status: DC
Start: 2016-04-15 — End: 2016-04-15

## 2016-04-15 MED ORDER — PHENYLEPHRINE 40 MCG/ML (10ML) SYRINGE FOR IV PUSH (FOR BLOOD PRESSURE SUPPORT)
80.0000 ug | PREFILLED_SYRINGE | INTRAVENOUS | Status: DC | PRN
Start: 2016-04-15 — End: 2016-04-15

## 2016-04-15 MED ORDER — TETANUS-DIPHTH-ACELL PERTUSSIS 5-2.5-18.5 LF-MCG/0.5 IM SUSP
0.5000 mL | Freq: Once | INTRAMUSCULAR | Status: AC
  Administered 2016-04-16: 0.5 mL via INTRAMUSCULAR
  Filled 2016-04-15: qty 0.5

## 2016-04-15 MED ORDER — METHYLERGONOVINE MALEATE 0.2 MG/ML IJ SOLN: 0.2000 mg | INTRAMUSCULAR | Status: DC | PRN

## 2016-04-15 MED ORDER — SENNOSIDES-DOCUSATE SODIUM 8.6-50 MG PO TABS
2.0000 | ORAL_TABLET | ORAL | Status: DC
Start: 2016-04-16 — End: 2016-04-16
  Administered 2016-04-15: 2 via ORAL
  Filled 2016-04-15: qty 2

## 2016-04-15 MED ORDER — EPHEDRINE 5 MG/ML INJ
10.0000 mg | INTRAVENOUS | Status: DC | PRN
Start: 2016-04-15 — End: 2016-04-15

## 2016-04-15 MED ORDER — EPHEDRINE 5 MG/ML INJ: 10.0000 mg | INTRAVENOUS | Status: DC | PRN

## 2016-04-15 MED ORDER — OXYCODONE-ACETAMINOPHEN 5-325 MG PO TABS: 2.0000 | ORAL_TABLET | ORAL | Status: DC | PRN

## 2016-04-15 MED ORDER — DIPHENHYDRAMINE HCL 25 MG PO CAPS: 25.0000 mg | ORAL_CAPSULE | Freq: Four times a day (QID) | ORAL | Status: DC | PRN

## 2016-04-15 MED ORDER — ACETAMINOPHEN 325 MG PO TABS
650.0000 mg | ORAL_TABLET | ORAL | Status: DC | PRN
  Administered 2016-04-15 (×2): 650 mg via ORAL
  Filled 2016-04-15 (×2): qty 2

## 2016-04-15 MED ORDER — LIDOCAINE HCL (PF) 1 % IJ SOLN
INTRAMUSCULAR | Status: DC | PRN
  Administered 2016-04-15: 6 mL via EPIDURAL
  Administered 2016-04-15: 4 mL

## 2016-04-15 MED ORDER — SIMETHICONE 80 MG PO CHEW: 80.0000 mg | CHEWABLE_TABLET | ORAL | Status: DC | PRN

## 2016-04-15 MED ORDER — DIPHENHYDRAMINE HCL 50 MG/ML IJ SOLN: 12.5000 mg | INTRAMUSCULAR | Status: DC | PRN

## 2016-04-15 MED ORDER — COCONUT OIL OIL
1.0000 "application " | TOPICAL_OIL | Status: DC | PRN
  Administered 2016-04-16: 1 via TOPICAL
  Filled 2016-04-15: qty 120

## 2016-04-15 NOTE — Progress Notes (Signed)
  Subjective: Up to BR, FB out at 00:20 AM. 8/10 ctx pain. Desires epidural. +FM. Denies LOF. +bloody show.  Objective: BP 132/61   Pulse 75   Temp 98.2 F (36.8 C) (Oral)   Resp 20   Ht 5\' 5"  (1.651 m)   Wt 113.9 kg (251 lb)   LMP 07/24/2015 (Approximate)   BMI 41.77 kg/m  No intake/output data recorded. No intake/output data recorded.  Today's Vitals   04/14/16 2231 04/14/16 2300 04/15/16 0031 04/15/16 0111  BP:      Pulse:      Resp:   20   Temp:  98.7 F (37.1 C) 98.2 F (36.8 C)   TempSrc:  Oral Oral   Weight:      Height:      PainSc: 0-No pain   8     FHT: BL 120 w/ moderate variability, +accels, no decels UC:  irregular, every 2-3 minutes - MVUs appear adequate SVE:  4/thick/-2 at 0050; head well applied to cvx Pitocin at 14 mU/min  AROM'd after exam - copious amount of blood tinged fluid noted  FSE placed at 00:57 IUPC placed at 00:59  Assessment:  IUP at 38.0 wks Tracing noted to be Cat 1 after FSE applied Latent labor GBS positive  Plan: Epidural now Anticipate progress and SVD  Sherre Scarlet CNM 04/15/2016, 1:17 AM

## 2016-04-15 NOTE — Anesthesia Preprocedure Evaluation (Addendum)
Anesthesia Evaluation  Patient identified by MRN, date of birth, ID band Patient awake    Reviewed: Allergy & Precautions, H&P , Patient's Chart, lab work & pertinent test results  Airway Mallampati: II  TM Distance: >3 FB Neck ROM: full    Dental  (+) Teeth Intact   Pulmonary    breath sounds clear to auscultation       Cardiovascular  Rhythm:regular Rate:Normal     Neuro/Psych    GI/Hepatic   Endo/Other  Morbid obesity  Renal/GU Renal disease     Musculoskeletal   Abdominal   Peds  Hematology   Anesthesia Other Findings       Reproductive/Obstetrics (+) Pregnancy                            Anesthesia Physical Anesthesia Plan  ASA: II  Anesthesia Plan: Epidural   Post-op Pain Management:    Induction:   Airway Management Planned:   Additional Equipment:   Intra-op Plan:   Post-operative Plan:   Informed Consent: I have reviewed the patients History and Physical, chart, labs and discussed the procedure including the risks, benefits and alternatives for the proposed anesthesia with the patient or authorized representative who has indicated his/her understanding and acceptance.   Dental Advisory Given  Plan Discussed with: Anesthesiologist  Anesthesia Plan Comments: (Labs checked- platelets confirmed with RN in room. Fetal heart tracing, per RN, reported to be stable enough for sitting procedure. Discussed epidural, and patient consents to the procedure:  included risk of possible headache,backache, failed block, allergic reaction, and nerve injury. This patient was asked if she had any questions or concerns before the procedure started.)       Anesthesia Quick Evaluation

## 2016-04-15 NOTE — Anesthesia Postprocedure Evaluation (Signed)
Anesthesia Post Note  Patient: Kristin [name]  Procedure(s) Performed: * No procedures listed *  Patient location during evaluation: Mother Baby Anesthesia Type: Epidural Level of consciousness: awake, awake and alert, oriented and patient cooperative Pain management: pain level controlled Vital Signs Assessment: post-procedure vital signs reviewed and stable Respiratory status: spontaneous breathing, nonlabored ventilation and respiratory function stable Cardiovascular status: stable Postop Assessment: no headache, patient able to bend at knees, no backache and no signs of nausea or vomiting Anesthetic complications: no     Last Vitals:  Vitals:   04/15/16 1004 04/15/16 1019  BP: 129/62 (!) 129/55  Pulse: 73 67  Resp: 16 17  Temp:  36.4 C    Last Pain:  Vitals:   04/15/16 1145  TempSrc:   PainSc: 0-No pain   Pain Goal: Patients Stated Pain Goal: 3 (04/15/16 0111)               Billie Intriago L

## 2016-04-15 NOTE — Progress Notes (Signed)
  Subjective: Sleeping but easily aroused.  Objective: BP 132/61   Pulse 75   Temp 98.1 F (36.7 C)   Resp 20   Ht 5\' 5"  (1.651 m)   Wt 113.9 kg (251 lb)   LMP 07/24/2015 (Approximate)   BMI 41.77 kg/m  No intake/output data recorded. No intake/output data recorded.  Today's Vitals   04/15/16 0111 04/15/16 0151 04/15/16 0216 04/15/16 0350  BP:      Pulse:      Resp:      Temp:   98.2 F (36.8 C) 98.1 F (36.7 C)  TempSrc:   Oral   Weight:      Height:      PainSc: 8  0-No pain      FHT: BL 125 w/ moderate variability, +accels, occ variable and late - resolved w/ position change and IVFs UC:   irregular, every 2-3 minutes, inadequate MVUs SVE:   Dilation: 4 Effacement (%): 50 Station: -2 Exam by:: kim Harlean Regula,cnm@ 03:50 Pitocin at 22 mU/min  Assessment:  IUP at 38.0 wks IOL secondary to Cat 2 tracing Latent labor GBS positive  Plan: On peanut Begin amnioinfusion due to intermittent variables Titrate Pitocin to adequate labor Frequent position changes Intrauterine resuscitative measures as needed  Sherre Scarlet CNM 04/15/2016, 4:08 AM

## 2016-04-15 NOTE — Lactation Note (Signed)
This note was copied from a baby's chart. Lactation Consultation Note Initial visit at 10 hours of age.  Mom reports a few good feedings and denies concerns.  Mom has visitors in the room and when asked about hand expression she prefers to have her nurse show her later, but reports baby is getting colostrum easily.  [facility] LC resources given and discussed.  Encouraged to feed with early cues on demand.  Early newborn behavior discussed.  Mom to call for assist as needed.    Patient Name: Kristin [name] Today's Date: 04/15/2016 Reason for consult: Initial assessment   Maternal Data Has patient been taught Hand Expression?: Yes Does the patient have breastfeeding experience prior to this delivery?: Yes  Feeding Feeding Type: Breast Fed  LATCH Score/Interventions Latch: Grasps breast easily, tongue down, lips flanged, rhythmical sucking.  Audible Swallowing: A few with stimulation Intervention(s): Skin to skin;Alternate breast massage  Type of Nipple: Everted at rest and after stimulation  Comfort (Breast/Nipple): Soft / non-tender     Hold (Positioning): Assistance needed to correctly position infant at breast and maintain latch. Intervention(s): Breastfeeding basics reviewed  LATCH Score: 8  Lactation Tools Discussed/Used     Consult Status Consult Status: Follow-up Date: 04/16/16 Follow-up type: In-patient    Shoptaw, Arvella Merles 04/15/2016, 5:57 PM

## 2016-04-15 NOTE — Anesthesia Procedure Notes (Signed)

## 2016-04-16 LAB — CBC
HEMATOCRIT: 31.2 % — AB (ref 36.0–46.0)
HEMOGLOBIN: 10.2 g/dL — AB (ref 12.0–15.0)
MCH: 27.4 pg (ref 26.0–34.0)
MCHC: 32.7 g/dL (ref 30.0–36.0)
MCV: 83.9 fL (ref 78.0–100.0)
Platelets: 179 10*3/uL (ref 150–400)
RBC: 3.72 MIL/uL — AB (ref 3.87–5.11)
RDW: 14.8 % (ref 11.5–15.5)
WBC: 12.4 10*3/uL — AB (ref 4.0–10.5)

## 2016-04-16 MED ORDER — IBUPROFEN 600 MG PO TABS: 600.0000 mg | ORAL_TABLET | Freq: Four times a day (QID) | ORAL | 0 refills | Status: DC

## 2016-04-16 NOTE — Lactation Note (Signed)
This note was copied from a baby's chart. Lactation Consultation Note Follow up consult with this mom of a term baby, now 84 hours old. Mom haas not latched the baby since yesterday, due to cracked nipple base on the left. I showed mom how to hand express, and applied EBM to site, I then had mom latch baby in football hold to her right breast. Baby latched deeply, with good breast movement. Mom denied pain. I advised mom to breast feed and if baby will not latch, or left nipple too sore, to pump both breast about every 3 hours, for at least 15 minutes. I gave mom comfort gels and instructed her in their use and care. Mom knows to call lactation as needed, and to come to lactation support groups also, if she desires.   Patient Name: Kristin [name] [id number] Date: 04/16/2016 Reason for consult: Follow-up assessment   Maternal Data    Feeding Feeding Type: Breast Fed  LATCH Score/Interventions Latch: Grasps breast easily, tongue down, lips flanged, rhythmical sucking.  Audible Swallowing: None  Type of Nipple: Everted at rest and after stimulation  Comfort (Breast/Nipple): Filling, red/small blisters or bruises, mild/mod discomfort  Problem noted: Cracked, bleeding, blisters, bruises;Severe discomfort Interventions  (Cracked/bleeding/bruising/blister): Expressed breast milk to nipple (coconut oil)  Hold (Positioning): Assistance needed to correctly position infant at breast and maintain latch. Intervention(s): Breastfeeding basics reviewed;Support Pillows;Position options  LATCH Score: 6  Lactation Tools Discussed/Used Pump Review: Milk Storage   Consult Status Consult Status: Complete Follow-up type: Call as needed    Alfred Levins 04/16/2016, 12:21 PM

## 2016-04-16 NOTE — Discharge Instructions (Signed)

## 2016-04-16 NOTE — Progress Notes (Signed)
MOB was referred for history of anxiety.  LCSW completed chart review and reviewed dx of "situational anxiety" in 2016 which was treated with PRN medication. MOB had no evidence of anxiety related to prenatal care.  No concerns at this time as MOB was treated in November in ED.    Referral is screened out by Clinical Social Worker because none of the following criteria appear to apply: -History of anxiety/depression during this pregnancy, or of post-partum depression. - Diagnosis of anxiety and/or depression within last 3 years - History of depression due to pregnancy loss/loss of child or -MOB's symptoms are currently being treated with medication and/or therapy.  Please contact the Clinical Social Worker if needs arise or upon MOB request.   Omega Durante LCSW, MSW Clinical Social Work: System Wide Float Coverage for [facility] N[facility] Clinical social worker [phone] 

## 2016-04-16 NOTE — Discharge Summary (Signed)
Obstetric Discharge Summary Reason for Admission: onset of labor Prenatal Procedures: NST and ultrasound Intrapartum Procedures: spontaneous vaginal delivery Postpartum Procedures: none Complications-Operative and Postpartum: none Hemoglobin  Date Value Ref Range Status  04/16/2016 10.2 (L) 12.0 - 15.0 g/dL Final   HCT  Date Value Ref Range Status  04/16/2016 31.2 (L) 36.0 - 46.0 % Final    Physical Exam:  General: alert and cooperative Lochia: appropriate Uterine Fundus: firm Incision: na DVT Evaluation: No evidence of DVT seen on physical exam.  Discharge Diagnoses: Term Pregnancy-delivered  Discharge Information: Date: 04/16/2016 Activity: pelvic rest Diet: routine Medications: None Condition: stable Instructions: refer to practice specific booklet Discharge to: home Follow-up Information    [facility]. Schedule an appointment as soon as possible for a visit in 6 week(s).   Specialty:  Obstetrics and Gynecology Contact information: [address] [place] [address] [phone]          Newborn Data: Live born female  Birth Weight: 7 lb 4.8 oz (3310 g) APGAR: 8, 9  Home with mother.  Juwana Thoreson A 04/16/2016, 1:58 PM

## 2016-08-07 ENCOUNTER — Encounter (HOSPITAL_COMMUNITY): Payer: Self-pay | Admitting: Emergency Medicine

## 2016-08-07 ENCOUNTER — Emergency Department (HOSPITAL_COMMUNITY)
Admission: EM | Admit: 2016-08-07 | Discharge: 2016-08-07 | Disposition: A | Discharge status: Home / Self Care | Payer: Managed Care, Other (non HMO) | Attending: Emergency Medicine | Admitting: Emergency Medicine

## 2016-08-07 ENCOUNTER — Emergency Department (HOSPITAL_COMMUNITY): Payer: Managed Care, Other (non HMO)

## 2016-08-07 DIAGNOSIS — J209 Acute bronchitis, unspecified: Secondary | ICD-10-CM | POA: Insufficient documentation

## 2016-08-07 DIAGNOSIS — R0981 Nasal congestion: Secondary | ICD-10-CM | POA: Diagnosis present

## 2016-08-07 MED ORDER — GUAIFENESIN 100 MG/5ML PO LIQD: 100.0000 mg | ORAL | 0 refills | Status: DC | PRN

## 2016-08-07 MED ORDER — ALBUTEROL SULFATE HFA 108 (90 BASE) MCG/ACT IN AERS
2.0000 | INHALATION_SPRAY | RESPIRATORY_TRACT | Status: DC | PRN
  Administered 2016-08-07: 2 via RESPIRATORY_TRACT
  Filled 2016-08-07: qty 6.7

## 2016-08-07 MED ORDER — PREDNISONE 10 MG PO TABS
20.0000 mg | ORAL_TABLET | Freq: Two times a day (BID) | ORAL | 0 refills | Status: DC
Start: 2016-08-07 — End: 2016-08-07

## 2016-08-07 MED ORDER — PREDNISONE 10 MG PO TABS: 20.0000 mg | ORAL_TABLET | Freq: Two times a day (BID) | ORAL | 0 refills | Status: DC

## 2016-08-07 MED ORDER — PREDNISONE 20 MG PO TABS
60.0000 mg | ORAL_TABLET | Freq: Once | ORAL | Status: AC
  Administered 2016-08-07: 60 mg via ORAL
  Filled 2016-08-07: qty 3

## 2016-08-07 MED ORDER — IPRATROPIUM-ALBUTEROL 0.5-2.5 (3) MG/3ML IN SOLN
3.0000 mL | Freq: Once | RESPIRATORY_TRACT | Status: AC
  Administered 2016-08-07: 3 mL via RESPIRATORY_TRACT
  Filled 2016-08-07: qty 3

## 2016-08-07 NOTE — ED Notes (Signed)
EDP at bedside  

## 2016-08-07 NOTE — ED Notes (Signed)
Breathing tx given, pt highly anxious. EDP aware

## 2016-08-07 NOTE — ED Provider Notes (Signed)
[facility]-EMERGENCY DEPT Provider Note   CSN: [id number] Arrival date & time: 08/07/16  1810  By signing my name below, I, Kristin [name], attest that this documentation has been prepared under the direction and in the presence of [name], NP. Electronically Signed: Angelene GiovanniEmmanuella [name], ED Scribe. 08/07/16. 6:51 PM.   History   Chief Complaint Chief Complaint  Patient presents with  . Nasal Congestion    cold symptoms    HPI Comments: Kristin [name] is a 30 y.o. female who presents to the Emergency Department complaining of gradually worsening moderate persistent moderate non-productive cough onset yesterday. She reports associated nasal congestion and mild difficulty breathing. No alleviating factors noted. Pt has not tried any medications PTA. She has an allergy to Amoxicillin. She reports sick contact at home with similar symptoms. 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 She denies a hx of asthma but states that she had breathing treatments when she was younger. She denies that she is a current smoker. She also denies any fever,chills, sore throat, ear pain, abdominal pain, nausea, vomiting, or any other symptoms.    The history is provided by the patient. No language interpreter was used.  Cough  This is a new problem. The current episode started yesterday. The problem has been gradually worsening. The cough is non-productive. There has been no fever. Associated symptoms include shortness of breath. Pertinent negatives include no chills, no ear pain and no sore throat. She has tried nothing for the symptoms. She is not a smoker.    Past Medical History:  Diagnosis Date  . Kidney stones    Kidney Stones  . Knee joint pain   . Pregnancy induced hypertension   . Syncope     Patient Active Problem List   Diagnosis Date Noted  . Vaginal delivery 04/15/2016  . BMI 40.0-44.9, adult (HCC) 04/14/2016  . Kidney stones--hx  03/24/2016  .  History of pregnancy induced hypertension--prior pregnancy 03/24/2016  . Positive GBS test 03/24/2016  . LGA (large for gestational age) fetus--hx with 1st pregnancy, 9+6 03/01/2016  . Allergy to amoxicillin 03/01/2016  . 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 Syncope 08/21/2015  . Hypokalemia 08/21/2015  . Anxiety 08/21/2015    Past Surgical History:  Procedure Laterality Date  . KIDNEY STONE SURGERY      OB History    Gravida Para Term Preterm AB Living   4 2 2  0 2 2   SAB TAB Ectopic Multiple Live Births   2     0 1       Home Medications    Prior to Admission medications   Medication Sig Start Date End Date Taking? Authorizing Provider  cephALEXin (KEFLEX) 500 MG capsule Take 500 mg by mouth 2 (two) times daily. 03/27/16   Historical Provider, MD  guaiFENesin (ROBITUSSIN) 100 MG/5ML liquid Take 5-10 mLs (100-200 mg total) by mouth every 4 (four) hours as needed for cough. 08/07/16   [name] [name] [name], NP  ibuprofen (ADVIL,MOTRIN) 600 MG tablet Take 1 tablet (600 mg total) by mouth every 6 (six) hours. 04/16/16   [name], MD  meclizine (ANTIVERT) 32 MG tablet Take 1 tablet (32 mg total) by mouth 3 (three) times daily as needed. 03/24/16   [name], CNM  NIFEdipine (PROCARDIA) 10 MG capsule Take 1 capsule (10 mg total) by mouth 4 (four) times daily as needed. Patient not taking: Reported on 04/14/2016 03/01/16   Chip BoerVicki  [name], CNM  predniSONE (DELTASONE) 10 MG tablet Take 2 tablets (20 mg total) by mouth 2 (two) times daily with a meal. 08/07/16   [name] Orlene Och, NP  Prenatal Vit-Fe Fumarate-FA (PRENATAL MULTIVITAMIN) TABS tablet Take 1 tablet by mouth daily at 12 noon.     Historical Provider, MD    Family History Family History  Problem Relation Age of Onset  . CVA Mother   . Hypertension Mother   . Diabetes Mellitus II Mother   . Congestive Heart Failure Mother   . CVA Father   . Hypertension Father   . Diabetes Mellitus II Father      Social History Social History  Substance Use Topics  . Smoking status: Never Smoker  . Smokeless tobacco: Never Used  . Alcohol use No     Allergies   Amoxicillin   Review of Systems Review of Systems  Constitutional: Negative for chills and fever.  HENT: Positive for congestion. Negative for ear pain and sore throat.   Respiratory: Positive for cough and shortness of breath.   Gastrointestinal: Negative for abdominal pain, nausea and vomiting.     Physical Exam Updated Vital Signs BP 149/78 (BP Location: Left Arm)   Pulse 77   Temp 99 F (37.2 C) (Oral)   Resp (!) 28   LMP 08/11/2015   SpO2 100%   Breastfeeding? Yes   Physical Exam  Constitutional: She is oriented to person, place, and time. She appears well-developed and well-nourished. No distress.  HENT:  Head: Normocephalic and atraumatic.  Right Ear: Tympanic membrane normal.  Left Ear: Tympanic membrane normal.  Mouth/Throat: Uvula is midline and oropharynx is clear and moist. No posterior oropharyngeal edema or posterior oropharyngeal erythema.  Eyes: Conjunctivae and EOM are normal. Pupils are equal, round, and reactive to light.  Sclera clear  Neck: Normal range of motion. Neck supple. No tracheal deviation present.  Cardiovascular: Normal rate and regular rhythm.   Pulmonary/Chest: Effort normal. No respiratory distress. She has decreased breath sounds.  Abdominal: She exhibits no distension.  Musculoskeletal: Normal range of motion.  Neurological: She is alert and oriented to person, place, and time.  Skin: Skin is warm and dry.  Psychiatric: She has a normal mood and affect. Her behavior is normal.  Nursing note and vitals reviewed.    ED Treatments / Results  DIAGNOSTIC STUDIES: Oxygen Saturation is 100% on RA, normal by my interpretation.    COORDINATION OF CARE: 6:48 PM- Pt advised of plan for treatment and pt agrees. Pt will receive chest x-ray for further evaluation. She will receive  duo neb here.    Labs (all labs ordered are listed, but only abnormal results are displayed) Labs Reviewed - No data to display   Radiology Dg Chest 2 View  Result Date: 08/07/2016 CLINICAL DATA:  Shortness of breath and dry cough EXAM: CHEST  2 VIEW COMPARISON:  Chest radiograph 08/20/2015 FINDINGS: Cardiomediastinal contours are normal. No pneumothorax or pleural effusion. No focal airspace consolidation or pulmonary edema. IMPRESSION: Clear lungs. Electronically Signed   By: Deatra Robinson M.D.   On: 08/07/2016 19:11    Procedures Procedures (including critical care time)  Medications Ordered in ED Medications  ipratropium-albuterol (DUONEB) 0.5-2.5 (3) MG/3ML nebulizer solution 3 mL (3 mLs Nebulization Given 08/07/16 1928)  predniSONE (DELTASONE) tablet 60 mg (60 mg Oral Given 08/07/16 1958)     Initial Impression / Assessment and Plan / ED Course  Kerrie Buffalo, NP has reviewed the triage vital signs and  the nursing notes.  Patient improved with neb treatment.  Pertinent labs & imaging results that were available during my care of the patient were reviewed by me and considered in my medical decision making (see chart for details).  Clinical Course    Pt CXR negative for acute infiltrate. Patients symptoms are consistent with URI, likely viral etiology. Discussed that antibiotics are not indicated for viral infections. Pt will be discharged with symptomatic treatment.  Verbalizes understanding and is agreeable with plan. Pt is hemodynamically stable & in NAD prior to dc.  Final Clinical Impressions(s) / ED Diagnoses   Final diagnoses:  Bronchitis, acute, with bronchospasm    New Prescriptions Discharge Medication List as of 08/07/2016  8:17 PM    START taking these medications   Details  guaiFENesin (ROBITUSSIN) 100 MG/5ML liquid Take 5-10 mLs (100-200 mg total) by mouth every 4 (four) hours as needed for cough., Starting Wed 08/07/2016, Print    predniSONE (DELTASONE)  10 MG tablet Take 2 tablets (20 mg total) by mouth 2 (two) times daily with a meal., Starting Wed 08/07/2016, Print       I personally performed the services described in this documentation, which was scribed in my presence. The recorded information has been reviewed and is accurate.    [address]Hope VidaliaM Madisynn Plair, NP 08/08/16 0157    Lyndal Pulleyaniel Knott, MD 08/08/16 [phone]

## 2016-08-07 NOTE — ED Notes (Signed)
See EDP assessment 

## 2016-08-07 NOTE — ED Triage Notes (Addendum)
C/o non-productive cough, chest congestion, and nasal congestion since yesterday.

## 2016-10-11 ENCOUNTER — Ambulatory Visit (INDEPENDENT_AMBULATORY_CARE_PROVIDER_SITE_OTHER): Payer: Self-pay | Admitting: Orthopaedic Surgery

## 2016-12-15 ENCOUNTER — Emergency Department (HOSPITAL_COMMUNITY)
Admission: EM | Admit: 2016-12-15 | Discharge: 2016-12-15 | Disposition: A | Discharge status: Home / Self Care | Payer: 59 | Attending: Emergency Medicine | Admitting: Emergency Medicine

## 2016-12-15 ENCOUNTER — Emergency Department (HOSPITAL_COMMUNITY): Payer: 59

## 2016-12-15 ENCOUNTER — Encounter (HOSPITAL_COMMUNITY): Payer: Self-pay | Admitting: Emergency Medicine

## 2016-12-15 DIAGNOSIS — R0782 Intercostal pain: Secondary | ICD-10-CM

## 2016-12-15 DIAGNOSIS — Z79899 Other long term (current) drug therapy: Secondary | ICD-10-CM | POA: Insufficient documentation

## 2016-12-15 DIAGNOSIS — R071 Chest pain on breathing: Secondary | ICD-10-CM | POA: Diagnosis present

## 2016-12-15 LAB — I-STAT TROPONIN, ED: Troponin i, poc: 0 ng/mL (ref 0.00–0.08)

## 2016-12-15 LAB — BASIC METABOLIC PANEL
Anion gap: 6 (ref 5–15)
BUN: 10 mg/dL (ref 6–20)
CO2: 24 mmol/L (ref 22–32)
CREATININE: 0.76 mg/dL (ref 0.44–1.00)
Calcium: 9.4 mg/dL (ref 8.9–10.3)
Chloride: 108 mmol/L (ref 101–111)
GFR calc Af Amer: >60 mL/min (ref >60)
GFR calc non Af Amer: >60 mL/min (ref >60)
Glucose, Bld: 84 mg/dL (ref 65–99)
POTASSIUM: 3.7 mmol/L (ref 3.5–5.1)
SODIUM: 138 mmol/L (ref 135–145)

## 2016-12-15 LAB — TROPONIN I: Troponin I: <0.03 ng/mL (ref <0.03)

## 2016-12-15 LAB — CBC
HEMATOCRIT: 38.3 % (ref 36.0–46.0)
Hemoglobin: 12.5 g/dL (ref 12.0–15.0)
MCH: 27.5 pg (ref 26.0–34.0)
MCHC: 32.6 g/dL (ref 30.0–36.0)
MCV: 84.2 fL (ref 78.0–100.0)
PLATELETS: 310 10*3/uL (ref 150–400)
RBC: 4.55 MIL/uL (ref 3.87–5.11)
RDW: 13.8 % (ref 11.5–15.5)
WBC: 10.1 10*3/uL (ref 4.0–10.5)

## 2016-12-15 LAB — D-DIMER, QUANTITATIVE (NOT AT ARMC): D-Dimer, Quant: 0.37 ug/mL-FEU (ref 0.00–0.50)

## 2016-12-15 MED ORDER — BENZONATATE 100 MG PO CAPS: 100.0000 mg | ORAL_CAPSULE | Freq: Three times a day (TID) | ORAL | 0 refills | Status: DC

## 2016-12-15 NOTE — ED Provider Notes (Signed)
[facility]-EMERGENCY DEPT Provider Note   CSN: [id number] Arrival date & time: 12/15/16  1605     History   Chief Complaint Chief Complaint  Patient presents with  . Chest Pain    HPI Kristin [name] is a 31 y.o. female.  HPI   31 year old female presents today with complaints of chest pain.  Patient notes that over the last month she has had episodes of her heart racing and feeling nauseous.  She notes that today at 2 PM she had similar symptoms associated with sharp stabbing chest pain.  She notes that this is worse on the left side, and worse with deep inspiration.  She notes that symptoms resolved, but then returned again several hours later.  Prior to my evaluation patient had a chest x-ray noted pain with inspiration at that time.  Patient notes over the last 4 days she has had runny nose, congestion, and productive cough.  She denies any fever at home.  She denies any dizziness, abdominal pain.  Patient denies any significant personal cardiac history, but notes that close family members have died at young age secondary to MI.  Patient is acutely concerned about her heart.  She is a non-smoker, does not take birth control, no history of DVTs or PEs, no recent surgery, no significant risk factors for PE or DVT.  Patient does note she had some swelling in her left leg which she has had in the past, not currently swollen.  She denies any chest pain at the time of evaluation.    Past Medical History:  Diagnosis Date  . Kidney stones    Kidney Stones  . Knee joint pain   . Pregnancy induced hypertension   . Syncope     Patient Active Problem List   Diagnosis Date Noted  . Vaginal delivery 04/15/2016  . BMI 40.0-44.9, adult (HCC) 04/14/2016  . Kidney stones--hx  03/24/2016  . History of pregnancy induced hypertension--prior pregnancy 03/24/2016  . Positive GBS test 03/24/2016  . LGA (large for gestational age) fetus--hx with 1st pregnancy, 9+6 03/01/2016  . Allergy to amoxicillin  03/01/2016  . 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 Syncope 08/21/2015  . Hypokalemia 08/21/2015  . Anxiety 08/21/2015    Past Surgical History:  Procedure Laterality Date  . KIDNEY STONE SURGERY      OB History    Gravida Para Term Preterm AB Living   4 2 2  0 2 2   SAB TAB Ectopic Multiple Live Births   2     0 1       Home Medications    Prior to Admission medications   Medication Sig Start Date End Date Taking? Authorizing Provider  Albuterol Sulfate (PROVENTIL HFA IN) Inhale 2 puffs into the lungs daily as needed (sob).   Yes Historical Provider, MD  Multiple Vitamins-Minerals (MULTIVITAMIN ADULT PO) Take 1 tablet by mouth daily.   Yes Historical Provider, MD  benzonatate (TESSALON) 100 MG capsule Take 1 capsule (100 mg total) by mouth every 8 (eight) hours. 12/15/16   Eyvonne Mechanic, PA-C  NIFEdipine (PROCARDIA) 10 MG capsule Take 1 capsule (10 mg total) by mouth 4 (four) times daily as needed. Patient not taking: Reported on 04/14/2016 03/01/16   Nigel Bridgeman, CNM    Family History Family History  Problem Relation Age of Onset  . CVA Mother   . Hypertension Mother   . Diabetes Mellitus II Mother   . Congestive Heart Failure Mother   .  CVA Father   . Hypertension Father   . Diabetes Mellitus II Father     Social History Social History  Substance Use Topics  . Smoking status: Never Smoker  . Smokeless tobacco: Never Used  . Alcohol use No     Allergies   Amoxicillin   Review of Systems Review of Systems 10 Systems reviewed and are negative for acute change except as noted in the HPI.  Physical Exam Updated Vital Signs BP 139/77 (BP Location: Right Arm)   Pulse 76   Temp 98.9 F (37.2 C)   Resp 17   LMP 11/15/2016   SpO2 99%   Physical Exam  Constitutional: She is oriented to person, place, and time. She appears well-developed and well-nourished.  HENT:  Head: Normocephalic and atraumatic.  Eyes: Conjunctivae are  normal. Pupils are equal, round, and reactive to light. Right eye exhibits no discharge. Left eye exhibits no discharge. No scleral icterus.  Neck: Normal range of motion. No JVD present. No tracheal deviation present.  Cardiovascular: Normal rate, regular rhythm, normal heart sounds and intact distal pulses.  Exam reveals no gallop and no friction rub.   No murmur heard. Pulmonary/Chest: Effort normal and breath sounds normal. No stridor. No respiratory distress. She has no wheezes. She has no rales.  Tenderness along the left anterior and lateral chest wall just underneath the breast  Abdominal: Soft.  Musculoskeletal: She exhibits no edema.  No lower extremity swelling or edema  Neurological: She is alert and oriented to person, place, and time. Coordination normal.  Skin: Skin is warm.  Psychiatric: She has a normal mood and affect. Her behavior is normal. Judgment and thought content normal.  Nursing note and vitals reviewed.    ED Treatments / Results  Labs (all labs ordered are listed, but only abnormal results are displayed) Labs Reviewed  BASIC METABOLIC PANEL  CBC  D-DIMER, QUANTITATIVE (NOT AT [facility])  TROPONIN I  I-STAT TROPOININ, ED    EKG  EKG Interpretation  Date/Time:  Sunday December 15 2016 16:11:56 EDT Ventricular Rate:  78 PR Interval:    QRS Duration: 91 QT Interval:  383 QTC Calculation: 437 R Axis:   66 Text Interpretation:  Sinus rhythm Low voltage, precordial leads Baseline wander in lead(s) III V2 When compared with ECG of 08/20/2015, No significant change was found Confirmed by [facility]  MD, DAVID ([id number]) on 12/15/2016 5:10:20 PM       Radiology Dg Chest 2 View  Result Date: 12/15/2016 CLINICAL DATA:  Chest pain and shortness of breath EXAM: CHEST  2 VIEW COMPARISON:  August 07, 2016 FINDINGS: The heart size and mediastinal contours are within normal limits. Both lungs are clear. The visualized skeletal structures are unremarkable. IMPRESSION: No  active cardiopulmonary disease. Electronically Signed   By: Gerome Sam III M.D   On: 12/15/2016 16:40    Procedures Procedures (including critical care time)  Medications Ordered in ED Medications - No data to display   Initial Impression / Assessment and Plan / ED Course  I have reviewed the triage vital signs and the nursing notes.  Pertinent labs & imaging results that were available during my care of the patient were reviewed by me and considered in my medical decision making (see chart for details).      Final Clinical Impressions(s) / ED Diagnoses   Final diagnoses:  Intercostal pain   Labs: I-STAT troponin, BMP, CBC, d-dimer  Imaging: DG Chest 2 View  Consults:  Therapeutics:  Discharge  Meds:   Assessment/Plan: 31 year old female presents today with complaints of chest pain.  This seems slightly pleuritic in nature, worse with palpation of the chest wall.  Likely secondary to patient coughing with viral URI.  She is afebrile, well-appearing with clear lung sounds.  Her vital signs are reassuring, very low suspicion for ACS with a heart score of 1 and 2 negative troponins.  Patient has no significant changes on her EKG.  Low suspicion for PE, negative d-dimer. No signs or symptoms of dissection.  Patient satisfied with today's workup, she is instructed to use ibuprofen as needed for discomfort, she will be given cough medication, primary care follow-up and strict return precautions.  She verbalized understanding and agreement to today's plan.   New Prescriptions Discharge Medication List as of 12/15/2016  8:57 PM    START taking these medications   Details  benzonatate (TESSALON) 100 MG capsule Take 1 capsule (100 mg total) by mouth every 8 (eight) hours., Starting Sun 12/15/2016, Print         Eyvonne MechanicJeffrey Sashia Campas, PA-C 12/15/16 2147    Gerhard Munchobert Lockwood, MD 12/16/16 [phone]

## 2016-12-15 NOTE — ED Triage Notes (Signed)
Pt reports she began to have CP and SOB today at 1400. Initially thought it may have been something she ate, but mother recently died of an MI. Also reports dizziness last week.

## 2016-12-15 NOTE — ED Notes (Signed)
Pt states dizziness after drawing the lab, denies nausea.

## 2016-12-15 NOTE — ED Notes (Signed)
ED Provider at bedside. 

## 2016-12-15 NOTE — Discharge Instructions (Signed)
Please read attached information. If you experience any new or worsening signs or symptoms please return to the emergency room for evaluation. Please follow-up with your primary care provider or specialist as discussed. Please use medication prescribed only as directed and discontinue taking if you have any concerning signs or symptoms.   °

## 2016-12-31 ENCOUNTER — Emergency Department (HOSPITAL_COMMUNITY)
Admission: EM | Admit: 2016-12-31 | Discharge: 2016-12-31 | Disposition: A | Discharge status: Home / Self Care | Payer: 59 | Attending: Emergency Medicine | Admitting: Emergency Medicine

## 2016-12-31 ENCOUNTER — Encounter (HOSPITAL_COMMUNITY): Payer: Self-pay | Admitting: Family Medicine

## 2016-12-31 DIAGNOSIS — M79601 Pain in right arm: Secondary | ICD-10-CM

## 2016-12-31 DIAGNOSIS — M25511 Pain in right shoulder: Secondary | ICD-10-CM | POA: Insufficient documentation

## 2016-12-31 DIAGNOSIS — M62838 Other muscle spasm: Secondary | ICD-10-CM | POA: Insufficient documentation

## 2016-12-31 MED ORDER — CYCLOBENZAPRINE HCL 10 MG PO TABS: 10.0000 mg | ORAL_TABLET | Freq: Two times a day (BID) | ORAL | 0 refills | Status: DC | PRN

## 2016-12-31 MED ORDER — NAPROXEN 500 MG PO TABS
500.0000 mg | ORAL_TABLET | Freq: Once | ORAL | Status: AC
  Administered 2016-12-31: 500 mg via ORAL
  Filled 2016-12-31: qty 1

## 2016-12-31 MED ORDER — CYCLOBENZAPRINE HCL 10 MG PO TABS
10.0000 mg | ORAL_TABLET | Freq: Once | ORAL | Status: AC
  Administered 2016-12-31: 10 mg via ORAL
  Filled 2016-12-31: qty 1

## 2016-12-31 MED ORDER — ENOXAPARIN SODIUM 120 MG/0.8ML ~~LOC~~ SOLN
1.0000 mg/kg | Freq: Once | SUBCUTANEOUS | Status: AC
  Administered 2016-12-31: 110 mg via SUBCUTANEOUS
  Filled 2016-12-31: qty 0.8

## 2016-12-31 MED ORDER — NAPROXEN 500 MG PO TABS: 500.0000 mg | ORAL_TABLET | Freq: Two times a day (BID) | ORAL | 0 refills | Status: DC

## 2016-12-31 MED ORDER — DICLOFENAC SODIUM 1 % TD GEL
2.0000 g | Freq: Four times a day (QID) | TRANSDERMAL | 0 refills | Status: DC
Start: 2016-12-31 — End: 2020-06-13

## 2016-12-31 NOTE — Discharge Instructions (Signed)
Follow-up to get the ultrasound of your upper extremity done tomorrow. Start taking the anti-inflammatories and muscle relaxers. Also can use the cream on area that hurts. He can also try heat. Avoid heavy lifting with that arm.

## 2016-12-31 NOTE — ED Triage Notes (Addendum)
Patient reports two weeks she started experiencing right shoulder pain without no known injury. Today, pt developed right arm/hand swelling today. Pt was seen at [facility] and referred to have further testing for a blood clot. Denies chest pain and shortness of breath.

## 2017-01-01 ENCOUNTER — Ambulatory Visit (HOSPITAL_COMMUNITY): Admission: RE | Admit: 2017-01-01 | Payer: 59 | Source: Ambulatory Visit

## 2017-01-01 ENCOUNTER — Ambulatory Visit (HOSPITAL_COMMUNITY)
Admission: RE | Admit: 2017-01-01 | Discharge: 2017-01-01 | Disposition: A | Discharge status: Home / Self Care | Payer: 59 | Source: Ambulatory Visit | Attending: Emergency Medicine | Admitting: Emergency Medicine

## 2017-01-01 DIAGNOSIS — M79601 Pain in right arm: Secondary | ICD-10-CM | POA: Diagnosis not present

## 2017-01-01 NOTE — ED Provider Notes (Signed)
[facility]-EMERGENCY DEPT Provider Note   CSN: [id number] Arrival date & time: 12/31/16  1837     History   Chief Complaint Chief Complaint  Patient presents with  . Arm Swelling  . Shoulder Pain    HPI Kristin [name] is a 31 y.o. female.  Patient was seen in urgent care and encouraged to come here to rule out right upper extremity blood clot. Patient denies any recent birth control, travel, trauma or procedures on that arm. She does have significant family history of multiple family members with blood clots including her mother and sister and grandmother. She herself only has significant history of kidney stones. She takes no medications regularly. She tried taking ibuprofen one time for the pain without improvement.   The history is provided by the patient.  Shoulder Pain   This is a new problem. Episode onset: 2 weeks. The problem occurs constantly. The problem has been gradually worsening. The pain is present in the right shoulder (radiates down the right arm and some tingling in the right hand today.  also felt that the right arm was swollen today.). The quality of the pain is described as sharp, aching and constant. The pain is at a severity of 8/10. The pain is severe. Associated symptoms include limited range of motion, stiffness and tingling. The symptoms are aggravated by activity. She has tried nothing for the symptoms. The treatment provided no relief. There has been no history of extremity trauma.    Past Medical History:  Diagnosis Date  . Kidney stones    Kidney Stones  . Knee joint pain   . Pregnancy induced hypertension   . Syncope     Patient Active Problem List   Diagnosis Date Noted  . Vaginal delivery 04/15/2016  . BMI 40.0-44.9, adult (HCC) 04/14/2016  . Kidney stones--hx  03/24/2016  . History of pregnancy induced hypertension--prior pregnancy 03/24/2016  . Positive GBS test 03/24/2016  . LGA (large for gestational age) fetus--hx with 1st pregnancy, 9+6  03/01/2016  . Allergy to amoxicillin 03/01/2016  . 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 Syncope 08/21/2015  . Hypokalemia 08/21/2015  . Anxiety 08/21/2015    Past Surgical History:  Procedure Laterality Date  . KIDNEY STONE SURGERY      OB History    Gravida Para Term Preterm AB Living   0 2 2   SAB TAB Ectopic Multiple Live Births   2     0 1       Home Medications    Prior to Admission medications   Medication Sig Start Date End Date Taking? Authorizing Provider  Albuterol Sulfate (PROVENTIL HFA IN) Inhale 2 puffs into the lungs daily as needed (sob).   Yes Historical Provider, MD  Multiple Vitamins-Minerals (MULTIVITAMIN ADULT PO) Take 1 tablet by mouth daily.   Yes Historical Provider, MD  benzonatate (TESSALON) 100 MG capsule Take 1 capsule (100 mg total) by mouth every 8 (eight) hours. Patient not taking: Reported on 12/31/2016 12/15/16   Eyvonne Mechanic, PA-C  cyclobenzaprine (FLEXERIL) 10 MG tablet Take 1 tablet (10 mg total) by mouth 2 (two) times daily as needed for muscle spasms. 12/31/16   Gwyneth Sprout, MD  diclofenac sodium (VOLTAREN) 1 % GEL Apply 2 g topically 4 (four) times daily. 12/31/16   Gwyneth Sprout, MD  naproxen (NAPROSYN) 500 MG tablet Take 1 tablet (500 mg total) by mouth 2 (two) times daily. 12/31/16   Gwyneth Sprout, MD  NIFEdipine (  PROCARDIA) 10 MG capsule Take 1 capsule (10 mg total) by mouth 4 (four) times daily as needed. Patient not taking: Reported on 04/14/2016 03/01/16   Nigel Bridgeman, CNM    Family History Family History  Problem Relation Age of Onset  . CVA Mother   . Hypertension Mother   . Diabetes Mellitus II Mother   . Congestive Heart Failure Mother   . CVA Father   . Hypertension Father   . Diabetes Mellitus II Father     Social History Social History  Substance Use Topics  . Smoking status: Never Smoker  . Smokeless tobacco: Never Used  . Alcohol use No     Allergies     Amoxicillin   Review of Systems Review of Systems  Musculoskeletal: Positive for stiffness.  Neurological: Positive for tingling.  All other systems reviewed and are negative.    Physical Exam Updated Vital Signs BP (!) 145/94 (BP Location: Left Arm)   Pulse 69   Temp 98.1 F (36.7 C) (Oral)   Resp 20   Ht  (1.651 m)   Wt 238 lb 2 oz (108 kg)   LMP 12/17/2016   SpO2 100%   BMI 39.63 kg/m   Physical Exam  Constitutional: She is oriented to person, place, and time. She appears well-developed and well-nourished. No distress.  HENT:  Head: Normocephalic and atraumatic.  Mouth/Throat: Oropharynx is clear and moist.  Eyes: Conjunctivae and EOM are normal. Pupils are equal, round, and reactive to light.  Neck: Normal range of motion. Neck supple.  Cardiovascular: Normal rate, regular rhythm and intact distal pulses.   No murmur heard. Pulmonary/Chest: Effort normal and breath sounds normal. No respiratory distress. She has no wheezes. She has no rales.  Musculoskeletal: She exhibits tenderness. She exhibits no edema.       Arms: Significant tenderness with palpation over the right trapezius with spasm palpated. Pulses are equal in bilateral upper extremities. No notable swelling to the right upper extremity compared to the left possible mild swelling in the right hand. Strength is 5 out of 5 bilaterally.  Neurological: She is alert and oriented to person, place, and time.  Skin: Skin is warm and dry. Capillary refill takes less than 2 seconds. No rash noted. No erythema.  Psychiatric: She has a normal mood and affect. Her behavior is normal.  Nursing note and vitals reviewed.    ED Treatments / Results  Labs (all labs ordered are listed, but only abnormal results are displayed) Labs Reviewed - No data to display  EKG  EKG Interpretation None       Radiology No results found.  Procedures Procedures (including critical care time)  Medications Ordered in  ED Medications  enoxaparin (LOVENOX) injection 110 mg (110 mg Subcutaneous Given 12/31/16 2224)  naproxen (NAPROSYN) tablet 500 mg (500 mg Oral Given 12/31/16 2224)  cyclobenzaprine (FLEXERIL) tablet 10 mg (10 mg Oral Given 12/31/16 2224)     Initial Impression / Assessment and Plan / ED Course  I have reviewed the triage vital signs and the nursing notes.  Pertinent labs & imaging results that were available during my care of the patient were reviewed by me and considered in my medical decision making (see chart for details).     Patient here due to pain in her right shoulder blade was seen in urgent care and sent here for rule out of DVT. Patient has risk factors for DVT of the upper extremity except for family history. Pulses are  intact and minimal if no swelling in the right upper extremity. Significant tenderness over the trapezius. Feel most likely trapezius spasm. However patient given a dose of Lovenox and will have an upper extremity DVT ruled out in the morning. She was also given anti-inflammatories and muscle relaxers as well as follow-up if symptoms do not improve. Shortness of breath or chest pain and no recent procedures to the upper extremity.  Final Clinical Impressions(s) / ED Diagnoses   Final diagnoses:  Acute pain of right shoulder  Trapezius muscle spasm    New Prescriptions Discharge Medication List as of 12/31/2016  9:57 PM    START taking these medications   Details  cyclobenzaprine (FLEXERIL) 10 MG tablet Take 1 tablet (10 mg total) by mouth 2 (two) times daily as needed for muscle spasms., Starting Tue 12/31/2016, Print    naproxen (NAPROSYN) 500 MG tablet Take 1 tablet (500 mg total) by mouth 2 (two) times daily., Starting Tue 12/31/2016, Print         Gwyneth Sprout, MD 01/01/17 [phone]

## 2017-01-01 NOTE — Progress Notes (Signed)
*  PRELIMINARY RESULTS* Vascular Ultrasound Right upper extremity venous duplex has been completed.  Preliminary findings: No evidence of DVT or superficial thrombosis.  Farrel Demark, RDMS, RVT  01/01/2017, 1:19 PM

## 2017-01-15 ENCOUNTER — Ambulatory Visit (HOSPITAL_COMMUNITY)
Admission: EM | Admit: 2017-01-15 | Discharge: 2017-01-15 | Disposition: A | Discharge status: Nursing Home (Includes ICF) | Payer: 59 | Source: Home / Self Care

## 2017-01-15 ENCOUNTER — Encounter (HOSPITAL_COMMUNITY): Payer: Self-pay | Admitting: Emergency Medicine

## 2017-01-15 ENCOUNTER — Encounter (HOSPITAL_COMMUNITY): Payer: Self-pay

## 2017-01-15 ENCOUNTER — Emergency Department (HOSPITAL_COMMUNITY)
Admission: EM | Admit: 2017-01-15 | Discharge: 2017-01-15 | Disposition: A | Discharge status: Home / Self Care | Payer: 59 | Attending: Emergency Medicine | Admitting: Emergency Medicine

## 2017-01-15 ENCOUNTER — Ambulatory Visit (INDEPENDENT_AMBULATORY_CARE_PROVIDER_SITE_OTHER): Payer: 59

## 2017-01-15 ENCOUNTER — Emergency Department (HOSPITAL_COMMUNITY): Payer: 59

## 2017-01-15 DIAGNOSIS — R197 Diarrhea, unspecified: Secondary | ICD-10-CM | POA: Diagnosis not present

## 2017-01-15 DIAGNOSIS — M545 Low back pain, unspecified: Secondary | ICD-10-CM

## 2017-01-15 DIAGNOSIS — R112 Nausea with vomiting, unspecified: Secondary | ICD-10-CM

## 2017-01-15 DIAGNOSIS — R3129 Other microscopic hematuria: Secondary | ICD-10-CM

## 2017-01-15 DIAGNOSIS — N390 Urinary tract infection, site not specified: Secondary | ICD-10-CM

## 2017-01-15 DIAGNOSIS — Z3202 Encounter for pregnancy test, result negative: Secondary | ICD-10-CM | POA: Diagnosis not present

## 2017-01-15 DIAGNOSIS — R109 Unspecified abdominal pain: Secondary | ICD-10-CM | POA: Diagnosis not present

## 2017-01-15 DIAGNOSIS — R8281 Pyuria: Secondary | ICD-10-CM

## 2017-01-15 DIAGNOSIS — Z79899 Other long term (current) drug therapy: Secondary | ICD-10-CM | POA: Insufficient documentation

## 2017-01-15 HISTORY — DX: Calculus of kidney: N20.0

## 2017-01-15 LAB — POCT URINALYSIS DIP (DEVICE)
Bilirubin Urine: NEGATIVE
Glucose, UA: NEGATIVE mg/dL
KETONES UR: NEGATIVE mg/dL
Nitrite: NEGATIVE
PH: 6 (ref 5.0–8.0)
PROTEIN: NEGATIVE mg/dL
Specific Gravity, Urine: >=1.03 (ref 1.005–1.030)
Urobilinogen, UA: 0.2 mg/dL (ref 0.0–1.0)

## 2017-01-15 LAB — CBC WITH DIFFERENTIAL/PLATELET
BASOS ABS: 0 10*3/uL (ref 0.0–0.1)
Basophils Relative: 0 %
EOS PCT: 1 %
Eosinophils Absolute: 0.1 10*3/uL (ref 0.0–0.7)
HEMATOCRIT: 38.3 % (ref 36.0–46.0)
Hemoglobin: 12.3 g/dL (ref 12.0–15.0)
LYMPHS PCT: 21 %
Lymphs Abs: 1.4 10*3/uL (ref 0.7–4.0)
MCH: 26.9 pg (ref 26.0–34.0)
MCHC: 32.1 g/dL (ref 30.0–36.0)
MCV: 83.8 fL (ref 78.0–100.0)
MONO ABS: 0.8 10*3/uL (ref 0.1–1.0)
MONOS PCT: 12 %
NEUTROS ABS: 4.4 10*3/uL (ref 1.7–7.7)
Neutrophils Relative %: 66 %
PLATELETS: 251 10*3/uL (ref 150–400)
RBC: 4.57 MIL/uL (ref 3.87–5.11)
RDW: 13.5 % (ref 11.5–15.5)
WBC: 6.7 10*3/uL (ref 4.0–10.5)

## 2017-01-15 LAB — COMPREHENSIVE METABOLIC PANEL
ALT: 19 U/L (ref 14–54)
ANION GAP: 9 (ref 5–15)
AST: 23 U/L (ref 15–41)
Albumin: 3.7 g/dL (ref 3.5–5.0)
Alkaline Phosphatase: 63 U/L (ref 38–126)
BILIRUBIN TOTAL: 0.7 mg/dL (ref 0.3–1.2)
BUN: 10 mg/dL (ref 6–20)
CHLORIDE: 106 mmol/L (ref 101–111)
CO2: 21 mmol/L — ABNORMAL LOW (ref 22–32)
Calcium: 8.9 mg/dL (ref 8.9–10.3)
Creatinine, Ser: 0.58 mg/dL (ref 0.44–1.00)
Glucose, Bld: 80 mg/dL (ref 65–99)
POTASSIUM: 3.4 mmol/L — AB (ref 3.5–5.1)
Sodium: 136 mmol/L (ref 135–145)
TOTAL PROTEIN: 7.2 g/dL (ref 6.5–8.1)

## 2017-01-15 LAB — POCT PREGNANCY, URINE: PREG TEST UR: NEGATIVE

## 2017-01-15 MED ORDER — SODIUM CHLORIDE 0.9 % IV BOLUS (SEPSIS)
1000.0000 mL | Freq: Once | INTRAVENOUS | Status: AC
  Administered 2017-01-15: 1000 mL via INTRAVENOUS

## 2017-01-15 MED ORDER — CYCLOBENZAPRINE HCL 10 MG PO TABS: 10.0000 mg | ORAL_TABLET | Freq: Two times a day (BID) | ORAL | 0 refills | Status: DC | PRN

## 2017-01-15 MED ORDER — KETOROLAC TROMETHAMINE 15 MG/ML IJ SOLN
15.0000 mg | Freq: Once | INTRAMUSCULAR | Status: AC
  Administered 2017-01-15: 15 mg via INTRAVENOUS
  Filled 2017-01-15: qty 1

## 2017-01-15 MED ORDER — ONDANSETRON 4 MG PO TBDP: 4.0000 mg | ORAL_TABLET | Freq: Three times a day (TID) | ORAL | 0 refills | Status: DC | PRN

## 2017-01-15 NOTE — ED Triage Notes (Signed)
Pt sent from Urgent Care for kidney stones and possible UTI. She reports left flank pain, nausea and exhaustion. Sent here to have a CT scan done.

## 2017-01-15 NOTE — ED Notes (Signed)
Patient transported to CT 

## 2017-01-15 NOTE — ED Provider Notes (Signed)
[facility]-EMERGENCY DEPT Provider Note   CSN: [id number] Arrival date & time: 01/15/17  1233  By signing my name below, I, Marnette Burgess Long, attest that this documentation has been prepared under the direction and in the presence of Alvira Monday, MD. Electronically Signed: Christian Mate, Scribe. 01/15/2017. 3:24 PM.  History   Chief Complaint Chief Complaint  Patient presents with  . Nephrolithiasis   The history is provided by the patient and medical records. No language interpreter was used.    HPI Comments:  Kristin [name] is a 31 y.o. female with a PMHx of prior Renal Calculi and Syncope, who presents to the Emergency Department from [facility] for CT scan following left flank pain that began yesterday. She reports the left-sided flank pain comes in "waves" beginning yesterday with associated nausea, both worsening today. She also states feeling exhausted. 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 Pt has associated symptoms of emesis x2, lower back pain, and diarrhea x6. She tried ASA at home with minimal relief of her pain. No recent travel, new medication, or strange food consumed. Pt denies fevers, urgency, frequency, hematuria, dysuria, difficulty urinating, vaginal bleeding, vaginal discharge, abdominal pain, constipation, and any other complaints at this time.   Past Medical History:  Diagnosis Date  . Kidney stone   . Kidney stones    Kidney Stones  . Knee joint pain   . Pregnancy induced hypertension   . Syncope    Patient Active Problem List   Diagnosis Date Noted  . Vaginal delivery 04/15/2016  . BMI 40.0-44.9, adult (HCC) 04/14/2016  . Kidney stones--hx  03/24/2016  . History of pregnancy induced hypertension--prior pregnancy 03/24/2016  . Positive GBS test 03/24/2016  . LGA (large for gestational age) fetus--hx with 1st pregnancy, 9+6 03/01/2016  . Allergy to amoxicillin 03/01/2016  . HGSIL (high grade  squamous intraepithelial dysplasia) 03/01/2016  . Syncope 08/21/2015  . Hypokalemia 08/21/2015  . Anxiety 08/21/2015   Past Surgical History:  Procedure Laterality Date  . KIDNEY STONE SURGERY     OB History    Gravida Para Term Preterm AB Living   0 2 2   SAB TAB Ectopic Multiple Live Births   2     0 1     Home Medications    Prior to Admission medications   Medication Sig Start Date End Date Taking? Authorizing Provider  Albuterol Sulfate (PROVENTIL HFA IN) Inhale 2 puffs into the lungs daily as needed (sob).    Historical Provider, MD  cyclobenzaprine (FLEXERIL) 10 MG tablet Take 1 tablet (10 mg total) by mouth 2 (two) times daily as needed for muscle spasms. 01/15/17   Alvira Monday, MD  diclofenac sodium (VOLTAREN) 1 % GEL Apply 2 g topically 4 (four) times daily. 12/31/16   Gwyneth Sprout, MD  Multiple Vitamins-Minerals (MULTIVITAMIN ADULT PO) Take 1 tablet by mouth daily.    Historical Provider, MD  NIFEdipine (PROCARDIA) 10 MG capsule Take 1 capsule (10 mg total) by mouth 4 (four) times daily as needed. Patient not taking: Reported on 04/14/2016 03/01/16   Nigel Bridgeman, CNM  ondansetron (ZOFRAN ODT) 4 MG disintegrating tablet Take 1 tablet (4 mg total) by mouth every 8 (eight) hours as needed for nausea or vomiting. 01/15/17   Alvira Monday, MD    Family History Family History  Problem Relation Age of Onset  . CVA Mother   . Hypertension Mother   .  Diabetes Mellitus II Mother   . Congestive Heart Failure Mother   . CVA Father   . Hypertension Father   . Diabetes Mellitus II Father     Social History Social History  Substance Use Topics  . Smoking status: Never Smoker  . Smokeless tobacco: Never Used  . Alcohol use No     Allergies   Amoxicillin   Review of Systems Review of Systems  Constitutional: Negative for fever.  HENT: Negative for sore throat.   Eyes: Negative for visual disturbance.  Respiratory: Negative for cough and shortness of  breath.   Cardiovascular: Negative for chest pain.  Gastrointestinal: Positive for diarrhea, nausea and vomiting. Negative for abdominal pain and constipation.  Genitourinary: Positive for flank pain. Negative for difficulty urinating, dysuria, frequency, hematuria, urgency, vaginal bleeding and vaginal discharge.  Musculoskeletal: Positive for back pain. Negative for neck pain.  Skin: Negative for rash.  Neurological: Negative for syncope and headaches.     Physical Exam Updated Vital Signs BP (!) 153/90 (BP Location: Right Arm)   Pulse 66   Temp 97.9 F (36.6 C) (Oral)   Resp 20   LMP 01/14/2017 (Exact Date)   SpO2 98%   Physical Exam  Constitutional: She is oriented to person, place, and time. She appears well-developed and well-nourished. No distress.  HENT:  Head: Normocephalic and atraumatic.  Eyes: Conjunctivae and EOM are normal.  Neck: Normal range of motion.  Cardiovascular: Normal rate, regular rhythm, normal heart sounds and intact distal pulses.  Exam reveals no gallop and no friction rub.   No murmur heard. Pulmonary/Chest: Effort normal and breath sounds normal. No respiratory distress. She has no wheezes. She has no rales.  Abdominal: Soft. She exhibits no distension. There is tenderness (lower left flank tenderness). There is no guarding.  Musculoskeletal: She exhibits tenderness. She exhibits no edema (left lower back).  Neurological: She is alert and oriented to person, place, and time.  Skin: Skin is warm and dry. No rash noted. She is not diaphoretic. No erythema.  Nursing note and vitals reviewed.    ED Treatments / Results  DIAGNOSTIC STUDIES:  Oxygen Saturation is 98% on RA, normal by my interpretation.    COORDINATION OF CARE:  3:20 PM Discussed treatment plan with pt at bedside including urine culture, blood work, pain medication, muscle relaxant, anti-emetic, and CT Renal and pt agreed to plan.  Labs (all labs ordered are listed, but only  abnormal results are displayed) Labs Reviewed  COMPREHENSIVE METABOLIC PANEL - Abnormal; Notable for the following:       Result Value   Potassium 3.4 (*)    CO2 21 (*)    All other components within normal limits  URINE CULTURE  CBC WITH DIFFERENTIAL/PLATELET    EKG  EKG Interpretation None       Radiology Dg Abd 1 View  Result Date: 01/15/2017 CLINICAL DATA:  Left flank pain. EXAM: ABDOMEN - 1 VIEW COMPARISON:  Ultrasound 08/26/1999 16.  CT rib 02/22/2014. FINDINGS: 9 mm calcific density lower pole left kidney. No evidence of ureteral stone. No bowel distention. No acute bony abnormality. IMPRESSION: 9 mm calcific density noted over the lower pole left kidney consistent with left nephrolithiasis. No evidence of urolithiasis. Electronically Signed   By: Maisie Fus  Register   On: 01/15/2017 12:15   Ct Renal Stone Study  Result Date: 01/15/2017 CLINICAL DATA:  Left flank pain, history of kidney stones EXAM: CT ABDOMEN AND PELVIS WITHOUT CONTRAST TECHNIQUE: Multidetector CT imaging of  the abdomen and pelvis was performed following the standard protocol without IV contrast. COMPARISON:  CT abdomen pelvis of 12/22/2012 FINDINGS: Lower chest: The lung bases are clear. The heart is mildly enlarged. No pericardial effusion is seen. Hepatobiliary: The liver is unremarkable in the unenhanced state. No focal hepatic lesion is seen. No calcified gallstones are noted. Pancreas: The pancreas is not optimally seen on this unenhanced study but no pancreatic lesion is noted. The pancreatic duct is not dilated. Spleen: The spleen is unremarkable. Adrenals/Urinary Tract: The adrenal glands appear symmetrical and normal. No right renal calculus is seen. However there is a left lower pole renal calculus of 5 x 13 x 10 mm which is not creating obstruction. The ureters are normal in caliber. No ureteral calculus is seen. The urinary bladder is not well distended but no abnormality is noted. Stomach/Bowel: The  stomach is not well distended. No small bowel distention is seen. The colon is largely decompressed. The terminal ileum is not well seen but no abnormality is noted. The appendix appears to course posteriorly with no abnormality evident. No inflammatory process is seen. Vascular/Lymphatic: The abdominal aorta is normal in caliber. No adenopathy is noted. Reproductive: The uterus is normal in size. The adnexa are difficult assess on this study with non-opacified bowel within appear to be follicles bilaterally. No free fluid is seen within the pelvis. Other: None. Musculoskeletal: The lumbar vertebrae are normal alignment. 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 Disc spaces appear normal. The SI joints are corticated. IMPRESSION: 1. No hydronephrosis. There is a 5 x 13 x 10 mm nonobstructing left lower pole renal calculus present. No right renal or ureteral calculi are seen. 2. The appendix is grossly unremarkable as is the terminal ileum both of which are not well seen without oral contrast. 3. Bilateral pars defects at L5. Normal alignment of the lumbar spine. Electronically Signed   By: Dwyane Dee M.D.   On: 01/15/2017 14:33    Procedures Procedures (including critical care time)  Medications Ordered in ED Medications  sodium chloride 0.9 % bolus 1,000 mL (0 mLs Intravenous Stopped 01/15/17 1533)  ketorolac (TORADOL) 15 MG/ML injection 15 mg (15 mg Intravenous Given 01/15/17 1337)     Initial Impression / Assessment and Plan / ED Course  I have reviewed the triage vital signs and the nursing notes.  Pertinent labs & imaging results that were available during my care of the patient were reviewed by me and considered in my medical decision making (see chart for details).    31 year old female with a history of nephrolithiasis presents with concern for left flank pain, nausea, vomiting and diarrhea. Patient was seen at urgent care she had a negative pregnancy test, urinalysis showing trace  leukocytes and hemoglobin, was sent to the emergency department for further imaging for nephrolithiasis. Patient has a history of multiple prior surgeries, X-ray today showed 9 mm stone, and CT was ordered which showed a large nonobstructing left-sided nephrolithiasis. Discussed that I do not feel this is the etiology of her pain given its location. Patient with possible other musculoskeletal pain or strain.  Abdominal exam is benign, and have low suspicion for diverticulitis or appendicitis. Patient with several episodes of diarrhea and vomiting, and suspect likely viral gastroenteritis. She is given a prescription for Zofran for nausea, and Flexeril for left-sided back pain. Urine sent for culture but do not feel urinalysis is consistent with UTI. Discussed reasons to return in detail. Patient discharged in stable condition with understanding of  reasons to return.    Final Clinical Impressions(s) / ED Diagnoses   Final diagnoses:  Nausea vomiting and diarrhea  Acute left-sided low back pain without sciatica    New Prescriptions Discharge Medication List as of 01/15/2017  3:28 PM    START taking these medications   Details  cyclobenzaprine (FLEXERIL) 10 MG tablet Take 1 tablet (10 mg total) by mouth 2 (two) times daily as needed for muscle spasms., Starting Wed 01/15/2017, Print    ondansetron (ZOFRAN ODT) 4 MG disintegrating tablet Take 1 tablet (4 mg total) by mouth every 8 (eight) hours as needed for nausea or vomiting., Starting Wed 01/15/2017, Print        I personally performed the services described in this documentation, which was scribed in my presence. The recorded information has been reviewed and is accurate.     Alvira Monday, MD 01/15/17 [id number]

## 2017-01-15 NOTE — ED Provider Notes (Signed)
[facility]    CSN: [id number] Arrival date & time: 01/15/17  1028     History   Chief Complaint Chief Complaint  Patient presents with  . Flank Pain    HPI Kristin [name] is a 31 y.o. female.   left flank started today.  Nausea and "extreme exhaustion" started yesterday.  Denies burning with urination  Patient works at a daycare. She did have a brief episode of diarrhea early this morning but has not had any since. This been no blood in the stool, or urinary frequency, chills, fever, cough or sore throat.  Patient has had some mild epigastric discomfort.      Past Medical History:  Diagnosis Date  . Kidney stone   . Kidney stones    Kidney Stones  . Knee joint pain   . Pregnancy induced hypertension   . Syncope     Patient Active Problem List   Diagnosis Date Noted  . Vaginal delivery 04/15/2016  . BMI 40.0-44.9, adult (HCC) 04/14/2016  . Kidney stones--hx  03/24/2016  . History of pregnancy induced hypertension--prior pregnancy 03/24/2016  . Positive GBS test 03/24/2016  . LGA (large for gestational age) fetus--hx with 1st pregnancy, 9+6 03/01/2016  . Allergy to amoxicillin 03/01/2016  . 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 Syncope 08/21/2015  . Hypokalemia 08/21/2015  . Anxiety 08/21/2015    Past Surgical History:  Procedure Laterality Date  . KIDNEY STONE SURGERY      OB History    Gravida Para Term Preterm AB Living   0 2 2   SAB TAB Ectopic Multiple Live Births   2     0 1       Home Medications    Prior to Admission medications   Medication Sig Start Date End Date Taking? Authorizing Provider  Multiple Vitamins-Minerals (MULTIVITAMIN ADULT PO) Take 1 tablet by mouth daily.   Yes Historical Provider, MD  Albuterol Sulfate (PROVENTIL HFA IN) Inhale 2 puffs into the lungs daily as needed (sob).    Historical Provider, MD  diclofenac sodium (VOLTAREN) 1 % GEL Apply 2 g topically 4 (four)  times daily. 12/31/16   Gwyneth Sprout, MD  NIFEdipine (PROCARDIA) 10 MG capsule Take 1 capsule (10 mg total) by mouth 4 (four) times daily as needed. Patient not taking: Reported on 04/14/2016 03/01/16   Nigel Bridgeman, CNM    Family History Family History  Problem Relation Age of Onset  . CVA Mother   . Hypertension Mother   . Diabetes Mellitus II Mother   . Congestive Heart Failure Mother   . CVA Father   . Hypertension Father   . Diabetes Mellitus II Father     Social History Social History  Substance Use Topics  . Smoking status: Never Smoker  . Smokeless tobacco: Never Used  . Alcohol use No     Allergies   Amoxicillin   Review of Systems Review of Systems  Constitutional: Positive for fatigue.  Gastrointestinal: Positive for diarrhea and nausea.  Genitourinary: Positive for flank pain.  Neurological: Negative.   All other systems reviewed and are negative.    Physical Exam Triage Vital Signs ED Triage Vitals  Enc Vitals Group     BP 01/15/17 1128 128/83     Pulse Rate 01/15/17 1128 96     Resp 01/15/17 1128 18     Temp 01/15/17 1128 98.6 F (37 C)     Temp Source 01/15/17 1128  Oral     SpO2 01/15/17 1128 96 %     Weight --      Height --      Head Circumference --      Peak Flow --      Pain Score 01/15/17 1123 8     Pain Loc --      Pain Edu? --      Excl. in GC? --    No data found.   Updated Vital Signs BP 128/83 (BP Location: Right Arm) Comment (BP Location): large cuff  Pulse 96   Temp 98.6 F (37 C) (Oral)   Resp 18   LMP 01/14/2017 (Exact Date)   SpO2 96%    Physical Exam  Constitutional: She is oriented to person, place, and time. She appears well-developed and well-nourished.  HENT:  Right Ear: External ear normal.  Left Ear: External ear normal.  Mouth/Throat: Oropharynx is clear and moist.  Eyes: Conjunctivae and EOM are normal. Pupils are equal, round, and reactive to light.  Neck: Normal range of motion. Neck supple.    Cardiovascular: Normal rate, regular rhythm and normal heart sounds.   Pulmonary/Chest: Effort normal and breath sounds normal.  Abdominal: Soft. There is tenderness.  Mild tenderness in the epigastrium  Musculoskeletal: Normal range of motion.  Neurological: She is alert and oriented to person, place, and time.  Skin: Skin is warm and dry.  Nursing note and vitals reviewed.    UC Treatments / Results  Labs (all labs ordered are listed, but only abnormal results are displayed) Labs Reviewed  POCT URINALYSIS DIP (DEVICE) - Abnormal; Notable for the following:       Result Value   Hgb urine dipstick MODERATE (*)    Leukocytes, UA TRACE (*)    All other components within normal limits  POCT PREGNANCY, URINE    EKG  EKG Interpretation None       Radiology Dg Abd 1 View  Result Date: 01/15/2017 CLINICAL DATA:  Left flank pain. EXAM: ABDOMEN - 1 VIEW COMPARISON:  Ultrasound 08/26/1999 16.  CT rib 02/22/2014. FINDINGS: 9 mm calcific density lower pole left kidney. No evidence of ureteral stone. No bowel distention. No acute bony abnormality. IMPRESSION: 9 mm calcific density noted over the lower pole left kidney consistent with left nephrolithiasis. No evidence of urolithiasis. Electronically Signed   By: Maisie Fus  Register   On: 01/15/2017 12:15    Procedures Procedures (including critical care time)  Medications Ordered in UC Medications - No data to display   Initial Impression / Assessment and Plan / UC Course  I have reviewed the triage vital signs and the nursing notes.  Pertinent labs & imaging results that were available during my care of the patient were reviewed by me and considered in my medical decision making (see chart for details).     Final Clinical Impressions(s) / UC Diagnoses   Final diagnoses:  Left flank pain  Pyuria  Hematuria, microscopic    New Prescriptions Current Discharge Medication List    transferred to ED for further evaluation    Elvina Sidle, MD 01/15/17 1218

## 2017-01-15 NOTE — ED Triage Notes (Signed)
left flank started today.  Nausea and "extreme exhaustion" started yesterday.  Denies burning with urination

## 2017-01-15 NOTE — Discharge Instructions (Signed)
Your x-ray appears to show a kidney stone on the left. Therefore we are sending you down to the emergency room for further imaging and evaluation.  There also appears to be a minor urinary infection. Make sure a prescription for an antibiotic is written for you before you leave the emergency department

## 2017-01-17 ENCOUNTER — Other Ambulatory Visit: Payer: Self-pay | Admitting: Urology

## 2017-01-28 ENCOUNTER — Encounter (HOSPITAL_COMMUNITY): Payer: Self-pay | Admitting: *Deleted

## 2017-01-30 ENCOUNTER — Encounter (HOSPITAL_COMMUNITY)
Admission: RE | Disposition: A | Discharge status: Home / Self Care | Payer: Self-pay | Source: Ambulatory Visit | Attending: Urology

## 2017-01-30 ENCOUNTER — Ambulatory Visit (HOSPITAL_COMMUNITY): Payer: 59

## 2017-01-30 ENCOUNTER — Encounter (HOSPITAL_COMMUNITY): Payer: Self-pay | Admitting: General Practice

## 2017-01-30 ENCOUNTER — Ambulatory Visit (HOSPITAL_COMMUNITY)
Admission: RE | Admit: 2017-01-30 | Discharge: 2017-01-30 | Disposition: A | Discharge status: Home / Self Care | Payer: 59 | Source: Ambulatory Visit | Attending: Urology | Admitting: Urology

## 2017-01-30 DIAGNOSIS — N200 Calculus of kidney: Secondary | ICD-10-CM | POA: Insufficient documentation

## 2017-01-30 HISTORY — DX: Personal history of urinary calculi: Z87.442

## 2017-01-30 HISTORY — PX: EXTRACORPOREAL SHOCK WAVE LITHOTRIPSY: SHX1557

## 2017-01-30 LAB — PREGNANCY, URINE: PREG TEST UR: NEGATIVE

## 2017-01-30 SURGERY — LITHOTRIPSY, ESWL
Anesthesia: LOCAL | Laterality: Left

## 2017-01-30 MED ORDER — DIAZEPAM 5 MG PO TABS
10.0000 mg | ORAL_TABLET | ORAL | Status: AC
  Administered 2017-01-30: 10 mg via ORAL
  Filled 2017-01-30: qty 2

## 2017-01-30 MED ORDER — CIPROFLOXACIN HCL 500 MG PO TABS
500.0000 mg | ORAL_TABLET | ORAL | Status: AC
  Administered 2017-01-30: 500 mg via ORAL
  Filled 2017-01-30: qty 1

## 2017-01-30 MED ORDER — DIPHENHYDRAMINE HCL 25 MG PO CAPS
25.0000 mg | ORAL_CAPSULE | ORAL | Status: AC
Start: 2017-01-30 — End: 2017-01-30
  Administered 2017-01-30: 25 mg via ORAL
  Filled 2017-01-30: qty 1

## 2017-01-30 MED ORDER — SODIUM CHLORIDE 0.9 % IV SOLN
INTRAVENOUS | Status: DC
  Administered 2017-01-30: 08:00:00 via INTRAVENOUS

## 2017-01-30 NOTE — Discharge Instructions (Signed)
Lithotripsy, Care After °This sheet gives you information about how to care for yourself after your procedure. Your health care provider may also give you more specific instructions. If you have problems or questions, contact your health care provider. °What can I expect after the procedure? °After the procedure, it is common to have: °· Some blood in your urine. This should only last for a few days. °· Soreness in your back, sides, or upper abdomen for a few days. °· Blotches or bruises on your back where the pressure wave entered the skin. °· Pain, discomfort, or nausea when pieces (fragments) of the kidney stone move through the tube that carries urine from the kidney to the bladder (ureter). Stone fragments may pass soon after the procedure, but they may continue to pass for up to 4-8 weeks. °? If you have severe pain or nausea, contact your health care provider. This may be caused by a large stone that was not broken up, and this may mean that you need more treatment. °· Some pain or discomfort during urination. °· Some pain or discomfort in the lower abdomen or (in men) at the base of the penis. ° °Follow these instructions at home: °Medicines °· Take over-the-counter and prescription medicines only as told by your health care provider. °· If you were prescribed an antibiotic medicine, take it as told by your health care provider. Do not stop taking the antibiotic even if you start to feel better. °· Do not drive for 24 hours if you were given a medicine to help you relax (sedative). °· Do not drive or use heavy machinery while taking prescription pain medicine. °Eating and drinking °· Drink enough water and fluids to keep your urine clear or pale yellow. This helps any remaining pieces of the stone to pass. It can also help prevent new stones from forming. °· Eat plenty of fresh fruits and vegetables. °· Follow instructions from your health care provider about eating and drinking restrictions. You may be  instructed: °? To reduce how much salt (sodium) you eat or drink. Check ingredients and nutrition facts on packaged foods and beverages. °? To reduce how much meat you eat. °· Eat the recommended amount of calcium for your age and gender. Ask your health care provider how much calcium you should have. °General instructions °· Get plenty of rest. °· Most people can resume normal activities 1-2 days after the procedure. Ask your health care provider what activities are safe for you. °· If directed, strain all urine through the strainer that was provided by your health care provider. °? Keep all fragments for your health care provider to see. Any stones that are found may be sent to a medical lab for examination. The stone may be as small as a grain of salt. °· Keep all follow-up visits as told by your health care provider. This is important. °Contact a health care provider if: °· You have pain that is severe or does not get better with medicine. °· You have nausea that is severe or does not go away. °· You have blood in your urine longer than your health care provider told you to expect. °· You have more blood in your urine. °· You have pain during urination that does not go away. °· You urinate more frequently than usual and this does not go away. °· You develop a rash or any other possible signs of an allergic reaction. °Get help right away if: °· You have severe pain in   your back, sides, or upper abdomen. °· You have severe pain while urinating. °· Your urine is very dark red. °· You have blood in your stool (feces). °· You cannot pass any urine at all. °· You feel a strong urge to urinate after emptying your bladder. °· You have a fever or chills. °· You develop shortness of breath, difficulty breathing, or chest pain. °· You have severe nausea that leads to persistent vomiting. °· You faint. °Summary °· After this procedure, it is common to have some pain, discomfort, or nausea when pieces (fragments) of the  kidney stone move through the tube that carries urine from the kidney to the bladder (ureter). If this pain or nausea is severe, however, you should contact your health care provider. °· Most people can resume normal activities 1-2 days after the procedure. Ask your health care provider what activities are safe for you. °· Drink enough water and fluids to keep your urine clear or pale yellow. This helps any remaining pieces of the stone to pass, and it can help prevent new stones from forming. °· If directed, strain your urine and keep all fragments for your health care provider to see. Fragments or stones may be as small as a grain of salt. °· Get help right away if you have severe pain in your back, sides, or upper abdomen or have severe pain while urinating. °This information is not intended to replace advice given to you by your health care provider. Make sure you discuss any questions you have with your health care provider. °Document Released: 09/29/2007 Document Revised: 07/31/2016 Document Reviewed: 07/31/2016 °Elsevier Interactive Patient Education © 2017 [employer] ° °

## 2017-01-31 ENCOUNTER — Encounter (HOSPITAL_COMMUNITY): Payer: Self-pay | Admitting: Urology

## 2017-04-12 ENCOUNTER — Encounter (HOSPITAL_COMMUNITY): Payer: Self-pay | Admitting: Emergency Medicine

## 2017-04-12 ENCOUNTER — Emergency Department (HOSPITAL_COMMUNITY)
Admission: EM | Admit: 2017-04-12 | Discharge: 2017-04-13 | Disposition: A | Discharge status: Home / Self Care | Payer: 59 | Attending: Emergency Medicine | Admitting: Emergency Medicine

## 2017-04-12 DIAGNOSIS — R1032 Left lower quadrant pain: Secondary | ICD-10-CM | POA: Diagnosis present

## 2017-04-12 DIAGNOSIS — M5416 Radiculopathy, lumbar region: Secondary | ICD-10-CM | POA: Diagnosis not present

## 2017-04-12 DIAGNOSIS — Z79899 Other long term (current) drug therapy: Secondary | ICD-10-CM | POA: Diagnosis not present

## 2017-04-12 LAB — URINALYSIS, ROUTINE W REFLEX MICROSCOPIC
Bilirubin Urine: NEGATIVE
Glucose, UA: NEGATIVE mg/dL
Ketones, ur: NEGATIVE mg/dL
Nitrite: NEGATIVE
PROTEIN: NEGATIVE mg/dL
Specific Gravity, Urine: 1.025 (ref 1.005–1.030)
pH: 5 (ref 5.0–8.0)

## 2017-04-12 MED ORDER — KETOROLAC TROMETHAMINE 30 MG/ML IJ SOLN
30.0000 mg | Freq: Once | INTRAMUSCULAR | Status: AC
  Administered 2017-04-12: 30 mg via INTRAVENOUS
  Filled 2017-04-12: qty 1

## 2017-04-12 MED ORDER — LORAZEPAM 2 MG/ML IJ SOLN
2.0000 mg | Freq: Once | INTRAMUSCULAR | Status: AC
  Administered 2017-04-12: 2 mg via INTRAVENOUS
  Filled 2017-04-12: qty 1

## 2017-04-12 MED ORDER — PREDNISONE 20 MG PO TABS
60.0000 mg | ORAL_TABLET | Freq: Once | ORAL | Status: AC
  Administered 2017-04-12: 60 mg via ORAL
  Filled 2017-04-12: qty 3

## 2017-04-12 NOTE — ED Provider Notes (Signed)
[facility] Note   CSN: [id number] Arrival date & time: 04/12/17  2055     History   Chief Complaint Chief Complaint  Patient presents with  . Flank Pain    HPI Barnet GlasgowLauren G [name] is a 31 y.o. female.   She presents for evaluation of left flank pain for 2 months.  The pain is persistent, worse with touch, movement, and radiates to her left leg.  Sometimes she also has numbness in her left hand and left leg.  She was initially concerned that this was related to a recent lithotripsy procedure, but saw her urologist, yesterday who stated that he felt it was not related to the lithotripsy procedure, or kidney stones.  She denies fever, chills, dysuria, urinary incontinence, bowel incontinence, constipation, focal weakness or dizziness.  There are no other known modifying factors.   HPI  Past Medical History:  Diagnosis Date  . History of kidney stones   . Kidney stone   . Kidney stones    Kidney Stones  . Knee joint pain   . Pregnancy induced hypertension   . Syncope     Patient Active Problem List   Diagnosis Date Noted  . Vaginal delivery 04/15/2016  . BMI 40.0-44.9, adult (HCC) 04/14/2016  . Kidney stones--hx  03/24/2016  . History of pregnancy induced hypertension--prior pregnancy 03/24/2016  . Positive GBS test 03/24/2016  . LGA (large for gestational age) fetus--hx with 1st pregnancy, 9+6 03/01/2016  . Allergy to amoxicillin 03/01/2016  . 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 Syncope 08/21/2015  . Hypokalemia 08/21/2015  . Anxiety 08/21/2015    Past Surgical History:  Procedure Laterality Date  . EXTRACORPOREAL SHOCK WAVE LITHOTRIPSY Left 01/30/2017   Procedure: LEFT EXTRACORPOREAL SHOCK WAVE LITHOTRIPSY (ESWL);  Surgeon: Malen GauzeMcKenzie, Patrick L, MD;  Location: [facility];  Service: Urology;  Laterality: Left;  . KIDNEY STONE SURGERY      OB History    Gravida Para Term Preterm AB Living   4 2 2  0 2 2   SAB TAB Ectopic  Multiple Live Births   2     0 1       Home Medications    Prior to Admission medications   Medication Sig Start Date End Date Taking? Authorizing Provider  Albuterol Sulfate (PROVENTIL HFA IN) Inhale 2 puffs into the lungs daily as needed (sob).    [provider]  cyclobenzaprine (FLEXERIL) 10 MG tablet Take 1 tablet (10 mg total) by mouth 2 (two) times daily as needed for muscle spasms. 01/15/17   Alvira MondaySchlossman, Erin, MD  diclofenac sodium (VOLTAREN) 1 % GEL Apply 2 g topically 4 (four) times daily. 12/31/16   Gwyneth SproutPlunkett, Whitney, MD  Multiple Vitamins-Minerals (MULTIVITAMIN ADULT PO) Take 1 tablet by mouth daily.    [provider]  NIFEdipine (PROCARDIA) 10 MG capsule Take 1 capsule (10 mg total) by mouth 4 (four) times daily as needed. Patient not taking: Reported on 04/14/2016 03/01/16   Nigel BridgemanLatham, Vicki, CNM  ondansetron (ZOFRAN ODT) 4 MG disintegrating tablet Take 1 tablet (4 mg total) by mouth every 8 (eight) hours as needed for nausea or vomiting. 01/15/17   Alvira MondaySchlossman, Erin, MD  predniSONE (DELTASONE) 10 MG tablet Take 2 tablets (20 mg total) by mouth 2 (two) times daily. 04/13/17   Mancel BaleWentz, Antiono Ettinger, MD    Family History Family History  Problem Relation Age of Onset  . CVA Mother   . Hypertension Mother   . Diabetes Mellitus II Mother   .  Congestive Heart Failure Mother   . CVA Father   . Hypertension Father   . Diabetes Mellitus II Father     Social History Social History  Substance Use Topics  . Smoking status: Never Smoker  . Smokeless tobacco: Never Used  . Alcohol use No     Allergies   Amoxicillin   Review of Systems Review of Systems  All other systems reviewed and are negative.    Physical Exam Updated Vital Signs BP 140/84 (BP Location: Left Arm)   Pulse 73   Temp 98.3 F (36.8 C) (Oral)   Resp 19   Ht 5\' 5"  (1.651 m)   Wt 95.3 kg (210 lb)   SpO2 100%   BMI 34.95 kg/m   Physical Exam  Constitutional: She is oriented to person,  place, and time. She appears well-developed. No distress.  Overweight  HENT:  Head: Normocephalic and atraumatic.  Eyes: Pupils are equal, round, and reactive to light. Conjunctivae and EOM are normal.  Neck: Normal range of motion and phonation normal. Neck supple.  Cardiovascular: Normal rate and regular rhythm.   Pulmonary/Chest: Effort normal and breath sounds normal. She exhibits no tenderness.  Abdominal: Soft. She exhibits no distension. There is no tenderness. There is no guarding.  Musculoskeletal: She exhibits no tenderness or deformity.  Normal strength arms and legs bilaterally.Tender left paravertebral musculature with decreased range of motion and pain at the site, when she attempts to move.  Neurological: She is alert and oriented to person, place, and time. She exhibits normal muscle tone.  Skin: Skin is warm and dry.  Psychiatric: She has a normal mood and affect. Her behavior is normal. Judgment and thought content normal.  Nursing note and vitals reviewed.    ED Treatments / Results  Labs (all labs ordered are listed, but only abnormal results are displayed) Labs Reviewed  URINALYSIS, ROUTINE W REFLEX MICROSCOPIC - Abnormal; Notable for the following:       Result Value   APPearance HAZY (*)    Hgb urine dipstick LARGE (*)    Leukocytes, UA SMALL (*)    Bacteria, UA RARE (*)    Squamous Epithelial / LPF 0-5 (*)    All other components within normal limits    EKG  EKG Interpretation None       Radiology No results found.  Procedures Procedures (including critical care time)  Medications Ordered in ED Medications  ketorolac (TORADOL) 30 MG/ML injection 30 mg (30 mg Intravenous Given 04/12/17 2320)  predniSONE (DELTASONE) tablet 60 mg (60 mg Oral Given 04/12/17 2312)  LORazepam (ATIVAN) injection 2 mg (2 mg Intravenous Given 04/12/17 2320)     Initial Impression / Assessment and Plan / ED Course  I have reviewed the triage vital signs and the nursing  notes.  Pertinent labs & imaging results that were available during my care of the patient were reviewed by me and considered in my medical decision making (see chart for details).      Patient Vitals for the past 24 hrs:  BP Temp Temp src Pulse Resp SpO2 Height Weight  04/12/17 2320 140/84 98.3 F (36.8 C) Oral 73 19 100 % - -  04/12/17 2108 (!) 161/94 98.3 F (36.8 C) Oral 66 16 99 % 5\' 5"  (1.651 m) 95.3 kg (210 lb)    12:01 AM Reevaluation with update and discussion. After initial assessment and treatment, an updated evaluation reveals feeling better at this time.  She has no additional complaints.  Findings discussed with patient and husband, all questions answered. Sitlali Koerner L    Final Clinical Impressions(s) / ED Diagnoses   Final diagnoses:  Lumbar radiculopathy    Lumbar radiculopathy without suspected lumbar myelopathy, fracture or discitis.  Nursing Notes Reviewed/ Care Coordinated Applicable Imaging Reviewed Interpretation of Laboratory Data incorporated into ED treatment  The patient appears reasonably screened and/or stabilized for discharge and I doubt any other medical condition or other [facility] requiring further screening, evaluation, or treatment in the ED at this time prior to discharge.  Plan: Home Medications-OTC analgesia; Home Treatments-heat and gradually advance activity.  Work release for [employer] duty for 5 days; return here if the recommended treatment, does not improve the symptoms; Recommended follow up-PCP, as needed, orthopedics for follow-up evaluation and treatment\   New Prescriptions New Prescriptions   PREDNISONE (DELTASONE) 10 MG TABLET    Take 2 tablets (20 mg total) by mouth 2 (two) times daily.     Mancel Bale, MD 04/13/17 0005

## 2017-04-12 NOTE — ED Notes (Signed)
Bed: [facility] Expected date:  Expected time:  Means of arrival:  Comments: 

## 2017-04-12 NOTE — ED Triage Notes (Signed)
Pt from home with c/o left flank and back pain that has been ongoing x 2 months. Pt states she has seen her urologist for this and has been told her left kidney is enlarged. Pt states she had lithotripsy done about 3 months ago and was told she still has kidney stones. Pt rates pain 10/10 but denies urinary symptoms at current time. Pt denies any trauma to back

## 2017-04-12 NOTE — ED Provider Notes (Signed)
     Kristin [name], Kristin Pasha, MD 04/12/17 [phone]

## 2017-04-13 MED ORDER — PREDNISONE 10 MG PO TABS
20.0000 mg | ORAL_TABLET | Freq: Two times a day (BID) | ORAL | 0 refills | Status: DC
Start: 2017-04-13 — End: 2017-06-04

## 2017-04-13 NOTE — Discharge Instructions (Signed)
Use heat on the sore area 3 or 4 times a day.  Start the prednisone prescription tomorrow.  Take Tylenol every 4 hours as needed for pain.

## 2017-05-04 ENCOUNTER — Emergency Department (HOSPITAL_COMMUNITY): Payer: 59

## 2017-05-04 ENCOUNTER — Emergency Department (HOSPITAL_COMMUNITY)
Admission: EM | Admit: 2017-05-04 | Discharge: 2017-05-04 | Disposition: A | Discharge status: Home / Self Care | Payer: 59 | Attending: Emergency Medicine | Admitting: Emergency Medicine

## 2017-05-04 ENCOUNTER — Encounter (HOSPITAL_COMMUNITY): Payer: Self-pay

## 2017-05-04 DIAGNOSIS — M5416 Radiculopathy, lumbar region: Secondary | ICD-10-CM | POA: Diagnosis not present

## 2017-05-04 DIAGNOSIS — M545 Low back pain: Secondary | ICD-10-CM | POA: Diagnosis present

## 2017-05-04 DIAGNOSIS — Z880 Allergy status to penicillin: Secondary | ICD-10-CM | POA: Diagnosis not present

## 2017-05-04 DIAGNOSIS — Z79899 Other long term (current) drug therapy: Secondary | ICD-10-CM | POA: Insufficient documentation

## 2017-05-04 LAB — I-STAT BETA HCG BLOOD, ED (MC, WL, AP ONLY)

## 2017-05-04 LAB — COMPREHENSIVE METABOLIC PANEL
ALT: 15 U/L (ref 14–54)
AST: 17 U/L (ref 15–41)
Albumin: 3.5 g/dL (ref 3.5–5.0)
Alkaline Phosphatase: 57 U/L (ref 38–126)
Anion gap: 6 (ref 5–15)
BILIRUBIN TOTAL: 0.5 mg/dL (ref 0.3–1.2)
BUN: 12 mg/dL (ref 6–20)
CALCIUM: 8.9 mg/dL (ref 8.9–10.3)
CHLORIDE: 109 mmol/L (ref 101–111)
CO2: 23 mmol/L (ref 22–32)
CREATININE: 0.64 mg/dL (ref 0.44–1.00)
Glucose, Bld: 101 mg/dL — ABNORMAL HIGH (ref 65–99)
Potassium: 4 mmol/L (ref 3.5–5.1)
Sodium: 138 mmol/L (ref 135–145)
TOTAL PROTEIN: 7.2 g/dL (ref 6.5–8.1)

## 2017-05-04 LAB — URINALYSIS, ROUTINE W REFLEX MICROSCOPIC
Bilirubin Urine: NEGATIVE
Glucose, UA: NEGATIVE mg/dL
Hgb urine dipstick: NEGATIVE
Ketones, ur: NEGATIVE mg/dL
NITRITE: NEGATIVE
Protein, ur: NEGATIVE mg/dL
SPECIFIC GRAVITY, URINE: 1.021 (ref 1.005–1.030)
pH: 5 (ref 5.0–8.0)

## 2017-05-04 LAB — CBC WITH DIFFERENTIAL/PLATELET
Basophils Absolute: 0 10*3/uL (ref 0.0–0.1)
Basophils Relative: 0 %
EOS PCT: 2 %
Eosinophils Absolute: 0.2 10*3/uL (ref 0.0–0.7)
HCT: 36.6 % (ref 36.0–46.0)
Hemoglobin: 12 g/dL (ref 12.0–15.0)
Lymphocytes Relative: 28 %
Lymphs Abs: 2.7 10*3/uL (ref 0.7–4.0)
MCH: 27.6 pg (ref 26.0–34.0)
MCHC: 32.8 g/dL (ref 30.0–36.0)
MCV: 84.3 fL (ref 78.0–100.0)
MONO ABS: 0.7 10*3/uL (ref 0.1–1.0)
Monocytes Relative: 7 %
NEUTROS ABS: 6.2 10*3/uL (ref 1.7–7.7)
NEUTROS PCT: 63 %
PLATELETS: 311 10*3/uL (ref 150–400)
RBC: 4.34 MIL/uL (ref 3.87–5.11)
RDW: 13.7 % (ref 11.5–15.5)
WBC: 9.8 10*3/uL (ref 4.0–10.5)

## 2017-05-04 MED ORDER — HYDROCODONE-ACETAMINOPHEN 5-325 MG PO TABS: 1.0000 | ORAL_TABLET | Freq: Four times a day (QID) | ORAL | 0 refills | Status: DC | PRN

## 2017-05-04 MED ORDER — LORAZEPAM 2 MG/ML IJ SOLN
1.0000 mg | Freq: Once | INTRAMUSCULAR | Status: AC
  Administered 2017-05-04: 1 mg via INTRAVENOUS
  Filled 2017-05-04: qty 1

## 2017-05-04 MED ORDER — CYCLOBENZAPRINE HCL 10 MG PO TABS
10.0000 mg | ORAL_TABLET | Freq: Two times a day (BID) | ORAL | 0 refills | Status: DC | PRN
Start: 2017-05-04 — End: 2018-01-11

## 2017-05-04 MED ORDER — MORPHINE SULFATE (PF) 2 MG/ML IV SOLN
4.0000 mg | Freq: Once | INTRAVENOUS | Status: AC
  Administered 2017-05-04: 4 mg via INTRAVENOUS
  Filled 2017-05-04: qty 2

## 2017-05-04 NOTE — Discharge Instructions (Signed)
You can take Tylenol or Ibuprofen as directed for pain. Take the pain medication as needed for break through pain.   Take the muscle relaxer as directed. Do not take it at the same time as pain medication. This medication will make you drowsy so do not drive or drink alcohol when taking it.  Follow-up with the referred orthopedic doctor for further evaluation.  As we discussed, return to the emergency department for any worsening back pain, fever, urinary or bowel accidents, numbness of her groin, difficulty walking, weakness of her arms or legs, chest pain, difficulty breathing or any other worsening or concerning symptoms.

## 2017-05-04 NOTE — ED Notes (Signed)
Pt gave husband her jewerly (2 necklaces). Pt husband Camillo Flamingdrian Allen.

## 2017-05-04 NOTE — ED Provider Notes (Signed)
[facility]-EMERGENCY DEPT Provider Note   CSN: [id number] Arrival date & time: 05/04/17  1050     History   Chief Complaint Chief Complaint  Patient presents with  . Back Pain    HPI Kristin [name] is a 31 y.o. female with PMH/o kidney stones who presents with 2 months of persistent lower back pain. Patient states that pain has been ongoing for the last 2 months. Patient denies any preceding trauma, injury, fall. Patient states that she has been taking ibuprofen with minimal relief of pain. Patient states that currently, her pain is 7/10. Patient denies any worsening of symptoms or new/changes in symptoms. Patient states that she still been able to ambulate but that if bleeding does worsen her pain. Patient also reports that flexion/extension worsens her pain. No alleviating factors. Patient has some intermittent numbness/tingling to the left upper extremity and bilateral lower extremities. Patient denies any weakness of the extremities. She comes in the emergency department today because she was previously seen, she was told that she would need an MRI and could not have it done at that time. Patient states that she was told she would have to come back to the emergency department to have an MRI. Patient was seen at [facility] ED, on 721/18 for same symptoms. Patient was discharged home at that time with outpatient referral for orthopedic follow-up. Patient states that she did not follow-up. Denies fevers, weight loss, weakness of upper and lower extremities, bowel/bladder incontinence, saddle anesthesia, history of back surgery, history of IVDA.   The history is provided by the patient.    Past Medical History:  Diagnosis Date  . History of kidney stones   . Kidney stone   . Kidney stones    Kidney Stones  . Knee joint pain   . Pregnancy induced hypertension   . Syncope     Patient Active Problem List   Diagnosis Date Noted  . Vaginal delivery 04/15/2016  . BMI 40.0-44.9, adult (HCC)  04/14/2016  . Kidney stones--hx  03/24/2016  . History of pregnancy induced hypertension--prior pregnancy 03/24/2016  . Positive GBS test 03/24/2016  . LGA (large for gestational age) fetus--hx with 1st pregnancy, 9+6 03/01/2016  . Allergy to amoxicillin 03/01/2016  . 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 Syncope 08/21/2015  . Hypokalemia 08/21/2015  . Anxiety 08/21/2015    Past Surgical History:  Procedure Laterality Date  . EXTRACORPOREAL SHOCK WAVE LITHOTRIPSY Left 01/30/2017   Procedure: LEFT EXTRACORPOREAL SHOCK WAVE LITHOTRIPSY (ESWL);  Surgeon: Malen Gauze, MD;  Location: [facility] ORS;  Service: Urology;  Laterality: Left;  . KIDNEY STONE SURGERY      OB History    Gravida Para Term Preterm AB Living   4 2 2  0 2 2   SAB TAB Ectopic Multiple Live Births   2     0 1       Home Medications    Prior to Admission medications   Medication Sig Start Date End Date Taking? Authorizing Provider  Albuterol Sulfate (PROVENTIL HFA IN) Inhale 2 puffs into the lungs daily as needed (sob).   Yes [provider]  diclofenac sodium (VOLTAREN) 1 % GEL Apply 2 g topically 4 (four) times daily. 12/31/16  Yes Plunkett, Alphonzo Lemmings, MD  Multiple Vitamins-Minerals (MULTIVITAMIN ADULT PO) Take 1 tablet by mouth daily.   Yes [provider]  NIFEdipine (PROCARDIA) 10 MG capsule Take 1 capsule (10 mg total) by mouth 4 (four) times daily as needed. 03/01/16  Yes Nigel Bridgeman, CNM  ondansetron (ZOFRAN ODT) 4 MG disintegrating tablet Take 1 tablet (4 mg total) by mouth every 8 (eight) hours as needed for nausea or vomiting. 01/15/17  Yes Alvira Monday, MD  predniSONE (DELTASONE) 10 MG tablet Take 2 tablets (20 mg total) by mouth 2 (two) times daily. 04/13/17  Yes Mancel Bale, MD  cyclobenzaprine (FLEXERIL) 10 MG tablet Take 1 tablet (10 mg total) by mouth 2 (two) times daily as needed for muscle spasms. 05/04/17   Maxwell Caul, PA-C    HYDROcodone-acetaminophen (NORCO/VICODIN) 5-325 MG tablet Take 1-2 tablets by mouth every 6 (six) hours as needed. 05/04/17   Maxwell Caul, PA-C    Family History Family History  Problem Relation Age of Onset  . CVA Mother   . Hypertension Mother   . Diabetes Mellitus II Mother   . Congestive Heart Failure Mother   . CVA Father   . Hypertension Father   . Diabetes Mellitus II Father     Social History Social History  Substance Use Topics  . Smoking status: Never Smoker  . Smokeless tobacco: Never Used  . Alcohol use No     Allergies   Amoxicillin   Review of Systems Review of Systems  Constitutional: Negative for chills, diaphoresis, fever and unexpected weight change.  Respiratory: Negative for cough and shortness of breath.   Cardiovascular: Negative for chest pain.  Gastrointestinal: Negative for abdominal pain, diarrhea, nausea and vomiting.  Genitourinary: Negative for dysuria and hematuria.  Musculoskeletal: Positive for back pain. Negative for neck pain.  Neurological: Positive for numbness (Intermittent). Negative for weakness and headaches.     Physical Exam Updated Vital Signs BP (!) 160/89 (BP Location: Right Arm)   Pulse 71   Temp 98.2 F (36.8 C) (Oral)   Resp 14   LMP 04/12/2017   SpO2 96%   Physical Exam  Constitutional: She is oriented to person, place, and time. She appears well-developed and well-nourished.  Sitting comfortably on examination table  HENT:  Head: Normocephalic and atraumatic.  Mouth/Throat: Oropharynx is clear and moist and mucous membranes are normal.  Eyes: Pupils are equal, round, and reactive to light. Conjunctivae, EOM and lids are normal.  Neck: Full passive range of motion without pain.  Full flexion/extension and lateral movement of neck fully intact. No bony midline tenderness. No deformities or crepitus.   Cardiovascular: Normal rate, regular rhythm, normal heart sounds and normal pulses.  Exam reveals no  gallop and no friction rub.   No murmur heard. Pulmonary/Chest: Effort normal and breath sounds normal.  Abdominal: Soft. Normal appearance. There is no tenderness. There is no rigidity and no guarding.  Musculoskeletal: Normal range of motion.  Patient has tenderness to the midline lumbar region that extends diffusely to bilateral paraspinal muscles. No deformity or crepitus noted. No T-spine midline tenderness. No tenderness palpation to bilateral upper and lower extremities.  Neurological: She is alert and oriented to person, place, and time. GCS eye subscore is 4. GCS verbal subscore is 5. GCS motor subscore is 6.  Follows commands, Moves all extremities  5/5 strength to BUE and BLE  Sensation intact throughout all major nerve distributions Positive SLR bilaterally.  Skin: Skin is warm and dry. Capillary refill takes less than 2 seconds.  Psychiatric: She has a normal mood and affect. Her speech is normal.  Nursing note and vitals reviewed.    ED Treatments / Results  Labs (all labs ordered are listed, but only abnormal results are  displayed) Labs Reviewed  COMPREHENSIVE METABOLIC PANEL - Abnormal; Notable for the following:       Result Value   Glucose, Bld 101 (*)    All other components within normal limits  URINALYSIS, ROUTINE W REFLEX MICROSCOPIC - Abnormal; Notable for the following:    APPearance CLOUDY (*)    Leukocytes, UA LARGE (*)    Bacteria, UA RARE (*)    Squamous Epithelial / LPF 6-30 (*)    All other components within normal limits  CBC WITH DIFFERENTIAL/PLATELET  I-STAT BETA HCG BLOOD, ED ([facility], [facility], [facility] ONLY)    EKG  EKG Interpretation None       Radiology Ct Cervical Spine Wo Contrast  Result Date: 05/04/2017 CLINICAL DATA:  Neck pain for 2 months. EXAM: CT CERVICAL SPINE WITHOUT CONTRAST TECHNIQUE: Multidetector CT imaging of the cervical spine was performed without intravenous contrast. Multiplanar CT image reconstructions were also generated.  COMPARISON:  None. FINDINGS: Alignment: Normal alignment of the cervical vertebral bodies. Reversal of the normal cervical lordosis likely due to the flexed position of the head and neck. Skull base and vertebrae: No acute bony findings. No significant degenerative changes. The facets are normally aligned. Soft tissues and spinal canal: The spinal canal is generous. No canal stenosis. No abnormal prevertebral soft tissue swelling. Disc levels: No focal disc protrusions, spinal or foraminal stenosis. Upper chest: The lung apices are grossly clear. Other: No neck masses or adenopathy demonstrated. IMPRESSION: Reversal of the normal cervical lordosis likely due to the flexed position of the head and neck. Normal alignment and no acute bony findings. The spinal canal is generous. No disc protrusions, spinal or foraminal stenosis. Electronically Signed   By: Rudie MeyerP.  Gallerani M.D.   On: 05/04/2017 13:47   Ct Lumbar Spine Wo Contrast  Result Date: 05/04/2017 CLINICAL DATA:  Two month history of back pain. EXAM: CT LUMBAR SPINE WITHOUT CONTRAST TECHNIQUE: Multidetector CT imaging of the lumbar spine was performed without intravenous contrast administration. Multiplanar CT image reconstructions were also generated. COMPARISON:  CT scan 01/15/2017 FINDINGS: Segmentation: 5 lumbar type vertebral bodies. The last full intervertebral disc space is labeled L5-S1. Alignment: Normal. There are bilateral pars defects at L5 but no spondylolisthesis. Vertebrae: Normal.  No fracture or bone lesion. Paraspinal and other soft tissues: No significant findings other than left-sided renal calculi. Disc levels: No disc protrusions, spinal or foraminal stenosis. Diffuse bulging annulus noted at L5-S1 but the spinal canal is quite generous. IMPRESSION: 1. 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 2. Diffuse bulging annulus at L5-S1 but no significant neural compression. The spinal canal is generous. 3. No acute bony findings. 4.  Left renal calculi. Electronically Signed   By: Rudie MeyerP.  Gallerani M.D.   On: 05/04/2017 13:44    Procedures Procedures (including critical care time)  Medications Ordered in ED Medications  morphine 2 MG/ML injection 4 mg (4 mg Intravenous Given 05/04/17 1302)  LORazepam (ATIVAN) injection 1 mg (1 mg Intravenous Given 05/04/17 1302)     Initial Impression / Assessment and Plan / ED Course  I have reviewed the triage vital signs and the nursing notes.  Pertinent labs & imaging results that were available during my care of the patient were reviewed by me and considered in my medical decision making (see chart for details).     31 year old female who presents with 2 months of persistent back pain. Associated with some intermittent left upper extremity and bilateral lower extremity numbness. No weakness. Patient slightly ambulate. Patient  is afebrile, non-toxic appearing, sitting comfortably on examination table. Vital signs reviewed and stable. No neuro deficits noted on exam. No red flags on exam. History/physical exam are not concerning for spinal abscess or cauda equina. Consider muscle strain versus radiculopathy versus sciatica. Patient states that she came to the emergency room in today because when she was seen here on 04/12/17 she was told that she would need an MRI but that could not be done at that time. Patient states that her symptoms have not worsened or changed to just have been persistent. Discussed patient with Dr. Silverio Lay. Plan to check basic labs including CBC, BMP, UA, beta-hcg. Will also plan to obtain CT L-spine and CT C-spine for evaluation.  Records reviewed. Patient was seen in the emergency department on 04/12/17. The concern at that time was that patient was having some lumbar radiculopathy without any concern for acute infectious discitis, myelopathy, or fracture. Patient was discharged home on a course of prednisone. She was instructed to follow-up with outpatient orthopedics,  which she states that she did not do. There is no mention of needing an emergent MRI. Given lack of red flag symptoms and neuro deficits, do not think that patient needs an MRI in the department today.  Labs and imaging reviewed. CMP unremarkable CBC unremarkable. CT L-spine shows diffuse bulging annulus at L5/S1 but no significant neural compression. No acute bony findings. CT C-spine is negative for any acute abnormalities. Discussed results with patient. Patient reports improvement in pain after medications. Will plan to treat symptomatically. Plan to provide additional outpatient orthopedic referral. Patient instructed follow-up with orthopedic asrecommended. Conservative therapies discuss. Strict return precautions discussed. Patient expresses understanding and agreement to plan.  Final Clinical Impressions(s) / ED Diagnoses   Final diagnoses:  Lumbar radiculopathy  Acute bilateral low back pain, with sciatica presence unspecified    New Prescriptions Discharge Medication List as of 05/04/2017  2:23 PM    START taking these medications   Details  HYDROcodone-acetaminophen (NORCO/VICODIN) 5-325 MG tablet Take 1-2 tablets by mouth every 6 (six) hours as needed., Starting Sun 05/04/2017, Print         Maxwell Caul, PA-C 05/04/17 1806    Charlynne Pander, MD 05/08/17 [phone]

## 2017-05-04 NOTE — ED Notes (Signed)
Bed: WA21 Expected date:  Expected time:  Means of arrival:  Comments: 

## 2017-05-04 NOTE — ED Triage Notes (Signed)
Patient presents with c/o back pain that has been ongoing on for about 2 months.pt rating her pain at 7/10.

## 2017-06-03 NOTE — Progress Notes (Signed)
[name] [address] [place], [place] [address] Phone: [phone] Subjective:    I'm seeing this patient by the request  of:    CC:   [id number]  Kristin [name] is a 31 y.o. female coming in with complaint of back pain. Left foot swells, laying down is uncomfortable. Has tried heat but does not help.  Onset- 3 months Location- Lower back Character- sharp pain with tingling in both feet Aggravating factors-Lifting seems to be hurting her like she does not work Reliving factors- Patient states that the icing is only been helping some. Therapies tried- has been given pain medicines previously that has been helpful Severity- 10 at its worse; 7 today   patient did have a CT scan of the lumbar spine taken 05/04/2017 this isn't apparently visualized by me. 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  Past Medical History:  Diagnosis Date  . History of kidney stones   . Kidney stone   . Kidney stones    Kidney Stones  . Knee joint pain   . Pregnancy induced hypertension   . Syncope    Past Surgical History:  Procedure Laterality Date  . EXTRACORPOREAL SHOCK WAVE LITHOTRIPSY Left 01/30/2017   Procedure: LEFT EXTRACORPOREAL SHOCK WAVE LITHOTRIPSY (ESWL);  Surgeon: Malen Gauze, MD;  Location: [facility] OR[facility];  Service: Urology;  Laterality: Left;  . KIDNEY STONE SURGERY     Social History   Social History  . Marital status: Married    Spouse name: N/A  . Number of children: N/A  . Years of education: N/A   Social History Main Topics  . Smoking status: Never Smoker  . Smokeless tobacco: Never Used  . Alcohol use No  . Drug use: No  . Sexual activity: Not Asked   Other Topics Concern  . None   Social History Narrative  . None   Allergies  Allergen Reactions  . Amoxicillin Hives    Has patient had a PCN reaction causing immediate rash, facial/tongue/throat swelling,  SOB or lightheadedness with hypotension: no Has patient had a PCN reaction causing severe rash involving mucus membranes or skin necrosis: No Has patient had a PCN reaction that required hospitalization No Has patient had a PCN reaction occurring within the last 10 years: No If all of the above answers are "NO", then may proceed with Cephalosporin use.   Family History  Problem Relation Age of Onset  . CVA Mother   . Hypertension Mother   . Diabetes Mellitus II Mother   . Congestive Heart Failure Mother   . CVA Father   . Hypertension Father   . Diabetes Mellitus II Father      Past medical history, social, surgical and family history all reviewed in electronic medical record.  No pertanent information unless stated regarding to the chief complaint.   Review of Systems:Review of systems updated and as accurate as of 06/04/17  No headache, visual changes, nausea, vomiting, diarrhea, constipation, dizziness, abdominal pain, skin rash, fevers, chills, night sweats, weight loss, swollen lymph nodes, body aches, joint swelling, chest pain, shortness of breath, mood changes. Positive muscle aches  Objective  Blood pressure 140/90, pulse 67, height  (1.651 m), weight 236 lb (107 kg), SpO2 98 %, currently breastfeeding.  Systems examined below as of 06/04/17   General: No apparent distress alert and oriented x3 mood and affect normal, dressed appropriately. Obese  HEENT: Pupils equal, extraocular movements intact  Respiratory: Patient's speak in full sentences and does not appear short of breath  Cardiovascular: No lower extremity edema, non tender, no erythema  Skin: Warm dry intact with no signs of infection or rash on extremities or on axial skeleton.  Abdomen: Soft nontender  Neuro: Cranial nerves II through XII are intact, neurovascularly intact in all extremities with 2+ DTRs and 2+ pulses.  Lymph: No lymphadenopathy of posterior or anterior cervical chain or axillae bilaterally.   Gait normal with good balance and coordination.  MSK:  Non tender with full range of motion and good stability and symmetric strength and tone of shoulders, elbows, wrist, hip, knee and ankles bilaterally.   Back Exam:  Inspection: Severely poor core strength Motion: Flexion 35 deg, Extension 25 deg, Side Bending to 25 deg bilaterally,  Rotation to 35 deg bilaterally  SLR laying: Negative  XSLR laying: Negative  Palpable tenderness: Tender to palpation and appears palmar musculature of the lumbar spine left couldn't. FABER: Tightness bilaterally. Sensory change: Gross sensation intact to all lumbar and sacral dermatomes.  Reflexes: 2+ at both patellar tendons, 2+ at achilles tendons, Babinski's downgoing.  Strength at foot  Plantar-flexion: 5/5 Dorsi-flexion: 5/5 Eversion: 5/5 Inversion: 5/5  Leg strength  Quad: 5/5 Hamstring: 5/5 Hip flexor: 5/5 Hip abductors: 4/5   Gait unremarkable.  Procedure note 97110; 15 additional minutes spent for Therapeutic exercises as stated in above notes.  This included exercises focusing on stretching, strengthening, with significant focus on eccentric aspects.   Long term goals include an improvement in range of motion, strength, endurance as well as avoiding reinjury. Patient's frequency would include in 1-2 times a day, 3-5 times a week for a duration of 6-12 weeks. Low back exercises that included:  Pelvic tilt/bracing instruction to focus on control of the pelvic girdle and lower abdominal muscles  Glute strengthening exercises, focusing on proper firing of the glutes without engaging the low back muscles Proper stretching techniques for maximum relief for the hamstrings, hip flexors, low back and some rotation where tolerated  Proper technique shown and discussed handout in great detail with ATC.  All questions were discussed and answered. '    Impression and Recommendations:     This case required medical decision making of moderate  complexity.      Note: This dictation was prepared with Dragon dictation along with smaller phrase technology. Any transcriptional errors that result from this process are unintentional.

## 2017-06-04 ENCOUNTER — Ambulatory Visit (INDEPENDENT_AMBULATORY_CARE_PROVIDER_SITE_OTHER): Payer: 59 | Admitting: Family Medicine

## 2017-06-04 ENCOUNTER — Encounter: Payer: Self-pay | Admitting: Family Medicine

## 2017-06-04 ENCOUNTER — Encounter: Payer: Self-pay | Admitting: *Deleted

## 2017-06-04 DIAGNOSIS — M4306 Spondylolysis, lumbar region: Secondary | ICD-10-CM

## 2017-06-04 MED ORDER — GABAPENTIN 100 MG PO CAPS: 200.0000 mg | ORAL_CAPSULE | Freq: Every day | ORAL | 3 refills | Status: DC

## 2017-06-04 MED ORDER — VITAMIN D (ERGOCALCIFEROL) 1.25 MG (50000 UNIT) PO CAPS: 50000.0000 [IU] | ORAL_CAPSULE | ORAL | 0 refills | Status: DC

## 2017-06-04 NOTE — Assessment & Plan Note (Signed)
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 Started on low dose gabapentin at night, muscle relaxer for breakthrough pain, once weekly vitamin D for muscle strength and endurance. Patient given home exercises and hopefully we'll be beneficial. Patient will follow-up again in 2 weeks. At that time we'll consider starting osteopathic manipulation of formal physical therapy.

## 2017-06-04 NOTE — Patient Instructions (Signed)
Good to see you.  Ice 20 minutes 2 times daily. Usually after activity and before bed. Exercises 3 times a week.  Gabapentin  at night Once weekly vitamin D for 12 weeks.   of vitamin C with meat meals.  Turmeric  twice daily  Tart cherry extract  at night See me again in 2-3 weeks.

## 2017-06-14 DIAGNOSIS — R4182 Altered mental status, unspecified: Secondary | ICD-10-CM | POA: Insufficient documentation

## 2017-06-14 DIAGNOSIS — F410 Panic disorder [episodic paroxysmal anxiety] without agoraphobia: Secondary | ICD-10-CM | POA: Insufficient documentation

## 2017-06-14 DIAGNOSIS — F419 Anxiety disorder, unspecified: Secondary | ICD-10-CM | POA: Diagnosis present

## 2017-06-14 DIAGNOSIS — Z79899 Other long term (current) drug therapy: Secondary | ICD-10-CM | POA: Diagnosis not present

## 2017-06-14 DIAGNOSIS — Z046 Encounter for general psychiatric examination, requested by authority: Secondary | ICD-10-CM | POA: Diagnosis not present

## 2017-06-14 NOTE — ED Triage Notes (Signed)
Per EMS, patient from home, c/o panic attack and anxiety. Reports her mother died a couple of days ago. Patient unwilling to speak to EMS but writes down responses to questions.  BP 150/100  HR 76 O2 100% RR 18

## 2017-06-15 ENCOUNTER — Emergency Department (HOSPITAL_COMMUNITY): Payer: 59

## 2017-06-15 ENCOUNTER — Emergency Department (HOSPITAL_COMMUNITY)
Admission: EM | Admit: 2017-06-15 | Discharge: 2017-06-15 | Disposition: A | Discharge status: Home / Self Care | Payer: 59 | Attending: Emergency Medicine | Admitting: Emergency Medicine

## 2017-06-15 DIAGNOSIS — F410 Panic disorder [episodic paroxysmal anxiety] without agoraphobia: Secondary | ICD-10-CM

## 2017-06-15 LAB — RAPID URINE DRUG SCREEN, HOSP PERFORMED
AMPHETAMINES: NOT DETECTED
BARBITURATES: NOT DETECTED
BENZODIAZEPINES: POSITIVE — AB
Cocaine: NOT DETECTED
Opiates: NOT DETECTED
Tetrahydrocannabinol: NOT DETECTED

## 2017-06-15 LAB — COMPREHENSIVE METABOLIC PANEL
ALBUMIN: 3.8 g/dL (ref 3.5–5.0)
ALT: 15 U/L (ref 14–54)
AST: 17 U/L (ref 15–41)
Alkaline Phosphatase: 66 U/L (ref 38–126)
Anion gap: 8 (ref 5–15)
BUN: 12 mg/dL (ref 6–20)
CHLORIDE: 106 mmol/L (ref 101–111)
CO2: 24 mmol/L (ref 22–32)
Calcium: 9 mg/dL (ref 8.9–10.3)
Creatinine, Ser: 0.67 mg/dL (ref 0.44–1.00)
GFR calc Af Amer: >60 mL/min (ref >60)
GFR calc non Af Amer: >60 mL/min (ref >60)
GLUCOSE: 99 mg/dL (ref 65–99)
POTASSIUM: 3.8 mmol/L (ref 3.5–5.1)
SODIUM: 138 mmol/L (ref 135–145)
Total Bilirubin: 0.3 mg/dL (ref 0.3–1.2)
Total Protein: 7.5 g/dL (ref 6.5–8.1)

## 2017-06-15 LAB — CBC WITH DIFFERENTIAL/PLATELET
Basophils Absolute: 0 10*3/uL (ref 0.0–0.1)
Basophils Relative: 0 %
EOS ABS: 0.3 10*3/uL (ref 0.0–0.7)
EOS PCT: 2 %
HCT: 37.5 % (ref 36.0–46.0)
Hemoglobin: 12.3 g/dL (ref 12.0–15.0)
LYMPHS ABS: 3.7 10*3/uL (ref 0.7–4.0)
Lymphocytes Relative: 29 %
MCH: 27.8 pg (ref 26.0–34.0)
MCHC: 32.8 g/dL (ref 30.0–36.0)
MCV: 84.8 fL (ref 78.0–100.0)
Monocytes Absolute: 0.7 10*3/uL (ref 0.1–1.0)
Monocytes Relative: 5 %
Neutro Abs: 8.1 10*3/uL — ABNORMAL HIGH (ref 1.7–7.7)
Neutrophils Relative %: 64 %
PLATELETS: 286 10*3/uL (ref 150–400)
RBC: 4.42 MIL/uL (ref 3.87–5.11)
RDW: 13.5 % (ref 11.5–15.5)
WBC: 12.7 10*3/uL — AB (ref 4.0–10.5)

## 2017-06-15 LAB — ACETAMINOPHEN LEVEL

## 2017-06-15 LAB — ETHANOL

## 2017-06-15 LAB — SALICYLATE LEVEL: Salicylate Lvl: <7 mg/dL (ref 2.8–30.0)

## 2017-06-15 LAB — POC URINE PREG, ED: Preg Test, Ur: NEGATIVE

## 2017-06-15 NOTE — BH Assessment (Addendum)
Assessment Note  Kristin [name] is an 31 y.o. female who presents to the ED voluntarily accompanied by her husband whom she provided verbal consent for him to be present during the assessment. Pt reports increased anxiety due to work stress and recent loss. Pt states her mother and grandmother both died in Feb 19, 2016 around this same time. Pt reports the anniversary of their passing is coming up and also her mother's birthday is coming up. Pt reports she has been depressed since they passed away and this time of year is hard due to the reminder of the loss. Pt reports she experienced a panic attack on 06/13/17 for the first time and she was able to coach herself to it. Pt reports she experienced another panic attack today that was so debilitating she could not speak. Pt reports she feels the stress from work and dealing with the loss led to her panic attacks.   Pt reports she has trouble sleeping therefore she takes medication to assist. Pt reports she often feels fatigued and experiences loss of appetite. Pt's husband reports he sometimes has to remind her to eat. Pt denies a current OPT provider but reports she would like to receive resources for OPT therapy. Pt denies SI, HI or AVH. Pt is able to contract for safety and states she has never experienced SI and does not present to be a danger to herself. Pt provided with OPT resources at the request of the EDP and given suicidal prevention information.   Per Nira Conn, NP pt is recommended for d/c with OPT resources. Pt provided with Mental Health Resources to follow up with in regards to establishing OPT treatment. EDP Palumbo, April, MD in agreement with disposition and plan of care.   Diagnosis: MDD, single episode, w/o psychosis; GAD  Past Medical History:  Past Medical History:  Diagnosis Date  . History of kidney stones   . Kidney stone   . Kidney stones    Kidney Stones  . Knee joint pain   . Pregnancy induced hypertension   . Syncope      Past Surgical History:  Procedure Laterality Date  . EXTRACORPOREAL SHOCK WAVE LITHOTRIPSY Left 01/30/2017   Procedure: LEFT EXTRACORPOREAL SHOCK WAVE LITHOTRIPSY (ESWL);  Surgeon: Malen Gauze, MD;  Location: [facility];  Service: Urology;  Laterality: Left;  . KIDNEY STONE SURGERY      Family History:  Family History  Problem Relation Age of Onset  . CVA Mother   . Hypertension Mother   . Diabetes Mellitus II Mother   . Congestive Heart Failure Mother   . CVA Father   . Hypertension Father   . Diabetes Mellitus II Father     Social History:  reports that she has never smoked. She has never used smokeless tobacco. She reports that she does not drink alcohol or use drugs.  Additional Social History:  Alcohol / Drug Use Pain Medications: See MAR Prescriptions: See MAR Over the Counter: See MAR History of alcohol / drug use?: No history of alcohol / drug abuse  CIWA: CIWA-Ar BP: (!) 143/86 Pulse Rate: 72 COWS:    Allergies:  Allergies  Allergen Reactions  . Amoxicillin Hives    Has patient had a PCN reaction causing immediate rash, facial/tongue/throat swelling, SOB or lightheadedness with hypotension: no Has patient had a PCN reaction causing severe rash involving mucus membranes or skin necrosis: No Has patient had a PCN reaction that required hospitalization No Has patient had a PCN reaction occurring  within the last 10 years: No If all of the above answers are "NO", then may proceed with Cephalosporin use.    Home Medications:  (Not in a hospital admission)  OB/GYN Status:  Patient's last menstrual period was 05/19/2017 (exact date).  General Assessment Data Location of Assessment: [facility] TTS Assessment: In system Is this a Tele or Face-to-Face Assessment?: Face-to-Face Is this an Initial Assessment or a Re-assessment for this encounter?: Initial Assessment Marital status: Married Is patient pregnant?: No Pregnancy Status: No Living Arrangements:  Spouse/significant other, Children Can pt return to current living arrangement?: Yes Admission Status: Voluntary Is patient capable of signing voluntary admission?: Yes Referral Source: Self/Family/Friend Insurance type: Community education officer     Crisis Care Plan Living Arrangements: Spouse/significant other, Children Name of Psychiatrist: none  Name of Therapist: none  Education Status Is patient currently in school?: No Current Grade: Bachelor's Degree Highest grade of school patient has completed: college   Risk to self with the past 6 months Suicidal Ideation: No Has patient been a risk to self within the past 6 months prior to admission? : No Suicidal Intent: No Has patient had any suicidal intent within the past 6 months prior to admission? : No Is patient at risk for suicide?: No Suicidal Plan?: No Has patient had any suicidal plan within the past 6 months prior to admission? : No Access to Means: No What has been your use of drugs/alcohol within the last 12 months?: denies  Previous Attempts/Gestures: No Triggers for Past Attempts: None known Intentional Self Injurious Behavior: None Family Suicide History: No Recent stressful life event(s): Other (Comment), Loss (Comment) (mother, grandmother passed away, work stress) Persecutory voices/beliefs?: No Depression: Yes Depression Symptoms: Isolating, Fatigue, Insomnia, Loss of interest in usual pleasures, Feeling worthless/self pity Substance abuse history and/or treatment for substance abuse?: No Suicide prevention information given to non-admitted patients: Yes  Risk to Others within the past 6 months Homicidal Ideation: No Does patient have any lifetime risk of violence toward others beyond the six months prior to admission? : No Thoughts of Harm to Others: No Current Homicidal Intent: No Current Homicidal Plan: No Access to Homicidal Means: No History of harm to others?: No Assessment of Violence: None Noted Does patient  have access to weapons?: No Criminal Charges Pending?: No Does patient have a court date: No Is patient on probation?: No  Psychosis Hallucinations: None noted Delusions: None noted  Mental Status Report Appearance/Hygiene: Unremarkable Eye Contact: Good Motor Activity: Freedom of movement Speech: Logical/coherent Level of Consciousness: Alert Mood: Depressed, Anxious, Sad Affect: Anxious, Depressed Anxiety Level: Panic Attacks Panic attack frequency: 2x in the past week Most recent panic attack: 06/14/17 Thought Processes: Coherent, Relevant Judgement: Unimpaired Orientation: Person, Time, Place, Situation, Appropriate for developmental age Obsessive Compulsive Thoughts/Behaviors: None  Cognitive Functioning Concentration: Normal Memory: Remote Intact, Recent Intact IQ: Average Insight: Good Impulse Control: Good Appetite: Fair Sleep: Decreased Total Hours of Sleep: 3 (when not taking medication ) Vegetative Symptoms: None  ADLScreening [facility] Assessment Services) Patient's cognitive ability adequate to safely complete daily activities?: Yes Patient able to express need for assistance with ADLs?: Yes Independently performs ADLs?: Yes (appropriate for developmental age)  Prior Inpatient Therapy Prior Inpatient Therapy: No  Prior Outpatient Therapy Prior Outpatient Therapy: Yes Prior Therapy Dates: 2017 Prior Therapy Facilty/Provider(s): Hospice Grief Counseling  Reason for Treatment: Grief Counseling Does patient have an ACCT team?: No Does patient have Intensive In-House Services?  : No Does patient have [facility] services? : No Does  patient have P4CC services?: No  ADL Screening (condition at time of admission) Patient's cognitive ability adequate to safely complete daily activities?: Yes Is the patient deaf or have difficulty hearing?: No Does the patient have difficulty seeing, even when wearing glasses/contacts?: No Does the patient have difficulty  concentrating, remembering, or making decisions?: No Patient able to express need for assistance with ADLs?: Yes Does the patient have difficulty dressing or bathing?: No Independently performs ADLs?: Yes (appropriate for developmental age) Does the patient have difficulty walking or climbing stairs?: No Weakness of Legs: None Weakness of Arms/Hands: None  Home Assistive Devices/Equipment Home Assistive Devices/Equipment: Eyeglasses    Abuse/Neglect Assessment (Assessment to be complete while patient is alone) Physical Abuse: Denies Verbal Abuse: Denies Sexual Abuse: Denies Exploitation of patient/patient's resources: Denies Self-Neglect: Denies     Merchant navy officer (For Healthcare) Does Patient Have a Medical Advance Directive?: No Would patient like information on creating a medical advance directive?: No - Patient declined    Additional Information 1:1 In Past 12 Months?: No CIRT Risk: No Elopement Risk: No Does patient have medical clearance?: Yes     Disposition:  Disposition Initial Assessment Completed for this Encounter: Yes Disposition of Patient: Other dispositions Other disposition(s): Other (Comment) (d/c with OPT resources per Nira Conn, NP)  On Site Evaluation by:   Reviewed with Physician:    Karolee Ohs 06/15/2017 5:12 AM

## 2017-06-15 NOTE — BH Assessment (Signed)
[facility] Assessment Progress Note   Per Nira Conn, NP pt is recommended for d/c with OPT resources. Pt provided with Mental Health Resources to follow up with in regards to establishing OPT treatment. EDP Palumbo, April, MD in agreement with disposition and plan of care.   Princess Bruins, MSW, LCSWA TTS Specialist [phone]

## 2017-06-15 NOTE — ED Triage Notes (Signed)
Pt with difficulty expressing self.  Spouse stated "she's been having panic attacks, started gabapentin last Wednesday for pain."

## 2017-06-15 NOTE — ED Provider Notes (Signed)
[facility]ENC[facility]PT Provider Note   CSN: [id number] Arrival date & time: 06/14/17  2130     History   Chief Complaint Chief Complaint  Patient presents with  . Memory Loss  . Anxiety    HPI Kristin [name] is a 31 y.o. female.  The history is provided by the patient and the spouse.  Mental Health Problem  Presenting symptoms: no aggressive behavior, no hallucinations, no suicidal threats and no suicide attempt   Patient accompanied by:  Family member Degree of incapacity (severity):  Moderate Onset quality:  Gradual Timing:  Constant Progression:  Waxing and waning Chronicity:  New Context: not alcohol use   Treatment compliance:  All of the time Relieved by:  Nothing Worsened by:  Nothing Ineffective treatments:  None tried Associated symptoms: anxiety   Associated symptoms comment:  Not speaking Risk factors: no hx of suicide attempts     Past Medical History:  Diagnosis Date  . History of kidney stones   . Kidney stone   . Kidney stones    Kidney Stones  . Knee joint pain   . Pregnancy induced hypertension   . Syncope     Patient Active Problem List   Diagnosis Date Noted  . 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 Vaginal delivery 04/15/2016  . BMI 40.0-44.9, adult (HCC) 04/14/2016  . Kidney stones--hx  03/24/2016  . History of pregnancy induced hypertension--prior pregnancy 03/24/2016  . Positive GBS test 03/24/2016  . LGA (large for gestational age) fetus--hx with 1st pregnancy, 9+6 03/01/2016  . Allergy to amoxicillin 03/01/2016  . 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 Syncope 08/21/2015  . Hypokalemia 08/21/2015  . Anxiety 08/21/2015    Past Surgical History:  Procedure Laterality Date  . EXTRACORPOREAL SHOCK WAVE LITHOTRIPSY Left 01/30/2017   Procedure: LEFT EXTRACORPOREAL SHOCK WAVE LITHOTRIPSY (ESWL);  Surgeon: Malen Gauze, MD;  Location: [facility];  Service: Urology;  Laterality: Left;  . KIDNEY STONE  SURGERY      OB History    Gravida Para Term Preterm AB Living   0 2 2   SAB TAB Ectopic Multiple Live Births   2     0 1       Home Medications    Prior to Admission medications   Medication Sig Start Date End Date Taking? Authorizing Provider  Ascorbic Acid (VITAMIN C) 500 MG CAPS Take 500 mg by mouth daily.   Yes [provider]  gabapentin (NEURONTIN) 100 MG capsule Take 2 capsules (200 mg total) by mouth at bedtime. 06/04/17  Yes [name], DO  Misc Natural Products (TART CHERRY ADVANCED PO) Take 3 tablets by mouth daily.   Yes [provider]  Turmeric 500 MG CAPS Take 500 mg by mouth daily.   Yes [provider]  Vitamin D, Ergocalciferol, (DRISDOL) 50000 units CAPS capsule Take 1 capsule (50,000 Units total) by mouth every 7 (seven) days. 06/04/17  Yes [name], DO  cyclobenzaprine (FLEXERIL) 10 MG tablet Take 1 tablet (10 mg total) by mouth 2 (two) times daily as needed for muscle spasms. Patient not taking: Reported on 06/15/2017 05/04/17   Graciella Freer A, PA-C  diclofenac sodium (VOLTAREN) 1 % GEL Apply 2 g topically 4 (four) times daily. Patient not taking: Reported on 06/15/2017 12/31/16   Gwyneth Sprout, MD  HYDROcodone-acetaminophen (NORCO/VICODIN) 5-325 MG tablet Take 1-2 tablets by mouth every 6 (six) hours as needed. Patient not taking: Reported  on 06/15/2017 05/04/17   Maxwell Caul, PA-C  Multiple Vitamins-Minerals (MULTIVITAMIN ADULT PO) Take 1 tablet by mouth daily.    [provider]  ondansetron (ZOFRAN ODT) 4 MG disintegrating tablet Take 1 tablet (4 mg total) by mouth every 8 (eight) hours as needed for nausea or vomiting. Patient not taking: Reported on 06/15/2017 01/15/17   Alvira Monday, MD    Family History Family History  Problem Relation Age of Onset  . CVA Mother   . Hypertension Mother   . Diabetes Mellitus II Mother   . Congestive Heart Failure Mother   . CVA Father   . Hypertension  Father   . Diabetes Mellitus II Father     Social History Social History  Substance Use Topics  . Smoking status: Never Smoker  . Smokeless tobacco: Never Used  . Alcohol use No     Allergies   Amoxicillin   Review of Systems Review of Systems  Psychiatric/Behavioral: Negative for hallucinations. The patient is nervous/anxious.   All other systems reviewed and are negative.    Physical Exam Updated Vital Signs BP (!) 143/86 (BP Location: Left Arm)   Pulse 72   Temp 98.2 F (36.8 C) (Oral)   Resp 18   Wt 107 kg (236 lb)   LMP 05/19/2017 (Exact Date)   SpO2 100%   BMI 39.27 kg/m   Physical Exam  Constitutional: She is oriented to person, place, and time. She appears well-developed and well-nourished. No distress.  HENT:  Head: Normocephalic and atraumatic.  Right Ear: External ear normal.  Left Ear: External ear normal.  Nose: Nose normal.  Mouth/Throat: Oropharynx is clear and moist. No oropharyngeal exudate.  Eyes: Pupils are equal, round, and reactive to light. Conjunctivae are normal.  Neck: Normal range of motion. Neck supple.  Cardiovascular: Normal rate, regular rhythm, normal heart sounds and intact distal pulses.   Pulmonary/Chest: Effort normal and breath sounds normal. She has no wheezes. She has no rales.  Abdominal: Soft. Bowel sounds are normal. She exhibits no mass. There is no tenderness. There is no rebound and no guarding.  Musculoskeletal: Normal range of motion.  Neurological: She is alert and oriented to person, place, and time.  Skin: Skin is warm and dry. Capillary refill takes less than 2 seconds.  Psychiatric: She has a normal mood and affect.     ED Treatments / Results   Vitals:   06/14/17 2230 06/15/17 0019  BP: (!) 145/86 (!) 143/86  Pulse: 77 72  Resp: 18 18  Temp: 98.3 F (36.8 C) 98.2 F (36.8 C)  SpO2: 100% 100%    Labs (all labs ordered are listed, but only abnormal results are displayed)  Results for orders  placed or performed during the hospital encounter of 06/15/17  Comprehensive metabolic panel  Result Value Ref Range   Sodium 138 135 - 145 mmol/L   Potassium 3.8 3.5 - 5.1 mmol/L   Chloride 106 101 - 111 mmol/L   CO2 24 22 - 32 mmol/L   Glucose, Bld 99 65 - 99 mg/dL   BUN 12 6 - 20 mg/dL   Creatinine, Ser 1.19 0.44 - 1.00 mg/dL   Calcium 9.0 8.9 - 14.7 mg/dL   Total Protein 7.5 6.5 - 8.1 g/dL   Albumin 3.8 3.5 - 5.0 g/dL   AST 17 15 - 41 U/L   ALT 15 14 - 54 U/L   Alkaline Phosphatase 66 38 - 126 U/L   Total Bilirubin 0.3 0.3 -  1.2 mg/dL   GFR calc non Af Amer >60 >60 mL/min   GFR calc Af Amer >60 >60 mL/min   Anion gap 8 5 - 15  Ethanol  Result Value Ref Range   Alcohol, Ethyl (B) <5 <5 mg/dL  Urine rapid drug screen (hosp performed)  Result Value Ref Range   Opiates NONE DETECTED NONE DETECTED   Cocaine NONE DETECTED NONE DETECTED   Benzodiazepines POSITIVE (A) NONE DETECTED   Amphetamines NONE DETECTED NONE DETECTED   Tetrahydrocannabinol NONE DETECTED NONE DETECTED   Barbiturates NONE DETECTED NONE DETECTED  CBC with Diff  Result Value Ref Range   WBC 12.7 (H) 4.0 - 10.5 K/uL   RBC 4.42 3.87 - 5.11 MIL/uL   Hemoglobin 12.3 12.0 - 15.0 g/dL   HCT 16.1 09.6 - 04.5 %   MCV 84.8 78.0 - 100.0 fL   MCH 27.8 26.0 - 34.0 pg   MCHC 32.8 30.0 - 36.0 g/dL   RDW 40.9 81.1 - 91.4 %   Platelets 286 150 - 400 K/uL   Neutrophils Relative % 64 %   Neutro Abs 8.1 (H) 1.7 - 7.7 K/uL   Lymphocytes Relative 29 %   Lymphs Abs 3.7 0.7 - 4.0 K/uL   Monocytes Relative 5 %   Monocytes Absolute 0.7 0.1 - 1.0 K/uL   Eosinophils Relative 2 %   Eosinophils Absolute 0.3 0.0 - 0.7 K/uL   Basophils Relative 0 %   Basophils Absolute 0.0 0.0 - 0.1 K/uL  Acetaminophen level  Result Value Ref Range   Acetaminophen (Tylenol), Serum <10 (L) 10 - 30 ug/mL  Salicylate level  Result Value Ref Range   Salicylate Lvl <7.0 2.8 - 30.0 mg/dL  POC urine preg, ED  Result Value Ref Range   Preg Test,  Ur NEGATIVE NEGATIVE   Ct Head Wo Contrast  Result Date: 06/15/2017 CLINICAL DATA:  Altered mental status increased panic attacks EXAM: CT HEAD WITHOUT CONTRAST TECHNIQUE: Contiguous axial images were obtained from the base of the skull through the vertex without intravenous contrast. COMPARISON:  08/20/2015, 11/02/2003 FINDINGS: Brain: No evidence of acute infarction, hemorrhage, hydrocephalus, extra-axial collection or mass lesion/mass effect. Vascular: No hyperdense vessel or unexpected calcification. Skull: Normal. Negative for fracture or focal lesion. Sinuses/Orbits: Mucous retention cyst and mucosal thickening in the right maxillary sinus. No acute orbital abnormality. Other: None IMPRESSION: No CT evidence for acute intracranial abnormality Electronically Signed   By: Jasmine Pang M.D.   On: 06/15/2017 01:59     Procedures Procedures (including critical care time)    Final Clinical Impressions(s) / ED Diagnoses  Medically clear for psychiatry      Darriel Sinquefield, MD 06/15/17 [phone]

## 2017-06-15 NOTE — ED Notes (Signed)
Bed: [facility] Expected date:  Expected time:  Means of arrival:  Comments: 

## 2017-06-25 ENCOUNTER — Ambulatory Visit (INDEPENDENT_AMBULATORY_CARE_PROVIDER_SITE_OTHER): Payer: 59 | Admitting: Family Medicine

## 2017-06-25 ENCOUNTER — Encounter: Payer: Self-pay | Admitting: Family Medicine

## 2017-06-25 VITALS — BP 130/80 | HR 76 | Ht 65.0 in | Wt 235.0 lb

## 2017-06-25 DIAGNOSIS — M549 Dorsalgia, unspecified: Secondary | ICD-10-CM

## 2017-06-25 DIAGNOSIS — M4306 Spondylolysis, lumbar region: Secondary | ICD-10-CM | POA: Diagnosis not present

## 2017-06-25 NOTE — Progress Notes (Signed)
[name] [address] [place], [place] [address] Phone: [phone] Subjective:     CC: low back pain   [id number]  Kristin [name] is a 31 y.o. 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 Patient is having chronic neck pain. Was to do home exercises, gabapentin which unfortunately gave her some cognitive difficulties, and continues to have radiation down the legs. States that she continues to have pain even with her having some limited activity at work. Patient is still having difficulty taking care of herself and is having trouble with the recent passing of her mother. Patient states that the pain is somewhat unrelenting. Affecting daily activities.      Past Medical History:  Diagnosis Date  . History of kidney stones   . Kidney stone   . Kidney stones    Kidney Stones  . Knee joint pain   . Pregnancy induced hypertension   . Syncope    Past Surgical History:  Procedure Laterality Date  . EXTRACORPOREAL SHOCK WAVE LITHOTRIPSY Left 01/30/2017   Procedure: LEFT EXTRACORPOREAL SHOCK WAVE LITHOTRIPSY (ESWL);  Surgeon: Malen Gauze, MD;  Location: [facility];  Service: Urology;  Laterality: Left;  . KIDNEY STONE SURGERY     Social History   Social History  . Marital status: Married    Spouse name: N/A  . Number of children: N/A  . Years of education: N/A   Social History Main Topics  . Smoking status: Never Smoker  . Smokeless tobacco: Never Used  . Alcohol use No  . Drug use: No  . Sexual activity: Not Asked   Other Topics Concern  . None   Social History Narrative  . None   Allergies  Allergen Reactions  . Amoxicillin Hives    Has patient had a PCN reaction causing immediate rash, facial/tongue/throat swelling, SOB or lightheadedness with hypotension: no Has patient had a PCN reaction causing severe rash  involving mucus membranes or skin necrosis: No Has patient had a PCN reaction that required hospitalization No Has patient had a PCN reaction occurring within the last 10 years: No If all of the above answers are "NO", then may proceed with Cephalosporin use.   Family History  Problem Relation Age of Onset  . CVA Mother   . Hypertension Mother   . Diabetes Mellitus II Mother   . Congestive Heart Failure Mother   . CVA Father   . Hypertension Father   . Diabetes Mellitus II Father      Past medical history, social, surgical and family history all reviewed in electronic medical record.  No pertanent information unless stated regarding to the chief complaint.   Review of Systems:Review of systems updated and as accurate as of 06/25/17  No headache, visual changes, nausea, vomiting, diarrhea, constipation, dizziness, abdominal pain, skin rash, fevers, chills, night sweats, weight loss, swollen lymph nodes, chest pain, shortness of breath, mood changes. Positive muscle aches, body aches  Objective  Blood pressure 130/80, pulse 76, height  (1.651 m), weight 235 lb (106.6 kg), SpO2 98 %, currently breastfeeding.  Systems examined below as of 06/25/17   General: No apparent distress alert and oriented x3 mood and affect normal, dressed appropriately.  HEENT: Pupils equal, extraocular movements intact  Respiratory: Patient's speak in full sentences and does not appear short of breath  Cardiovascular: No lower extremity edema,  non tender, no erythema  Skin: Warm dry intact with no signs of infection or rash on extremities or on axial skeleton.  Abdomen: Soft nontender  Neuro: Cranial nerves II through XII are intact, neurovascularly intact in all extremities with 2+ DTRs and 2+ pulses.  Lymph: No lymphadenopathy of posterior or anterior cervical chain or axillae bilaterally.  Gait normal with good balance and coordination.  MSK:  Non tender with full range of motion and good stability  and symmetric strength and tone of shoulders, elbows, wrist, hip, knee and ankles bilaterally.  Back Exam:  Inspection: Loss of lordosis Motion: Flexion 25 deg, Extension 25 deg, Side Bending to 35 deg bilaterally,  Rotation to 35 deg bilaterally  SLR laying: Questionable positive on the left side XSLR laying: Negative  Palpable tenderness: Tender to palpation in the paraspinal musculature lumbar spine. FABER: Tightness bilaterally. Sensory change: Gross sensation intact to all lumbar and sacral dermatomes.  Reflexes: 2+ at both patellar tendons, 2+ at achilles tendons, Babinski's downgoing.  Strength at foot  Plantar-flexion: 5/5 Dorsi-flexion: 5/5 Eversion: 5/5 Inversion: 5/5  Leg strength  Quad: 5/5 Hamstring: 5/5 Hip flexor: 5/5 Hip abductors: 4/5 but symmetric Gait unremarkable.    Impression and Recommendations:     This case required medical decision making of moderate complexity.      Note: This dictation was prepared with Dragon dictation along with smaller phrase technology. Any transcriptional errors that result from this process are unintentional.

## 2017-06-25 NOTE — Patient Instructions (Signed)
Good to see you  We will get an epidural to help with diagnosis and with pain relief I hope.  Decrease gabapentin to  at night I am sorry about your mom.  Call [phone] and you can schedule the injection.  See me again then 2 weeks AFTER the epidural and we will likely get you in with physical therapy then!

## 2017-06-25 NOTE — Assessment & Plan Note (Signed)
Patient has known pars defect. Possibility of a disc protruding causing some nerve impingement causing chronic pain. Patient would be a candidate for an epidural and will be scheduled for this in the near future. Encourage patient to more of an icing, home exercise, which activities doing which ones to avoid. Patient will come back and see me again in 4-6 weeks.

## 2017-07-07 ENCOUNTER — Other Ambulatory Visit: Payer: Self-pay | Admitting: Family Medicine

## 2017-07-07 ENCOUNTER — Ambulatory Visit
Admission: RE | Admit: 2017-07-07 | Discharge: 2017-07-07 | Disposition: A | Discharge status: Home / Self Care | Payer: 59 | Source: Ambulatory Visit | Attending: Family Medicine | Admitting: Family Medicine

## 2017-07-07 DIAGNOSIS — M549 Dorsalgia, unspecified: Secondary | ICD-10-CM

## 2017-07-07 MED ORDER — METHYLPREDNISOLONE ACETATE 40 MG/ML INJ SUSP (RADIOLOG
120.0000 mg | Freq: Once | INTRAMUSCULAR | Status: AC
  Administered 2017-07-07: 120 mg via EPIDURAL

## 2017-07-07 MED ORDER — IOPAMIDOL (ISOVUE-M 200) INJECTION 41%
1.0000 mL | Freq: Once | INTRAMUSCULAR | Status: AC
Start: 2017-07-07 — End: 2017-07-07
  Administered 2017-07-07: 1 mL via EPIDURAL

## 2017-07-07 NOTE — Discharge Instructions (Signed)

## 2017-07-21 NOTE — Progress Notes (Signed)
[name] [address] [place], [place] [address] Phone: [phone] Subjective:     CC: Low back pain follow-up  BJY:NWGNFAOZHY  Kristin [name] is a 31 y.o. female coming in with complaint of low back pain. 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 Continuing to have worsening pain and was sent for an epidural on 07/07/2017. Patient states that the pain came back last week. Today the numbness and tingling have come back in the left leg. Patient states that overall she thinks he may have at least cane for some relief for some time.       Past Medical History:  Diagnosis Date  . History of kidney stones   . Kidney stone   . Kidney stones    Kidney Stones  . Knee joint pain   . Pregnancy induced hypertension   . Syncope    Past Surgical History:  Procedure Laterality Date  . EXTRACORPOREAL SHOCK WAVE LITHOTRIPSY Left 01/30/2017   Procedure: LEFT EXTRACORPOREAL SHOCK WAVE LITHOTRIPSY (ESWL);  Surgeon: Malen Gauze, MD;  Location: [facility];  Service: Urology;  Laterality: Left;  . KIDNEY STONE SURGERY     Social History   Social History  . Marital status: Married    Spouse name: N/A  . Number of children: N/A  . Years of education: N/A   Social History Main Topics  . Smoking status: Never Smoker  . Smokeless tobacco: Never Used  . Alcohol use No  . Drug use: No  . Sexual activity: Not on file   Other Topics Concern  . Not on file   Social History Narrative  . No narrative on file   Allergies  Allergen Reactions  . Amoxicillin Hives    Has patient had a PCN reaction causing immediate rash, facial/tongue/throat swelling, SOB or lightheadedness with hypotension: no Has patient had a PCN reaction causing severe rash involving mucus membranes or skin necrosis: No Has patient had a PCN reaction that required hospitalization No Has patient had a PCN reaction occurring within the last 10 years: No If all of  the above answers are "NO", then may proceed with Cephalosporin use.   Family History  Problem Relation Age of Onset  . CVA Mother   . Hypertension Mother   . Diabetes Mellitus II Mother   . Congestive Heart Failure Mother   . CVA Father   . Hypertension Father   . Diabetes Mellitus II Father      Past medical history, social, surgical and family history all reviewed in electronic medical record.  No pertanent information unless stated regarding to the chief complaint.   Review of Systems:Review of systems updated and as accurate as of 07/22/17  No headache, visual changes, nausea, vomiting, diarrhea, constipation, dizziness, abdominal pain, skin rash, fevers, chills, night sweats, weight loss, swollen lymph nodes, body aches, joint swelling, , chest pain, shortness of breath, mood changes. Positive muscle aches  Objective  Blood pressure (!) 142/98, pulse 63, height 5\' 5"  (1.651 m), weight 235 lb (106.6 kg), SpO2 100 %, currently breastfeeding.  Systems examined below as of 07/22/17   General: No apparent distress alert and oriented x3 mood and affect normal, dressed appropriately.  HEENT: Pupils equal, extraocular movements intact  Respiratory: Patient's speak in full sentences and does not appear short of breath  Cardiovascular: No lower extremity edema, non tender, no erythema  Skin: Warm dry intact with no signs of infection  or rash on extremities or on axial skeleton.  Abdomen: Soft nontender  Neuro: Cranial nerves II through XII are intact, neurovascularly intact in all extremities with 2+ DTRs and 2+ pulses.  Lymph: No lymphadenopathy of posterior or anterior cervical chain or axillae bilaterally.  Gait normal with good balance and coordination.  MSK:  Non tender with full range of motion and good stability and symmetric strength and tone of shoulders, elbows, wrist, hip, knee and ankles bilaterally.  Back Exam:  Inspection: Mild loss of lordosis with poor core  strength Motion: Flexion 35 deg, Extension 25 deg, Side Bending to 35 deg bilaterally,  Rotation to 45 deg bilaterally  SLR laying: Negative  XSLR laying: Negative  Palpable tenderness: Tender to palpation of the patient's musculature lumbar spine right greater left. FABER: Tightness bilaterally. Sensory change: Gross sensation intact to all lumbar and sacral dermatomes.  Reflexes: 2+ at both patellar tendons, 2+ at achilles tendons, Babinski's downgoing.  Strength at foot  Plantar-flexion: 5/5 Dorsi-flexion: 5/5 Eversion: 5/5 Inversion: 5/5  Leg strength  Quad: 5/5 Hamstring: 5/5 Hip flexor: 5/5 Hip abductors: 5/5  Gait unremarkable.  Osteopathic findings  C6 flexed rotated and side bent right T3 extended rotated and side bent right inhaled third rib T5 extended rotated and side bent right  L2 flexed rotated and side bent right Sacrum right on right     Impression and Recommendations:     This case required medical decision making of moderate complexity.      Note: This dictation was prepared with Dragon dictation along with smaller phrase technology. Any transcriptional errors that result from this process are unintentional.

## 2017-07-22 ENCOUNTER — Encounter: Payer: Self-pay | Admitting: Family Medicine

## 2017-07-22 ENCOUNTER — Ambulatory Visit (INDEPENDENT_AMBULATORY_CARE_PROVIDER_SITE_OTHER): Payer: 59 | Admitting: Family Medicine

## 2017-07-22 DIAGNOSIS — M4306 Spondylolysis, lumbar region: Secondary | ICD-10-CM

## 2017-07-22 DIAGNOSIS — M999 Biomechanical lesion, unspecified: Secondary | ICD-10-CM | POA: Insufficient documentation

## 2017-07-22 MED ORDER — VENLAFAXINE HCL ER 37.5 MG PO CP24: 37.5000 mg | ORAL_CAPSULE | Freq: Every day | ORAL | 1 refills | Status: DC

## 2017-07-22 NOTE — Patient Instructions (Signed)
Good to see you  Ice is your friend Exercises 3 times a week.  Effexor 37.5 mg daily  See me again in 4 weeks.

## 2017-07-22 NOTE — Assessment & Plan Note (Signed)
Responded somewhat to the epidural. We'll continue to monitor. Encourage her to take the gabapentin on a more regular basis. Started Effexor see if this will help with some of the chronic pain that she is having. We discussed osteo-passive manipulation. We discussed icing regimen. Patient will try to make these changes and come back and see me again in 4 weeks.

## 2017-07-22 NOTE — Assessment & Plan Note (Signed)
Decision today to treat with OMT was based on Physical Exam  After verbal consent patient was treated with HVLA, ME, FPR techniques in cervical, thoracic, lumbar and sacral areas  Patient tolerated the procedure well with improvement in symptoms  Patient given exercises, stretches and lifestyle modifications  See medications in patient instructions if given  Patient will follow up in 4-6 weeks 

## 2017-08-13 ENCOUNTER — Other Ambulatory Visit: Payer: Self-pay | Admitting: *Deleted

## 2017-08-13 MED ORDER — GABAPENTIN 100 MG PO CAPS: 200.0000 mg | ORAL_CAPSULE | Freq: Every day | ORAL | 1 refills | Status: DC

## 2017-08-13 NOTE — Telephone Encounter (Signed)
Refill done.  

## 2017-08-18 ENCOUNTER — Other Ambulatory Visit: Payer: Self-pay | Admitting: *Deleted

## 2017-08-18 MED ORDER — VENLAFAXINE HCL ER 37.5 MG PO CP24: 37.5000 mg | ORAL_CAPSULE | Freq: Every day | ORAL | 1 refills | Status: DC

## 2017-08-18 MED ORDER — GABAPENTIN 100 MG PO CAPS: 200.0000 mg | ORAL_CAPSULE | Freq: Every day | ORAL | 1 refills | Status: DC

## 2017-08-18 NOTE — Progress Notes (Signed)
[facility] Scale[facility] D.O[facility] [address] [place], [place] [address] Phone: [phone] Subjective:      CC: Low back pain  BJY:NWGNFAOZHYHPI:Subjective  Kristin [name] is a 31 y.o. female coming in for follow up for back pain. She said that her back is still bothering her. She has pain at work due to the hard chairs and when standing from a seated position. She states that the Effexor is making her more anxious.  Patient did have epidural back in October and was doing much better.  Over the course last 2 weeks started worsening symptoms again.  More tightness.  States that getting up from a seated position seems to be more difficult as well.  Her left forearm is also bothering her since last week. Noticeable edema on posterior aspect of forearm. Patient does not have pain but tightness. Does not recall a mechanism of injury.     Past Medical History:  Diagnosis Date  . History of kidney stones   . Kidney stone   . Kidney stones    Kidney Stones  . Knee joint pain   . Pregnancy induced hypertension   . Syncope    Past Surgical History:  Procedure Laterality Date  . EXTRACORPOREAL SHOCK WAVE LITHOTRIPSY Left 01/30/2017   Procedure: LEFT EXTRACORPOREAL SHOCK WAVE LITHOTRIPSY (ESWL);  Surgeon: Malen GauzeMcKenzie, Patrick L, MD;  Location: [facility];  Service: Urology;  Laterality: Left;  . KIDNEY STONE SURGERY     Social History   Socioeconomic History  . Marital status: Married    Spouse name: None  . Number of children: None  . Years of education: None  . Highest education level: None  Social Needs  . Financial resource strain: None  . Food insecurity - worry: None  . Food insecurity - inability: None  . Transportation needs - medical: None  . Transportation needs - non-medical: None  Occupational History  . None  Tobacco Use  . Smoking status: Never Smoker  . Smokeless tobacco: Never Used  Substance and Sexual Activity  . Alcohol use: No  . Drug use: No  . Sexual activity:  None  Other Topics Concern  . None  Social History Narrative  . None   Allergies  Allergen Reactions  . Amoxicillin Hives    Has patient had a PCN reaction causing immediate rash, facial/tongue/throat swelling, SOB or lightheadedness with hypotension: no Has patient had a PCN reaction causing severe rash involving mucus membranes or skin necrosis: No Has patient had a PCN reaction that required hospitalization No Has patient had a PCN reaction occurring within the last 10 years: No If all of the above answers are "NO", then may proceed with Cephalosporin use.   Family History  Problem Relation Age of Onset  . CVA Mother   . Hypertension Mother   . Diabetes Mellitus II Mother   . Congestive Heart Failure Mother   . CVA Father   . Hypertension Father   . Diabetes Mellitus II Father      Past medical history, social, surgical and family history all reviewed in electronic medical record.  No pertanent information unless stated regarding to the chief complaint.   Review of Systems:Review of systems updated and as accurate as of 08/19/17  No headache, visual changes, nausea, vomiting, diarrhea, constipation, dizziness, abdominal pain, skin rash, fevers, chills, night sweats, weight loss, swollen lymph nodes, body aches, joint swelling, mchest pain, shortness of breath, mood changes.  Positive muscle aches  Objective  Blood pressure 110/82, pulse 73, height 5\' 5"  (1.651 m), weight 234 lb (106.1 kg), SpO2 98 %, currently breastfeeding. Systems examined below as of 08/19/17   General: No apparent distress alert and oriented x3 mood and affect normal, dressed appropriately.  HEENT: Pupils equal, extraocular movements intact  Respiratory: Patient's speak in full sentences and does not appear short of breath  Cardiovascular: No lower extremity edema, non tender, no erythema  Skin: Warm dry intact with no signs of infection or rash on extremities or on axial skeleton.  Abdomen: Soft  nontender  Neuro: Cranial nerves II through XII are intact, neurovascularly intact in all extremities with 2+ DTRs and 2+ pulses.  Lymph: No lymphadenopathy of posterior or anterior cervical chain or axillae bilaterally.  Gait normal with good balance and coordination.  MSK:  Non tender with full range of motion and good stability and symmetric strength and tone of shoulders, elbows, wrist, hip, knee and ankles bilaterally.   Back Exam:  Inspection: Mild loss of lordosis Motion: Flexion 35 deg, Extension 25 deg, Side Bending to 35 deg bilaterally,  Rotation to 45 deg bilaterally  SLR laying: Mild positive on the left side XSLR laying: Negative  Palpable tenderness: Tender to palpation in the paraspinal musculature lumbar spine. FABER: Tightness bilaterally. Sensory change: Gross sensation intact to all lumbar and sacral dermatomes.  Reflexes: 2+ at both patellar tendons, 2+ at achilles tendons, Babinski's downgoing.  Strength at foot  4+ out of 5 but symmetric  Osteopathic findings C2 flexed rotated and side bent right C4 flexed rotated and side bent left C6 flexed rotated and side bent left T3 extended rotated and side bent right inhaled third rib T9 extended rotated and side bent left L2 flexed rotated and side bent right Sacrum right on right     Impression and Recommendations:     This case required medical decision making of moderate complexity.      Note: This dictation was prepared with Dragon dictation along with smaller phrase technology. Any transcriptional errors that result from this process are unintentional.

## 2017-08-18 NOTE — Telephone Encounter (Signed)
Refill done.  

## 2017-08-19 ENCOUNTER — Encounter: Payer: Self-pay | Admitting: Family Medicine

## 2017-08-19 ENCOUNTER — Ambulatory Visit: Payer: 59 | Admitting: Family Medicine

## 2017-08-19 VITALS — BP 110/82 | HR 73 | Ht 65.0 in | Wt 234.0 lb

## 2017-08-19 DIAGNOSIS — M999 Biomechanical lesion, unspecified: Secondary | ICD-10-CM | POA: Diagnosis not present

## 2017-08-19 DIAGNOSIS — M5416 Radiculopathy, lumbar region: Secondary | ICD-10-CM | POA: Diagnosis not present

## 2017-08-19 DIAGNOSIS — M4306 Spondylolysis, lumbar region: Secondary | ICD-10-CM

## 2017-08-19 MED ORDER — VENLAFAXINE HCL ER 75 MG PO CP24: 75.0000 mg | ORAL_CAPSULE | Freq: Every day | ORAL | 1 refills | Status: DC

## 2017-08-19 NOTE — Assessment & Plan Note (Signed)
Patient does have a pars defect.  Patient encouraged to increase activity as tolerated.  Patient was having some worsening symptoms and some radicular symptoms.  Patient will be set up for another epidural.  Did respond well to the first 1.  Patient will continue the same medications and will increase the Effexor and see official.  Follow-up again in 4-6 weeks

## 2017-08-19 NOTE — Assessment & Plan Note (Signed)
Decision today to treat with OMT was based on Physical Exam  After verbal consent patient was treated with HVLA, ME, FPR techniques in cervical, thoracic, lumbar and sacral areas  Patient tolerated the procedure well with improvement in symptoms  Patient given exercises, stretches and lifestyle modifications  See medications in patient instructions if given  Patient will follow up in 4-6 weeks 

## 2017-08-19 NOTE — Patient Instructions (Signed)
Good to see you  Kristin [name] is your friend.  Stay active.  We will increase effexor to 75mg  daily  Also we will do another epidural  See me again in 4 weeks!

## 2017-08-25 ENCOUNTER — Other Ambulatory Visit: Payer: Self-pay | Admitting: Family Medicine

## 2017-08-28 ENCOUNTER — Ambulatory Visit
Admission: RE | Admit: 2017-08-28 | Discharge: 2017-08-28 | Disposition: A | Discharge status: Home / Self Care | Payer: 59 | Source: Ambulatory Visit | Attending: Family Medicine | Admitting: Family Medicine

## 2017-08-28 DIAGNOSIS — M5416 Radiculopathy, lumbar region: Secondary | ICD-10-CM

## 2017-08-28 MED ORDER — METHYLPREDNISOLONE ACETATE 40 MG/ML INJ SUSP (RADIOLOG
120.0000 mg | Freq: Once | INTRAMUSCULAR | Status: AC
  Administered 2017-08-28: 120 mg via EPIDURAL

## 2017-08-28 MED ORDER — IOPAMIDOL (ISOVUE-M 200) INJECTION 41%
1.0000 mL | Freq: Once | INTRAMUSCULAR | Status: AC
  Administered 2017-08-28: 1 mL via EPIDURAL

## 2017-08-28 NOTE — Discharge Instructions (Signed)

## 2017-10-21 NOTE — Progress Notes (Signed)
[name] [address] [place], [place] [address] Phone: [phone] Subjective:     CC: Low back pain  BJY:NWGNFAOZHY  Kristin [name] is a 32 y.o. female coming in with complaint of low back pain.  Patient has a history of pars defect at L5.  Started on Effexor.  Has responded fairly well to osteopathic manipulation.  Patient states that she had some relief from the last epidural given on 08/28/17. She is having radicular symptoms on the left leg. She has pain with standing or sitting for prolonged periods of time. Waking up in the morning she also has a lot of pain.  Patient states that still going away sooner.  Did have good response with the osteopathic manipulation previously.      Past Medical History:  Diagnosis Date  . History of kidney stones   . Kidney stone   . Kidney stones    Kidney Stones  . Knee joint pain   . Pregnancy induced hypertension   . Syncope    Past Surgical History:  Procedure Laterality Date  . EXTRACORPOREAL SHOCK WAVE LITHOTRIPSY Left 01/30/2017   Procedure: LEFT EXTRACORPOREAL SHOCK WAVE LITHOTRIPSY (ESWL);  Surgeon: Malen Gauze, MD;  Location: [facility];  Service: Urology;  Laterality: Left;  . KIDNEY STONE SURGERY     Social History   Socioeconomic History  . Marital status: Married    Spouse name: Not on file  . Number of children: Not on file  . Years of education: Not on file  . Highest education level: Not on file  Social Needs  . Financial resource strain: Not on file  . Food insecurity - worry: Not on file  . Food insecurity - inability: Not on file  . Transportation needs - medical: Not on file  . Transportation needs - non-medical: Not on file  Occupational History  . Not on file  Tobacco Use  . Smoking status: Never Smoker  . Smokeless tobacco: Never Used  Substance and Sexual Activity  . Alcohol use: No  . Drug use: No  . Sexual activity: Not on file  Other Topics Concern  . Not on  file  Social History Narrative  . Not on file   Allergies  Allergen Reactions  . Amoxicillin Hives    Has patient had a PCN reaction causing immediate rash, facial/tongue/throat swelling, SOB or lightheadedness with hypotension: no Has patient had a PCN reaction causing severe rash involving mucus membranes or skin necrosis: No Has patient had a PCN reaction that required hospitalization No Has patient had a PCN reaction occurring within the last 10 years: No If all of the above answers are "NO", then may proceed with Cephalosporin use.   Family History  Problem Relation Age of Onset  . CVA Mother   . Hypertension Mother   . Diabetes Mellitus II Mother   . Congestive Heart Failure Mother   . CVA Father   . Hypertension Father   . Diabetes Mellitus II Father      Past medical history, social, surgical and family history all reviewed in electronic medical record.  No pertanent information unless stated regarding to the chief complaint.   Review of Systems:Review of systems updated and as accurate as of 10/21/17  No headache, visual changes, nausea, vomiting, diarrhea, constipation, dizziness, abdominal pain, skin rash, fevers, chills, night sweats, weight loss, swollen lymph nodes, body aches, joint swelling,  chest pain, shortness of breath, mood changes.  Positive muscle  aches  Objective  currently breastfeeding. Systems examined below as of 10/21/17   General: No apparent distress alert and oriented x3 mood and affect normal, dressed appropriately.  HEENT: Pupils equal, extraocular movements intact  Respiratory: Patient's speak in full sentences and does not appear short of breath  Cardiovascular: No lower extremity edema, non tender, no erythema  Skin: Warm dry intact with no signs of infection or rash on extremities or on axial skeleton.  Abdomen: Soft nontender  Neuro: Cranial nerves II through XII are intact, neurovascularly intact in all extremities with 2+ DTRs and 2+  pulses.  Lymph: No lymphadenopathy of posterior or anterior cervical chain or axillae bilaterally.  Gait normal with good balance and coordination.  MSK:  Non tender with full range of motion and good stability and symmetric strength and tone of shoulders, elbows, wrist, hip, knee and ankles bilaterally.  Back Exam:  Inspection: Loss of lordosis Motion: Flexion 45 deg, Extension 25 deg, Side Bending to 30 deg bilaterally,  Rotation to 45 deg bilaterally  SLR laying: Mild positive straight leg left XSLR laying: Negative  Palpable tenderness: Tender to palpation in the paraspinal musculature. FABER: Tightness bilaterally. Sensory change: Gross sensation intact to all lumbar and sacral dermatomes.  Reflexes: 2+ at both patellar tendons, 2+ at achilles tendons, Babinski's downgoing.  Strength at foot  Plantar-flexion: 5/5 Dorsi-flexion: 5/5 Eversion: 5/5 Inversion: 5/5  Leg strength  Quad: 5/5 Hamstring: 5/5 Hip flexor: 5/5 Hip abductors: 5/5  Gait unremarkable.  Osteopathic findings  C2 flexed rotated and side bent right C4 flexed rotated and side bent left C6 flexed rotated and side bent left T3 extended rotated and side bent right inhaled third rib T6 extended rotated and side bent left L2 flexed rotated and side bent right L4 flexed rotated side bent left Sacrum right on right     Impression and Recommendations:     This case required medical decision making of moderate complexity.      Note: This dictation was prepared with Dragon dictation along with smaller phrase technology. Any transcriptional errors that result from this process are unintentional.

## 2017-10-22 ENCOUNTER — Ambulatory Visit: Payer: 59 | Admitting: Family Medicine

## 2017-10-22 ENCOUNTER — Encounter: Payer: Self-pay | Admitting: Family Medicine

## 2017-10-22 VITALS — BP 116/78 | HR 74 | Ht 65.0 in | Wt 239.0 lb

## 2017-10-22 DIAGNOSIS — M5416 Radiculopathy, lumbar region: Secondary | ICD-10-CM | POA: Diagnosis not present

## 2017-10-22 DIAGNOSIS — M4306 Spondylolysis, lumbar region: Secondary | ICD-10-CM

## 2017-10-22 DIAGNOSIS — M999 Biomechanical lesion, unspecified: Secondary | ICD-10-CM | POA: Diagnosis not present

## 2017-10-22 NOTE — Patient Instructions (Signed)
Good to see you  Ice is your friend Continue the exercises We will try the injection again  See me again 3 weeks AFTER the injection

## 2017-10-22 NOTE — Assessment & Plan Note (Signed)
Decision today to treat with OMT was based on Physical Exam  After verbal consent patient was treated with HVLA, ME, FPR techniques in cervical, thoracic, lumbar and sacral areas  Patient tolerated the procedure well with improvement in symptoms  Patient given exercises, stretches and lifestyle modifications  See medications in patient instructions if given  Patient will follow up in 4-6 weeks 

## 2017-10-22 NOTE — Assessment & Plan Note (Signed)
Pars defect noted.  Patient is doing relatively well but is having some worsening pain.  Did respond well to an epidural previously.  We will reorder it again.  Attempted osteopathic manipulation and hopefully this will be beneficial.  Follow-up again in 3 weeks after the injection.

## 2017-11-03 ENCOUNTER — Ambulatory Visit
Admission: RE | Admit: 2017-11-03 | Discharge: 2017-11-03 | Disposition: A | Discharge status: Home / Self Care | Payer: 59 | Source: Ambulatory Visit | Attending: Family Medicine | Admitting: Family Medicine

## 2017-11-03 DIAGNOSIS — M5416 Radiculopathy, lumbar region: Secondary | ICD-10-CM

## 2017-11-03 MED ORDER — IOPAMIDOL (ISOVUE-M 200) INJECTION 41%
1.0000 mL | Freq: Once | INTRAMUSCULAR | Status: AC
  Administered 2017-11-03: 1 mL via EPIDURAL

## 2017-11-03 MED ORDER — METHYLPREDNISOLONE ACETATE 40 MG/ML INJ SUSP (RADIOLOG
120.0000 mg | Freq: Once | INTRAMUSCULAR | Status: AC
  Administered 2017-11-03: 120 mg via EPIDURAL

## 2017-11-03 NOTE — Discharge Instructions (Signed)

## 2017-11-08 ENCOUNTER — Other Ambulatory Visit: Payer: Self-pay | Admitting: Family Medicine

## 2017-11-10 NOTE — Telephone Encounter (Signed)
Refill done.  

## 2017-12-07 NOTE — Progress Notes (Signed)
Kristin [name] [address] [place], [place] [address] Phone: [phone] Subjective:     CC: Back pain follow-up  BJY:NWGNFAOZHY  Kristin [name] is a 32 y.o. female coming in with complaint of back pain follow-up.  Patient has been seen previously.  Does have a pars defect at L5 that causes some spinal stenosis.  Has responded fairly well to 2 epidurals.  On Effexor 75 mg, doing home exercises as well as osteopathic manipulation.  Patient states she has left leg pain post epidural.  Patient continues to have pain.  Concerned because it seemed to be worsening at the moment.  Wanting to know what other things can be tried.      Past Medical History:  Diagnosis Date  . History of kidney stones   . Kidney stone   . Kidney stones    Kidney Stones  . Knee joint pain   . Pregnancy induced hypertension   . Syncope    Past Surgical History:  Procedure Laterality Date  . EXTRACORPOREAL SHOCK WAVE LITHOTRIPSY Left 01/30/2017   Procedure: LEFT EXTRACORPOREAL SHOCK WAVE LITHOTRIPSY (ESWL);  Surgeon: Malen Gauze, MD;  Location: [facility];  Service: Urology;  Laterality: Left;  . KIDNEY STONE SURGERY     Social History   Socioeconomic History  . Marital status: Married    Spouse name: None  . Number of children: None  . Years of education: None  . Highest education level: None  Social Needs  . Financial resource strain: None  . Food insecurity - worry: None  . Food insecurity - inability: None  . Transportation needs - medical: None  . Transportation needs - non-medical: None  Occupational History  . None  Tobacco Use  . Smoking status: Never Smoker  . Smokeless tobacco: Never Used  Substance and Sexual Activity  . Alcohol use: No  . Drug use: No  . Sexual activity: None  Other Topics Concern  . None  Social History Narrative  . None   Allergies  Allergen Reactions  . Amoxicillin Hives    Has patient had a PCN reaction causing immediate  rash, facial/tongue/throat swelling, SOB or lightheadedness with hypotension: no Has patient had a PCN reaction causing severe rash involving mucus membranes or skin necrosis: No Has patient had a PCN reaction that required hospitalization No Has patient had a PCN reaction occurring within the last 10 years: No If all of the above answers are "NO", then may proceed with Cephalosporin use.   Family History  Problem Relation Age of Onset  . CVA Mother   . Hypertension Mother   . Diabetes Mellitus II Mother   . Congestive Heart Failure Mother   . CVA Father   . Hypertension Father   . Diabetes Mellitus II Father      Past medical history, social, surgical and family history all reviewed in electronic medical record.  No pertanent information unless stated regarding to the chief complaint.   Review of Systems:Review of systems updated and as accurate as of 12/08/17  No headache, visual changes, nausea, vomiting, diarrhea, constipation, dizziness, abdominal pain, skin rash, fevers, chills, night sweats, weight loss, swollen lymph nodes, body aches, joint swelling, chest pain, shortness of breath, mood changes.  Positive muscle aches  Objective  Blood pressure 130/90, pulse 64, height 5\' 5"  (1.651 m), weight 239 lb (108.4 kg), SpO2 97 %, currently breastfeeding. Systems examined below as of 12/08/17   General: No apparent distress alert and  oriented x3 mood and affect normal, dressed appropriately.  HEENT: Pupils equal, extraocular movements intact  Respiratory: Patient's speak in full sentences and does not appear short of breath  Cardiovascular: No lower extremity edema, non tender, no erythema  Skin: Warm dry intact with no signs of infection or rash on extremities or on axial skeleton.  Abdomen: Soft nontender  Neuro: Cranial nerves II through XII are intact, neurovascularly intact in all extremities with 2+ DTRs and 2+ pulses.  Lymph: No lymphadenopathy of posterior or anterior  cervical chain or axillae bilaterally.  Gait normal with good balance and coordination.  MSK:  Non tender with full range of motion and good stability and symmetric strength and tone of shoulders, elbows, wrist, hip, knee and ankles bilaterally.  Back Exam:  Inspection: Unremarkable  Motion: Flexion 40 deg, Extension 25 deg, Side Bending to 35 deg bilaterally,  Rotation to 43 deg bilaterally  SLR laying: Negative  XSLR laying: Negative  Palpable tenderness: Tender to palpation the paraspinal musculature on the left side of the spine. FABER: negative. Sensory change: Gross sensation intact to all lumbar and sacral dermatomes.  Reflexes: 2+ at both patellar tendons, 2+ at achilles tendons, Babinski's downgoing.  Strength at foot  Plantar-flexion: 5/5 Dorsi-flexion: 5/5 Eversion: 5/5 Inversion: 5/5  Leg strength  Quad: 5/5 Hamstring: 5/5 Hip flexor: 5/5 Hip abductors: 4/5 but symmetric Gait unremarkable.      Impression and Recommendations:     This case required medical decision making of moderate complexity.      Note: This dictation was prepared with Dragon dictation along with smaller phrase technology. Any transcriptional errors that result from this process are unintentional.

## 2017-12-08 ENCOUNTER — Encounter: Payer: Self-pay | Admitting: Family Medicine

## 2017-12-08 ENCOUNTER — Ambulatory Visit: Payer: 59 | Admitting: Family Medicine

## 2017-12-08 VITALS — BP 130/90 | HR 64 | Ht 65.0 in | Wt 239.0 lb

## 2017-12-08 DIAGNOSIS — M549 Dorsalgia, unspecified: Secondary | ICD-10-CM | POA: Diagnosis not present

## 2017-12-08 DIAGNOSIS — M4306 Spondylolysis, lumbar region: Secondary | ICD-10-CM

## 2017-12-08 DIAGNOSIS — M999 Biomechanical lesion, unspecified: Secondary | ICD-10-CM | POA: Diagnosis not present

## 2017-12-08 MED ORDER — VENLAFAXINE HCL ER 150 MG PO CP24: 150.0000 mg | ORAL_CAPSULE | Freq: Every day | ORAL | 3 refills | Status: DC

## 2017-12-08 NOTE — Patient Instructions (Signed)
Im sorry you are hurting  B6 200mg  daily  Ice is your friend Increase effexor to 150mg  daily and hope it turns down the nerve PT will be calling you  See me again 3 weeks and if not better we will try piriformis injection

## 2017-12-08 NOTE — Assessment & Plan Note (Signed)
Decision today to treat with OMT was based on Physical Exam  After verbal consent patient was treated with HVLA, ME, FPR techniques in cervical, thoracic, lumbar and sacral areas  Patient tolerated the procedure well with improvement in symptoms  Patient given exercises, stretches and lifestyle modifications  See medications in patient instructions if given  Patient will follow up in 4-6 weeks 

## 2017-12-08 NOTE — Assessment & Plan Note (Signed)
Still believe that this is multifactorial.  Patient is overweight and does have a pars defect.  Was responding fairly well to osteopathic manipulation and injections.  Increased Effexor to 150 mg.  Discussed icing regimen and home exercise.  Topical anti-inflammatories given.  Follow-up again in 4-6 weeks

## 2017-12-29 NOTE — Progress Notes (Signed)
[name] [address] [place], [place] [address] Phone: [phone] Subjective:     CC: Back pain follow-up  [id number]  [name] is a 32 y.o. female coming in with complaint of back pain.  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  Patient was given an epidural and did have some mild improvement.  Started Effexor and was doing well at 75 mg but continued to have discomfort.  Increase to 150 mg as well as another repeat injection and started with formal physical therapy.  Continues to have more of a left buttocks pain with radiation down the leg.     Past Medical History:  Diagnosis Date  . History of kidney stones   . Kidney stone   . Kidney stones    Kidney Stones  . Knee joint pain   . Pregnancy induced hypertension   . Syncope    Past Surgical History:  Procedure Laterality Date  . EXTRACORPOREAL SHOCK WAVE LITHOTRIPSY Left 01/30/2017   Procedure: LEFT EXTRACORPOREAL SHOCK WAVE LITHOTRIPSY (ESWL);  Surgeon: Malen Gauze, MD;  Location: [facility];  Service: Urology;  Laterality: Left;  . KIDNEY STONE SURGERY     Social History   Socioeconomic History  . Marital status: Married    Spouse name: Not on file  . Number of children: Not on file  . Years of education: Not on file  . Highest education level: Not on file  Occupational History  . Not on file  Social Needs  . Financial resource strain: Not on file  . Food insecurity:    Worry: Not on file    Inability: Not on file  . Transportation needs:    Medical: Not on file    Non-medical: Not on file  Tobacco Use  . Smoking status: Never Smoker  . Smokeless tobacco: Never Used  Substance and Sexual Activity  . Alcohol use: No  . Drug use: No  . Sexual activity: Not on file  Lifestyle  . Physical activity:    Days per week: Not on file    Minutes per session: Not on file  . Stress: Not on file  Relationships  . Social connections:    Talks on phone: Not  on file    Gets together: Not on file    Attends religious service: Not on file    Active member of club or organization: Not on file    Attends meetings of clubs or organizations: Not on file    Relationship status: Not on file  Other Topics Concern  . Not on file  Social History Narrative  . Not on file   Allergies  Allergen Reactions  . Amoxicillin Hives    Has patient had a PCN reaction causing immediate rash, facial/tongue/throat swelling, SOB or lightheadedness with hypotension: no Has patient had a PCN reaction causing severe rash involving mucus membranes or skin necrosis: No Has patient had a PCN reaction that required hospitalization No Has patient had a PCN reaction occurring within the last 10 years: No If all of the above answers are "NO", then may proceed with Cephalosporin use.   Family History  Problem Relation Age of Onset  . CVA Mother   . Hypertension Mother   . Diabetes Mellitus II Mother   . Congestive Heart Failure Mother   . CVA Father   . Hypertension Father   . Diabetes Mellitus II Father      Past medical history,  social, surgical and family history all reviewed in electronic medical record.  No pertanent information unless stated regarding to the chief complaint.   Review of Systems:Review of systems updated and as accurate as of 12/30/17  No headache, visual changes, nausea, vomiting, diarrhea, constipation, dizziness, abdominal pain, skin rash, fevers, chills, night sweats, weight loss, swollen lymph nodes, body aches, joint swelling, muscle aches, chest pain, shortness of breath, mood changes.   Objective  Blood pressure (!) 144/84, pulse 67, height 5\' 5"  (1.651 m), weight 244 lb (110.7 kg), SpO2 97 %, currently breastfeeding. Systems examined below as of 12/30/17   General: No apparent distress alert and oriented x3 mood and affect normal, dressed appropriately.  HEENT: Pupils equal, extraocular movements intact  Respiratory: Patient's speak  in full sentences and does not appear short of breath  Cardiovascular: No lower extremity edema, non tender, no erythema  Skin: Warm dry intact with no signs of infection or rash on extremities or on axial skeleton.  Abdomen: Soft nontender  Neuro: Cranial nerves II through XII are intact, neurovascularly intact in all extremities with 2+ DTRs and 2+ pulses.  Lymph: No lymphadenopathy of posterior or anterior cervical chain or axillae bilaterally.  Gait normal with good balance and coordination.  MSK:  Non tender with full range of motion and good stability and symmetric strength and tone of shoulders, elbows, wrist, hip, knee and ankles bilaterally.  Back Exam:  Inspection: Loss of lordosis and poor core strength Motion: Flexion 45 deg, Extension 15 deg, Side Bending to 35 deg bilaterally,  Rotation to 30 deg bilaterally  SLR laying: Negative  XSLR laying: Negative  Palpable tenderness: Tender to palpation the paraspinal musculature lumbar spine right greater than left.  More pain over the left piriformis FABER: Positive left. Sensory change: Gross sensation intact to all lumbar and sacral dermatomes.  Reflexes: 2+ at both patellar tendons, 2+ at achilles tendons, Babinski's downgoing.  Strength at foot  Plantar-flexion: 5/5 Dorsi-flexion: 5/5 Eversion: 5/5 Inversion: 5/5  Leg strength  Quad: 5/5 Hamstring: 5/5 Hip flexor: 5/5 Hip abductors: 4/5 on left Procedure: Real-time Ultrasound Guided Injection of left piriformis Device: GE Logiq Q7 Ultrasound guided injection is preferred based studies that show increased duration, increased effect, greater accuracy, decreased procedural pain, increased response rate, and decreased cost with ultrasound guided versus blind injection.  Verbal informed consent obtained.  Time-out conducted.  Noted no overlying erythema, induration, or other signs of local infection.  Skin prepped in a sterile fashion.  Local anesthesia: Topical Ethyl chloride.    With sterile technique and under real time ultrasound guidance: With a 21-gauge 2 inch needle patient was injected with 0.5 cc of 0.5% Marcaine and 1 cc of Kenalog 40 mg/mL into the left piriformis tendon sheath. Completed without difficulty  Pain immediately resolved suggesting accurate placement of the medication.  Advised to call if fevers/chills, erythema, induration, drainage, or persistent bleeding.  Images permanently stored and available for review in the ultrasound unit.  Impression: Technically successful ultrasound guided injection.  Impression and Recommendations:     This case required medical decision making of moderate complexity.      Note: This dictation was prepared with Dragon dictation along with smaller phrase technology. Any transcriptional errors that result from this process are unintentional.

## 2017-12-30 ENCOUNTER — Encounter: Payer: Self-pay | Admitting: Family Medicine

## 2017-12-30 ENCOUNTER — Ambulatory Visit: Payer: Self-pay

## 2017-12-30 ENCOUNTER — Ambulatory Visit: Payer: 59 | Admitting: Family Medicine

## 2017-12-30 VITALS — BP 144/84 | HR 67 | Ht 65.0 in | Wt 244.0 lb

## 2017-12-30 DIAGNOSIS — M25551 Pain in right hip: Secondary | ICD-10-CM

## 2017-12-30 DIAGNOSIS — G5702 Lesion of sciatic nerve, left lower limb: Secondary | ICD-10-CM

## 2017-12-30 HISTORY — DX: Lesion of sciatic nerve, left lower limb: G57.02

## 2017-12-30 NOTE — Patient Instructions (Addendum)
Good to see you  Tried an injection in the piriformis and see how it does  Then see me again in 3 weeks and we will likely start manipulation.  You will do great I think! Continue everything else! Happy birthday!!!

## 2017-12-30 NOTE — Assessment & Plan Note (Signed)
Patient given an injection today.  This will help us with differential.  Still likely more from the back.  Patient's pars defect though patient will be looking at a fusion of the back.  Hopefully this will be beneficial.  We discussed if it is only temporary and then may need to consider PRP.  Continue the home exercise, formal physical therapy, gabapentin at this time as well as Effexor.  Follow-up with me again in 3-4 weeks

## 2018-01-08 ENCOUNTER — Ambulatory Visit: Payer: 59 | Admitting: Physical Therapy

## 2018-01-08 ENCOUNTER — Other Ambulatory Visit: Payer: Self-pay | Admitting: *Deleted

## 2018-01-08 MED ORDER — VENLAFAXINE HCL ER 150 MG PO CP24: 150.0000 mg | ORAL_CAPSULE | Freq: Every day | ORAL | 1 refills | Status: DC

## 2018-01-08 NOTE — Telephone Encounter (Signed)
Refill done.  

## 2018-01-10 DIAGNOSIS — F419 Anxiety disorder, unspecified: Secondary | ICD-10-CM | POA: Insufficient documentation

## 2018-01-10 DIAGNOSIS — M5440 Lumbago with sciatica, unspecified side: Secondary | ICD-10-CM | POA: Insufficient documentation

## 2018-01-10 DIAGNOSIS — Z79899 Other long term (current) drug therapy: Secondary | ICD-10-CM | POA: Insufficient documentation

## 2018-01-10 DIAGNOSIS — M545 Low back pain: Secondary | ICD-10-CM | POA: Diagnosis present

## 2018-01-11 ENCOUNTER — Encounter (HOSPITAL_COMMUNITY): Payer: Self-pay

## 2018-01-11 ENCOUNTER — Emergency Department (HOSPITAL_COMMUNITY)
Admission: EM | Admit: 2018-01-11 | Discharge: 2018-01-11 | Disposition: A | Discharge status: Home / Self Care | Payer: 59 | Attending: Emergency Medicine | Admitting: Emergency Medicine

## 2018-01-11 DIAGNOSIS — M5440 Lumbago with sciatica, unspecified side: Secondary | ICD-10-CM

## 2018-01-11 MED ORDER — HYDROCODONE-ACETAMINOPHEN 5-325 MG PO TABS
2.0000 | ORAL_TABLET | Freq: Once | ORAL | Status: AC
  Administered 2018-01-11: 2 via ORAL
  Filled 2018-01-11: qty 2

## 2018-01-11 MED ORDER — HYDROCODONE-ACETAMINOPHEN 5-325 MG PO TABS: 1.0000 | ORAL_TABLET | Freq: Four times a day (QID) | ORAL | 0 refills | Status: DC | PRN

## 2018-01-11 MED ORDER — PREDNISONE 20 MG PO TABS
60.0000 mg | ORAL_TABLET | Freq: Once | ORAL | Status: AC
  Administered 2018-01-11: 60 mg via ORAL
  Filled 2018-01-11: qty 3

## 2018-01-11 MED ORDER — PREDNISONE 20 MG PO TABS: 40.0000 mg | ORAL_TABLET | Freq: Every day | ORAL | 0 refills | Status: DC

## 2018-01-11 MED ORDER — CYCLOBENZAPRINE HCL 10 MG PO TABS: 10.0000 mg | ORAL_TABLET | Freq: Two times a day (BID) | ORAL | 0 refills | Status: DC | PRN

## 2018-01-11 MED ORDER — CYCLOBENZAPRINE HCL 10 MG PO TABS
10.0000 mg | ORAL_TABLET | Freq: Once | ORAL | Status: AC
  Administered 2018-01-11: 10 mg via ORAL
  Filled 2018-01-11: qty 1

## 2018-01-11 NOTE — ED Provider Notes (Signed)
[facility] Provider Note   CSN: [id number] Arrival date & time: 01/10/18  2310     History   Chief Complaint Chief Complaint  Patient presents with  . Back Pain    HPI Kristin [name] is a 32 y.o. female.  Patient presents to the emergency department with a chief complaint of low back pain.  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  She reports that she gets spinal injections for this.  She states that today she was at an [employer] and was walking up a hill and believes that this exacerbated her pain.  She is scheduled to start physical therapy on Tuesday.  She is followed by Dr. Katrinka Blazing from sports medicine.  She states that she has had pain that radiates down the back of her leg and into her toes.  She denies any bowel or bladder incontinence.  Denies any numbness.  Denies any falls.  Denies any fevers or chills.  The history is provided by the patient. No language interpreter was used.    Past Medical History:  Diagnosis Date  . History of kidney stones   . Kidney stone   . Kidney stones    Kidney Stones  . Knee joint pain   . Pregnancy induced hypertension   . Syncope     Patient Active Problem List   Diagnosis Date Noted  . Piriformis syndrome of left side 12/30/2017  . Nonallopathic lesion of sacral region 07/22/2017  . Nonallopathic lesion of lumbosacral region 07/22/2017  . Nonallopathic lesion of thoracic region 07/22/2017  . 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 Vaginal delivery 04/15/2016  . BMI 40.0-44.9, adult (HCC) 04/14/2016  . Kidney stones--hx  03/24/2016  . History of pregnancy induced hypertension--prior pregnancy 03/24/2016  . Positive GBS test 03/24/2016  . LGA (large for gestational age) fetus--hx with 1st pregnancy, 9+6 03/01/2016  . Allergy to amoxicillin 03/01/2016  . 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 Syncope 08/21/2015  .  Hypokalemia 08/21/2015  . Anxiety 08/21/2015    Past Surgical History:  Procedure Laterality Date  . EXTRACORPOREAL SHOCK WAVE LITHOTRIPSY Left 01/30/2017   Procedure: LEFT EXTRACORPOREAL SHOCK WAVE LITHOTRIPSY (ESWL);  Surgeon: Malen Gauze, MD;  Location: [facility];  Service: Urology;  Laterality: Left;  . KIDNEY STONE SURGERY       OB History    Gravida  4   Para  2   Term  2   Preterm  0   AB  2   Living  2     SAB  2   TAB      Ectopic      Multiple  0   Live Births  1            Home Medications    Prior to Admission medications   Medication Sig Start Date End Date Taking? Authorizing Provider  Ascorbic Acid (VITAMIN C) 500 MG CAPS Take 500 mg by mouth daily.    [provider]  cyclobenzaprine (FLEXERIL) 10 MG tablet Take 1 tablet (10 mg total) by mouth 2 (two) times daily as needed for muscle spasms. 05/04/17   Maxwell Caul, PA-C  diclofenac sodium (VOLTAREN) 1 % GEL Apply 2 g topically 4 (four) times daily. 12/31/16   Gwyneth Sprout, MD  gabapentin (NEURONTIN) 100 MG capsule Take 2 capsules (200 mg total) by mouth at bedtime. 08/18/17  Judi SaaSmith, Zachary M, DO  HYDROcodone-acetaminophen (NORCO/VICODIN) 5-325 MG tablet Take 1-2 tablets by mouth every 6 (six) hours as needed. 05/04/17   Maxwell CaulLayden, Lindsey A, PA-C  Misc Natural Products (TART CHERRY ADVANCED PO) Take 3 tablets by mouth daily.    [provider]  Multiple Vitamins-Minerals (MULTIVITAMIN ADULT PO) Take 1 tablet by mouth daily.    [provider]  ondansetron (ZOFRAN ODT) 4 MG disintegrating tablet Take 1 tablet (4 mg total) by mouth every 8 (eight) hours as needed for nausea or vomiting. 01/15/17   Alvira MondaySchlossman, Erin, MD  Turmeric 500 MG CAPS Take 500 mg by mouth daily.    [provider]  venlafaxine XR (EFFEXOR XR) 150 MG 24 hr capsule Take 1 capsule (150 mg total) by mouth daily with breakfast. 01/08/18   Judi SaaSmith, Zachary M, DO  Vitamin D, Ergocalciferol,  (DRISDOL) 50000 units CAPS capsule TAKE 1 CAPSULE (50,000 UNITS TOTAL) BY MOUTH EVERY 7 (SEVEN) DAYS. 08/25/17   Judi SaaSmith, Zachary M, DO    Family History Family History  Problem Relation Age of Onset  . CVA Mother   . Hypertension Mother   . Diabetes Mellitus II Mother   . Congestive Heart Failure Mother   . CVA Father   . Hypertension Father   . Diabetes Mellitus II Father     Social History Social History   Tobacco Use  . Smoking status: Never Smoker  . Smokeless tobacco: Never Used  Substance Use Topics  . Alcohol use: No  . Drug use: No     Allergies   Amoxicillin   Review of Systems Review of Systems  All other systems reviewed and are negative.    Physical Exam Updated Vital Signs BP (!) 174/119 (BP Location: Right Arm)   Pulse 73   Temp 97.9 F (36.6 C) (Oral)   Resp 18   Ht 5\' 5"  (1.651 m)   Wt 110.7 kg (244 lb)   LMP 12/17/2017 (Exact Date)   SpO2 99%   BMI 40.60 kg/m   Physical Exam Physical Exam  Constitutional: Pt appears well-developed and well-nourished. No distress.  HENT:  Head: Normocephalic and atraumatic.  Mouth/Throat: Oropharynx is clear and moist. No oropharyngeal exudate.  Eyes: Conjunctivae are normal.  Neck: Normal range of motion. Neck supple.  No meningismus Cardiovascular: Normal rate, regular rhythm and intact distal pulses.   Pulmonary/Chest: Effort normal and breath sounds normal. No respiratory distress. Pt has no wheezes.  Abdominal: Pt exhibits no distension Musculoskeletal:  Lumbar paraspinal muscles tender to palpation, no bony CTLS spine tenderness, deformity, step-off, or crepitus Lymphadenopathy: Pt has no cervical adenopathy.  Neurological: Pt is alert and oriented Speech is clear and goal oriented, follows commands Normal 5/5 strength in upper and lower extremities bilaterally including dorsiflexion and plantar flexion, strong and equal grip strength Sensation intact Great toe extension intact Moves  extremities without ataxia, coordination intact Normal gait Normal balance No Clonus Skin: Skin is warm and dry. No rash noted. Pt is not diaphoretic. No erythema.  Psychiatric: Pt has a normal mood and affect. Behavior is normal.  Nursing note and vitals reviewed.   ED Treatments / Results  Labs (all labs ordered are listed, but only abnormal results are displayed) Labs Reviewed - No data to display  EKG None  Radiology No results found.  Procedures Procedures (including critical care time)  Medications Ordered in ED Medications  HYDROcodone-acetaminophen (NORCO/VICODIN) 5-325 MG per tablet 2 tablet (2 tablets Oral Given 01/11/18 0149)  cyclobenzaprine (FLEXERIL)  tablet 10 mg (10 mg Oral Given 01/11/18 0149)  predniSONE (DELTASONE) tablet 60 mg (60 mg Oral Given 01/11/18 0149)     Initial Impression / Assessment and Plan / ED Course  I have reviewed the triage vital signs and the nursing notes.  Pertinent labs & imaging results that were available during my care of the patient were reviewed by me and considered in my medical decision making (see chart for details).     Patient with back pain.    No neurological deficits and normal neuro exam.  Patient is ambulatory.  No loss of bowel or bladder control.  Doubt cauda equina.  Denies fever,  doubt epidural abscess or other lesion. Recommend back exercises, stretching, RICE, and will treat with a short course of norco, prednisone, and flexeril.   Encouraged the patient that there could be a need for additional workup and/or imaging such as MRI, if the symptoms do not resolve. Patient advised that if the back pain does not resolve, or radiates, this could progress to more serious conditions and is encouraged to follow-up with PCP or orthopedics within 2 weeks.     Final Clinical Impressions(s) / ED Diagnoses   Final diagnoses:  Acute midline low back pain with sciatica, sciatica laterality unspecified    ED Discharge  Orders        Ordered    HYDROcodone-acetaminophen (NORCO/VICODIN) 5-325 MG tablet  Every 6 hours PRN     01/11/18 0204    predniSONE (DELTASONE) 20 MG tablet  Daily     01/11/18 0204    cyclobenzaprine (FLEXERIL) 10 MG tablet  2 times daily PRN     01/11/18 0204       Roxy Horseman, PA-C 01/11/18 0205    Ward, Layla Maw, DO 01/11/18 [phone]

## 2018-01-11 NOTE — ED Triage Notes (Signed)
Pt arrived from home with complaints of lower back pain. 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 Pain is normally manageable at home started yesterday morning and gradually increased, no intolerable to put weight on both legs/sit. States when she does her left leg starts to feel numb.

## 2018-01-13 ENCOUNTER — Encounter: Payer: Self-pay | Admitting: Physical Therapy

## 2018-01-13 ENCOUNTER — Other Ambulatory Visit: Payer: Self-pay

## 2018-01-13 ENCOUNTER — Ambulatory Visit: Payer: 59 | Attending: Family Medicine | Admitting: Physical Therapy

## 2018-01-13 DIAGNOSIS — R262 Difficulty in walking, not elsewhere classified: Secondary | ICD-10-CM | POA: Diagnosis present

## 2018-01-13 DIAGNOSIS — M5442 Lumbago with sciatica, left side: Secondary | ICD-10-CM | POA: Insufficient documentation

## 2018-01-13 DIAGNOSIS — M25652 Stiffness of left hip, not elsewhere classified: Secondary | ICD-10-CM | POA: Diagnosis present

## 2018-01-13 DIAGNOSIS — G8929 Other chronic pain: Secondary | ICD-10-CM | POA: Insufficient documentation

## 2018-01-13 DIAGNOSIS — M6281 Muscle weakness (generalized): Secondary | ICD-10-CM | POA: Diagnosis present

## 2018-01-14 NOTE — Therapy (Signed)
[facility] [address] [place], [place], [address] Phone: [phone]   Fax:  [phone]  Physical Therapy Evaluation  Patient Details  Name: Kristin GlasgowLauren G [name] MRN: [id number] Date of Birth: 06/23/1986 Referring Provider: Dr Terrilee FilesZach Smith    Encounter Date: 01/13/2018  PT End of Session - 01/13/18 0859    Visit Number  1    Number of Visits  16    Date for PT Re-Evaluation  03/10/18    Authorization Type  [employer]etna No Ionto without EOB     PT Start Time  0802    PT Stop Time  0845    PT Time Calculation (min)  43 min    Activity Tolerance  Patient tolerated treatment well    Behavior During Therapy  [facility] for tasks assessed/performed       Past Medical History:  Diagnosis Date  . History of kidney stones   . Kidney stone   . Kidney stones    Kidney Stones  . Knee joint pain   . Pregnancy induced hypertension   . Syncope     Past Surgical History:  Procedure Laterality Date  . EXTRACORPOREAL SHOCK WAVE LITHOTRIPSY Left 01/30/2017   Procedure: LEFT EXTRACORPOREAL SHOCK WAVE LITHOTRIPSY (ESWL);  Surgeon: Malen GauzeMcKenzie, Patrick L, MD;  Location: [facility];  Service: Urology;  Laterality: Left;  . KIDNEY STONE SURGERY      There were no vitals filed for this visit.   Subjective Assessment - 01/13/18 0807    Subjective  Patient reports that last year she bvegan to have lower back pain. 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 The pain has increased to the point were she had to go to the hospital last week. The pain will go all the way down to her foot. She feels like at times she can not put weight into her foot.     Pertinent History  anxiety; kidney stones,     Diagnostic tests  Lumbar CT: bilateral pars deffect    Patient Stated Goals  to have less pain in her back.     Currently in Pain?  Yes    Pain Score  7     Pain Location  Back    Pain Orientation  Left    Pain Descriptors / Indicators  Constant    Pain Type   Chronic pain    Pain Radiating Towards  into left leg into the foot     Pain Onset  More than a month ago    Pain Frequency  Constant    Aggravating Factors   Standing for long peridods of time and sitting     Pain Relieving Factors  shifting her weight; sleeping on her stomach; adjusting her hips when she sleeps.     Effect of Pain on Daily Activities  difficulty perfroming work and household tasks          [facility] - 01/14/18 0001      Assessment   Medical Diagnosis  Low Back Pain on the right     Referring Provider  Dr Terrilee FilesZach Smith     Onset Date/Surgical Date  -- 1 year prior     Hand Dominance  Right    Next MD Visit  May 1st    Prior Therapy  PT but not for her back       Precautions   Precautions  None      Restrictions   Weight Bearing  Restrictions  No      Balance Screen   Has the patient fallen in the past 6 months  No    Has the patient had a decrease in activity level because of a fear of falling?   No    Is the patient reluctant to leave their home because of a fear of falling?   No      Home Environment   Additional Comments  Stairs going into the house.       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       Cognition   Overall Cognitive Status  Within Functional Limits for tasks assessed    Attention  Focused    Focused Attention  Appears intact    Memory  Appears intact    Awareness  Appears intact    Problem Solving  Appears intact      Observation/Other Assessments   Observations  leanse to the right in her chair       Sensation   Additional Comments  Numbness and tingling in the left foot; radiating pain into the left leg       Coordination   Gross Motor Movements are Fluid and Coordinated  Yes    Fine Motor Movements are Fluid and Coordinated  Yes      Posture/Postural Control   Posture Comments  Rounded shoulders; sits  with a flexed trunk       AROM   Overall AROM Comments  all lumar moevemnts cause pain extension > flexion left rotation > right     Lumbar Flexion  40    Lumbar Extension  20    Lumbar - Left Side Bend  pain to the left     Lumbar - Right Rotation  limited 50% with pain     Lumbar - Left Rotation  limited 75% with more pain       PROM   Overall PROM Comments  Pain with passive hip IR; Left hip flexion to 85 with pain       Strength   Right Hip Flexion  5/5    Right Hip ABduction  5/5    Right Hip ADduction  5/5    Left Hip Flexion  3+/5    Left Hip ABduction  4/5    Left Hip ADduction  4+/5    Left Knee Flexion  4/5    Left Knee Extension  4/5    Left Ankle Dorsiflexion  3+/5      Palpation   SI assessment   left hip elevation     Palpation comment  singificant spasming of the lower lumbar spine around L4/L5 and into the upper gluteals       Ambulation/Gait   Gait Comments  increased left leg abduction                 Objective measurements completed on examination: See above findings.              PT Education - 01/13/18 0819    Education provided  Yes    Education Details  HEP;     Person(s) Educated  Patient    Methods  Explanation;Demonstration;Tactile cues;Verbal cues    Comprehension  Verbalized understanding;Returned demonstration;Tactile cues required;Verbal cues required       PT Short Term Goals - 01/13/18 1610  PT SHORT TERM GOAL #1   Title  Patient will demsotrate a good core contraction     Time  4    Period  Weeks    Status  New    Target Date  02/10/18      PT SHORT TERM GOAL #2   Title  Patient will increase passive left hip flexion to 110 degrees without pain     Time  4    Period  Weeks    Status  New    Target Date  02/10/18      PT SHORT TERM GOAL #3   Title  Patient will be independent with basic HEP for core strengthening     Time  4    Period  Weeks    Status  New    Target Date  02/10/18      PT SHORT  TERM GOAL #4   Title  Patient will increase gross left lower extremity strength to 4+/5     Time  4    Period  Weeks    Status  New    Target Date  02/10/18        PT Long Term Goals - 01/13/18 0911      PT LONG TERM GOAL #1   Title  Patient will ambualte 1000' without increased pain with inproved left hip flexion and decreased left hip abduction inorder to perfrom work tasks     Time  8    Period  Weeks    Status  New    Target Date  03/10/18      PT LONG TERM GOAL #2   Title  Patient will demsotrate a 43% limiation on FOTO     Time  4    Period  Weeks    Target Date  03/10/18      PT LONG TERM GOAL #3   Title  Patiewnt will pick 25 lb weight with proper breathing and technqiue without pain in order to perfform work tasks     Time  4    Period  Weeks    Status  New    Target Date  03/10/18             Plan - 01/13/18 0900    Clinical Impression Statement  Patient is a 32 year old female with lower back. She has a bilateral pars defect per imaging. Her pain radiates down her left leg all the way to her foot. She has increased pain with standing and when sitting on the floor. She would benefit from skilled therapy to increase her core stability and improve her ability to stand at work. She was seen for a low complexity evaluation.      History and Personal Factors relevant to plan of care:  Obesity     Clinical Presentation  Stable    Clinical Decision Making  Low    Rehab Potential  Good    PT Frequency  2x / week    PT Duration  8 weeks    PT Treatment/Interventions  ADLs/Self Care Home Management;Cryotherapy;Electrical Stimulation;Moist Heat;Ultrasound;Gait training;Stair training;Functional mobility training;Therapeutic activities;Therapeutic exercise;Neuromuscular re-education;Patient/family education;Manual techniques;Passive range of motion;Dry needling;Splinting;Taping    PT Next Visit Plan  begin light core strengthening; consider TPDN; consider manual therapy  to reduce inflammation; patient has difficulty lying on her back; can lie on her stomach. consider supine march; cosider standing extension with abdominal breathing; consider calm shell with abdominal breathing.     PT Home  Exercise Plan  seated hamstring stretch; seated glut stretch; tennis ball trigger point release; abdominal breathing    Consulted and Agree with Plan of Care  Patient       Patient will benefit from skilled therapeutic intervention in order to improve the following deficits and impairments:  Pain, Abnormal gait, Decreased activity tolerance, Decreased endurance, Decreased range of motion, Decreased strength, Difficulty walking, Postural dysfunction  Visit Diagnosis: Chronic left-sided low back pain with left-sided sciatica  Difficulty in walking, not elsewhere classified  Muscle weakness (generalized)  Stiffness of left hip, not elsewhere classified     Problem List Patient Active Problem List   Diagnosis Date Noted  . Piriformis syndrome of left side 12/30/2017  . Nonallopathic lesion of sacral region 07/22/2017  . Nonallopathic lesion of lumbosacral region 07/22/2017  . Nonallopathic lesion of thoracic region 07/22/2017  . 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 Vaginal delivery 04/15/2016  . BMI 40.0-44.9, adult (HCC) 04/14/2016  . Kidney stones--hx  03/24/2016  . History of pregnancy induced hypertension--prior pregnancy 03/24/2016  . Positive GBS test 03/24/2016  . LGA (large for gestational age) fetus--hx with 1st pregnancy, 9+6 03/01/2016  . Allergy to amoxicillin 03/01/2016  . 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 Syncope 08/21/2015  . Hypokalemia 08/21/2015  . Anxiety 08/21/2015    Dessie Coma PT DPT  01/14/2018, 1:01 PM     [facility] [address] [place], [place], [address] Phone: [phone]   Fax:  [phone]  Name: BRITANI BEATTIE MRN:  [id number] Date of Birth: [birth year]

## 2018-01-20 ENCOUNTER — Encounter: Payer: Self-pay | Admitting: Physical Therapy

## 2018-01-20 ENCOUNTER — Ambulatory Visit: Payer: 59 | Admitting: Physical Therapy

## 2018-01-20 DIAGNOSIS — G8929 Other chronic pain: Secondary | ICD-10-CM

## 2018-01-20 DIAGNOSIS — M5442 Lumbago with sciatica, left side: Secondary | ICD-10-CM | POA: Diagnosis not present

## 2018-01-20 DIAGNOSIS — M6281 Muscle weakness (generalized): Secondary | ICD-10-CM

## 2018-01-20 DIAGNOSIS — R262 Difficulty in walking, not elsewhere classified: Secondary | ICD-10-CM

## 2018-01-20 DIAGNOSIS — M25652 Stiffness of left hip, not elsewhere classified: Secondary | ICD-10-CM

## 2018-01-20 NOTE — Patient Instructions (Signed)
Backward Bend (Standing)   Arch backward to make hollow of back deeper. Hold __10__ seconds. Repeat __5__ times per set. Do ___2_ sets per session. Do ___2_ sessions per day.  Abduction: Clam (Eccentric) - Side-Lying    Lie on side with knees bent. Lift top knee, keeping feet together. Keep trunk steady. Slowly lower for 3-5 seconds. _10__ reps per set, 2___ sets per day, __7_ days per week.

## 2018-01-20 NOTE — Therapy (Signed)
[facility] [address] [place], [place], [address] Phone: [phone]   Fax:  [phone]  Physical Therapy Treatment  Patient Details  Name: Kristin [name] MRN: [id number] Date of Birth: Feb 04, 1986 Referring Provider: Dr Terrilee Files    Encounter Date: 01/20/2018  PT End of Session - 01/20/18 1153    Visit Number  2    Number of Visits  16    Date for PT Re-Evaluation  03/10/18    Authorization Type  Aetna No Ionto without EOB     PT Start Time  1144    PT Stop Time  1230    PT Time Calculation (min)  46 min       Past Medical History:  Diagnosis Date  . History of kidney stones   . Kidney stone   . Kidney stones    Kidney Stones  . Knee joint pain   . Pregnancy induced hypertension   . Syncope     Past Surgical History:  Procedure Laterality Date  . EXTRACORPOREAL SHOCK WAVE LITHOTRIPSY Left 01/30/2017   Procedure: LEFT EXTRACORPOREAL SHOCK WAVE LITHOTRIPSY (ESWL);  Surgeon: Malen Gauze, MD;  Location: [facility];  Service: Urology;  Laterality: Left;  . KIDNEY STONE SURGERY      There were no vitals filed for this visit.  Subjective Assessment - 01/20/18 1150    Subjective  I've been using the tennis ball. More walking, better with rest breaks.     Currently in Pain?  Yes    Pain Score  3     Pain Location  Back    Pain Orientation  Left    Pain Descriptors / Indicators  Sore;Constant;Throbbing    Aggravating Factors   standing  and sitting prolonged     Pain Relieving Factors  sleeping on stomach, shifting weight                        [facility] Adult PT Treatment/Exercise - 01/20/18 0001      Lumbar Exercises: Stretches   Active Hamstring Stretch  3 reps;30 seconds    Active Hamstring Stretch Limitations  seated EOM    Piriformis Stretch  2 reps;30 seconds    Piriformis Stretch Limitations  supine and seated    Figure 4 Stretch  2 reps;30 seconds    Figure 4 Stretch Limitations   seated       Lumbar Exercises: Seated   Other Seated Lumbar Exercises  Seated anterior and posterior pelvic tocking, finding neutral spine       Lumbar Exercises: Sidelying   Clam  Both;10 reps;5 seconds    Clam Limitations  cues for abdominal draw in       Lumbar Exercises: Prone   Other Prone Lumbar Exercises  Prone gluteal/heel squeeze 5 sec x 10       Modalities   Modalities  Moist Heat      Moist Heat Therapy   Number Minutes Moist Heat  8 Minutes    Moist Heat Location  Lumbar Spine      Manual Therapy   Manual therapy comments  Prone Soft tissue work to right gluteals/piriformis              PT Education - 01/20/18 1219    Education provided  Yes    Education Details  HEP    Person(s) Educated  Patient    Methods  Explanation;Handout    Comprehension  Verbalized understanding  PT Short Term Goals - 01/13/18 0910      PT SHORT TERM GOAL #1   Title  Patient will demsotrate a good core contraction     Time  4    Period  Weeks    Status  New    Target Date  02/10/18      PT SHORT TERM GOAL #2   Title  Patient will increase passive left hip flexion to 110 degrees without pain     Time  4    Period  Weeks    Status  New    Target Date  02/10/18      PT SHORT TERM GOAL #3   Title  Patient will be independent with basic HEP for core strengthening     Time  4    Period  Weeks    Status  New    Target Date  02/10/18      PT SHORT TERM GOAL #4   Title  Patient will increase gross left lower extremity strength to 4+/5     Time  4    Period  Weeks    Status  New    Target Date  02/10/18        PT Long Term Goals - 01/13/18 0911      PT LONG TERM GOAL #1   Title  Patient will ambualte 1000' without increased pain with inproved left hip flexion and decreased left hip abduction inorder to perfrom work tasks     Time  8    Period  Weeks    Status  New    Target Date  03/10/18      PT LONG TERM GOAL #2   Title  Patient will demsotrate a 43%  limiation on FOTO     Time  4    Period  Weeks    Target Date  03/10/18      PT LONG TERM GOAL #3   Title  Patiewnt will pick 25 lb weight with proper breathing and technqiue without pain in order to perfform work tasks     Time  4    Period  Weeks    Status  New    Target Date  03/10/18            Plan - 01/20/18 1300    Clinical Impression Statement  Pt reports 3/10pain from sitting at work today. She is using a cushion to sit on at work. Discussed posture and she returned demo of proper sitting posture with feet flat on the floor. She feels her seat is too high at work. Her pain is right gluteal area today and soft tissue work was performed. We reviewed her HEP and added clams and standing lumbar extension. Pain closely monitired throughout treatment. She felt comfort with prone over 2 pillow positioning.     PT Next Visit Plan  begin light core strengthening; consider TPDN; consider manual therapy to reduce inflammation; patient has difficulty lying on her back; can lie on her stomach. consider supine march; cosider standing extension with abdominal breathing; consider calm shell with abdominal breathing.     PT Home Exercise Plan  seated hamstring stretch; seated glut stretch; tennis ball trigger point release; abdominal breathing, side clam, standing lumbar extensions     Consulted and Agree with Plan of Care  Patient       Patient will benefit from skilled therapeutic intervention in order to improve the following deficits and impairments:  Pain, Abnormal  gait, Decreased activity tolerance, Decreased endurance, Decreased range of motion, Decreased strength, Difficulty walking, Postural dysfunction  Visit Diagnosis: Chronic left-sided low back pain with left-sided sciatica  Difficulty in walking, not elsewhere classified  Muscle weakness (generalized)  Stiffness of left hip, not elsewhere classified     Problem List Patient Active Problem List   Diagnosis Date Noted   . Piriformis syndrome of left side 12/30/2017  . Nonallopathic lesion of sacral region 07/22/2017  . Nonallopathic lesion of lumbosacral region 07/22/2017  . Nonallopathic lesion of thoracic region 07/22/2017  . 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 Vaginal delivery 04/15/2016  . BMI 40.0-44.9, adult (HCC) 04/14/2016  . Kidney stones--hx  03/24/2016  . History of pregnancy induced hypertension--prior pregnancy 03/24/2016  . Positive GBS test 03/24/2016  . LGA (large for gestational age) fetus--hx with 1st pregnancy, 9+6 03/01/2016  . Allergy to amoxicillin 03/01/2016  . 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 Syncope 08/21/2015  . Hypokalemia 08/21/2015  . Anxiety 08/21/2015    Kristin [name], PTA 01/20/2018, 1:14 PM  [facility] [address] [place], [place], [address] Phone: [phone]   Fax:  [phone]  Name: Kristin [name] MRN: [id number] Date of Birth: 1986/09/21

## 2018-01-21 ENCOUNTER — Encounter: Payer: Self-pay | Admitting: Family Medicine

## 2018-01-21 ENCOUNTER — Ambulatory Visit: Payer: 59 | Admitting: Family Medicine

## 2018-01-21 VITALS — BP 150/90 | HR 85 | Ht 65.0 in | Wt 244.0 lb

## 2018-01-21 DIAGNOSIS — M4306 Spondylolysis, lumbar region: Secondary | ICD-10-CM | POA: Diagnosis not present

## 2018-01-21 DIAGNOSIS — M999 Biomechanical lesion, unspecified: Secondary | ICD-10-CM

## 2018-01-21 NOTE — Progress Notes (Signed)
[name] [address] [place], [place] [address] Phone: [phone] Subjective:    I'm seeing this patient by the request  of:    CC: Back pain follow-up  IRW:[id number]  Kristin [name] is a 32 y.o. female coming in with complaint of back pain.  Patient does have a known pars defect at L5.  Discussed with patient in great length on posture and ergonomics.  Patient has had 3 different injections in the back in the last one caused worsening pain.  Patient did have an exacerbation since last visit with to the emergency room.  Patient states though that since then has been doing relatively well.  Has been in formal physical therapy 2 more times.  Patient has been attempting to work on a more regular basis.  Feels like she is making some progress.     Past Medical History:  Diagnosis Date  . History of kidney stones   . Kidney stone   . Kidney stones    Kidney Stones  . Knee joint pain   . Pregnancy induced hypertension   . Syncope    Past Surgical History:  Procedure Laterality Date  . EXTRACORPOREAL SHOCK WAVE LITHOTRIPSY Left 01/30/2017   Procedure: LEFT EXTRACORPOREAL SHOCK WAVE LITHOTRIPSY (ESWL);  Surgeon: Malen Gauze, MD;  Location: [facility];  Service: Urology;  Laterality: Left;  . KIDNEY STONE SURGERY     Social History   Socioeconomic History  . Marital status: Married    Spouse name: Not on file  . Number of children: Not on file  . Years of education: Not on file  . Highest education level: Not on file  Occupational History  . Not on file  Social Needs  . Financial resource strain: Not on file  . Food insecurity:    Worry: Not on file    Inability: Not on file  . Transportation needs:    Medical: Not on file    Non-medical: Not on file  Tobacco Use  . Smoking status: Never Smoker  . Smokeless tobacco: Never Used  Substance and Sexual Activity  . Alcohol use: No  . Drug use: No  . Sexual activity: Not on file    Lifestyle  . Physical activity:    Days per week: Not on file    Minutes per session: Not on file  . Stress: Not on file  Relationships  . Social connections:    Talks on phone: Not on file    Gets together: Not on file    Attends religious service: Not on file    Active member of club or organization: Not on file    Attends meetings of clubs or organizations: Not on file    Relationship status: Not on file  Other Topics Concern  . Not on file  Social History Narrative  . Not on file   Allergies  Allergen Reactions  . Amoxicillin Hives    Has patient had a PCN reaction causing immediate rash, facial/tongue/throat swelling, SOB or lightheadedness with hypotension: no Has patient had a PCN reaction causing severe rash involving mucus membranes or skin necrosis: No Has patient had a PCN reaction that required hospitalization No Has patient had a PCN reaction occurring within the last 10 years: No If all of the above answers are "NO", then may proceed with Cephalosporin use.   Family History  Problem Relation Age of Onset  . CVA Mother   . Hypertension Mother   .  Diabetes Mellitus II Mother   . Congestive Heart Failure Mother   . CVA Father   . Hypertension Father   . Diabetes Mellitus II Father      Past medical history, social, surgical and family history all reviewed in electronic medical record.  No pertanent information unless stated regarding to the chief complaint.   Review of Systems:Review of systems updated and as accurate as of 01/21/18  No headache, visual changes, nausea, vomiting, diarrhea, constipation, dizziness, abdominal pain, skin rash, fevers, chills, night sweats, weight loss, swollen lymph nodes, body aches, joint swelling,  chest pain, shortness of breath, mood changes.  Positive muscle aches  Objective  Blood pressure (!) 150/90, pulse 85, height  (1.651 m), weight 244 lb (110.7 kg), SpO2 97 %, currently breastfeeding. Systems examined below as  of 01/21/18   General: No apparent distress alert and oriented x3 mood and affect normal, dressed appropriately.  HEENT: Pupils equal, extraocular movements intact  Respiratory: Patient's speak in full sentences and does not appear short of breath  Cardiovascular: No lower extremity edema, non tender, no erythema  Skin: Warm dry intact with no signs of infection or rash on extremities or on axial skeleton.  Abdomen: Soft nontender  Neuro: Cranial nerves II through XII are intact, neurovascularly intact in all extremities with 2+ DTRs and 2+ pulses.  Lymph: No lymphadenopathy of posterior or anterior cervical chain or axillae bilaterally.  Gait normal with good balance and coordination.  MSK:  Non tender with full range of motion and good stability and symmetric strength and tone of shoulders, elbows, wrist, hip, knee and ankles bilaterally.   Back Exam:  Inspection: Mild loss of lordosis Motion: Flexion 45 deg, Extension 15 deg, Side Bending to 45 deg bilaterally,  Rotation to 45 deg bilaterally  SLR laying: Negative  XSLR laying: Negative  Palpable tenderness: Tender to palpation the paraspinal musculature lumbar spine right greater than left. FABER: Mild tightness of the right. Sensory change: Gross sensation intact to all lumbar and sacral dermatomes.  Reflexes: 2+ at both patellar tendons, 2+ at achilles tendons, Babinski's downgoing.  Strength at foot  Plantar-flexion: 5/5 Dorsi-flexion: 5/5 Eversion: 5/5 Inversion: 5/5  Leg strength  Quad: 5/5 Hamstring: 5/5 Hip flexor: 5/5 Hip abductors: 5/5  Gait unremarkable.  Osteopathic findings  T9 extended rotated and side bent left L2 flexed rotated and side bent right Sacrum right on right     Impression and Recommendations:     This case required medical decision making of moderate complexity.      Note: This dictation was prepared with Dragon dictation along with smaller phrase technology. Any transcriptional errors that  result from this process are unintentional.

## 2018-01-21 NOTE — Assessment & Plan Note (Addendum)
Decision today to treat with OMT was based on Physical Exam  After verbal consent patient was treated with HVLA, ME, FPR techniques in  thoracic, lumbar and sacral areas  Patient tolerated the procedure well with improvement in symptoms  Patient given exercises, stretches and lifestyle modifications  See medications in patient instructions if given  Patient will follow up in 4-8 weeks 

## 2018-01-21 NOTE — Patient Instructions (Signed)
Good to see you  Kristin [name] is your friend.  Keep the same meds Try dry needling at the next appointment.  We may want to check some labs next time if needed.  Stay active See me again in 4 weeks

## 2018-01-21 NOTE — Assessment & Plan Note (Signed)
Still likely contributing to most of the pain.  The pars defect does not show any significant spinal stenosis noted on the imaging.  This was independently visualized by me again today.  We discussed icing regimen and home exercises.  Discussed which activities doing which wants to avoid.  Patient is to increase activity as tolerated.  Patient encouraged to potentially try to lose weight we discussed other medications.  Follow-up again 4 to 8 weeks

## 2018-01-23 ENCOUNTER — Ambulatory Visit: Payer: 59 | Attending: Family Medicine | Admitting: Physical Therapy

## 2018-01-23 ENCOUNTER — Encounter: Payer: Self-pay | Admitting: Physical Therapy

## 2018-01-23 DIAGNOSIS — M25652 Stiffness of left hip, not elsewhere classified: Secondary | ICD-10-CM | POA: Insufficient documentation

## 2018-01-23 DIAGNOSIS — R262 Difficulty in walking, not elsewhere classified: Secondary | ICD-10-CM | POA: Diagnosis present

## 2018-01-23 DIAGNOSIS — M6281 Muscle weakness (generalized): Secondary | ICD-10-CM | POA: Insufficient documentation

## 2018-01-23 DIAGNOSIS — G8929 Other chronic pain: Secondary | ICD-10-CM | POA: Diagnosis present

## 2018-01-23 DIAGNOSIS — M5442 Lumbago with sciatica, left side: Secondary | ICD-10-CM | POA: Insufficient documentation

## 2018-01-23 NOTE — Patient Instructions (Signed)
Use green band for clam shells issued previously

## 2018-01-23 NOTE — Therapy (Signed)
[place], [place], [address] Phone: [phone]   Fax:  [phone]  Physical Therapy Treatment  Patient Details  Name: Kristin [name] MRN: [id number] Date of Birth: 1986/03/17 Referring Provider: Dr Charlann Boxer    Encounter Date: 01/23/2018  PT End of Session - 01/23/18 0818    Visit Number  3    Number of Visits  16    Date for PT Re-Evaluation  03/10/18    Authorization Type  [employer] No [place] without EOB     PT Start Time  0805    PT Stop Time  1749    PT Time Calculation (min)  50 min       Past Medical History:  Diagnosis Date  . History of kidney stones   . Kidney stone   . Kidney stones    Kidney Stones  . Knee joint pain   . Pregnancy induced hypertension   . Syncope     Past Surgical History:  Procedure Laterality Date  . EXTRACORPOREAL SHOCK WAVE LITHOTRIPSY Left 01/30/2017   Procedure: LEFT EXTRACORPOREAL SHOCK WAVE LITHOTRIPSY (ESWL);  Surgeon: Cleon Gustin, MD;  Location: [facility];  Service: Urology;  Laterality: Left;  . KIDNEY STONE SURGERY      There were no vitals filed for this visit.  Subjective Assessment - 01/23/18 0816    Subjective  I am doing HEP. My Doctor recommended Dry Needling. I feel like I am getting better. Pain not constant.     Currently in Pain?  Yes    Pain Score  2     Pain Location  Back    Pain Orientation  Left;Lower    Pain Frequency  Intermittent         [facility] PT Assessment - 01/23/18 0001      PROM   Overall PROM Comments  Pain with passive hip IR; Left hip flexion to 130 without pain                    [facility] Adult PT Treatment/Exercise - 01/23/18 0001      Lumbar Exercises: Stretches   Active Hamstring Stretch  3 reps;30 seconds    Piriformis Stretch  2 reps;30 seconds    Piriformis Stretch Limitations  supine    Figure 4 Stretch  2 reps;30 seconds    Figure 4 Stretch Limitations  supine vs seated       Lumbar  Exercises: Supine   Clam  20 reps    Clam Limitations  green    Bent Knee Raise Limitations  into table top 10 sec x 2, then alternating toe taps from table top     Bridge  10 reps    Bridge Limitations  isometric green band hold       Lumbar Exercises: Sidelying   Clam  Both;10 reps;5 seconds green band     Clam Limitations  cues for abdominal draw in       Moist Heat Therapy   Number Minutes Moist Heat  8 Minutes    Moist Heat Location  Lumbar Spine prone      Manual Therapy   Manual therapy comments  Prone Soft tissue work to right gluteals/piriformis              PT Education - 01/23/18 0947    Education provided  Yes    Education Details  add green band to clams    Person(s) Educated  Patient  Methods  Explanation;Handout    Comprehension  Verbalized understanding       PT Short Term Goals - 01/23/18 0829      PT SHORT TERM GOAL #1   Title  Patient will demsotrate a good core contraction     Time  4    Status  On-going      PT SHORT TERM GOAL #2   Title  Patient will increase passive left hip flexion to 110 degrees without pain     Baseline  130, no pain    Time  4    Period  Weeks    Status  Achieved      PT SHORT TERM GOAL #3   Title  Patient will be independent with basic HEP for core strengthening     Time  4    Period  Weeks    Status  On-going      PT SHORT TERM GOAL #4   Title  Patient will increase gross left lower extremity strength to 4+/5     Time  4    Period  Weeks    Status  Unable to assess        PT Long Term Goals - 01/13/18 0911      PT LONG TERM GOAL #1   Title  Patient will ambualte 1000' without increased pain with inproved left hip flexion and decreased left hip abduction inorder to perfrom work tasks     Time  8    Period  Weeks    Status  New    Target Date  03/10/18      PT LONG TERM GOAL #2   Title  Patient will demsotrate a 43% limiation on FOTO     Time  4    Period  Weeks    Target Date  03/10/18       PT LONG TERM GOAL #3   Title  Patiewnt will pick 25 lb weight with proper breathing and technqiue without pain in order to perfform work tasks     Time  4    Period  Weeks    Status  New    Target Date  03/10/18            Plan - 01/23/18 0818    Clinical Impression Statement  Pt saw MD who recommended she try TPDN. She will get on the waiting list. Pt reports change of chair at work is helpful however has a pull in low back with rising from chair.  Left hip flexion PROM improved to 130 without pain. She still has pain with left IR. Began Lumbar stabilization, asked pt to avoid her current ab exercise as it was too hard (supine bilateral leg raises-knees extended) . Will issue abdominal HEP if she is doing well next visit. Also needs sit-stand. STG#2 met.     PT Next Visit Plan  establish core HEP-bridge, table top with taps, TPDN when schedule allows, soft tissue work, lying on back becoming more comfortable , does well on stomach.     PT Home Exercise Plan  seated hamstring stretch; seated glut stretch; tennis ball trigger point release; abdominal breathing, side clam green band , standing lumbar extensions     Consulted and Agree with Plan of Care  Patient       Patient will benefit from skilled therapeutic intervention in order to improve the following deficits and impairments:  Pain, Abnormal gait, Decreased activity tolerance, Decreased endurance, Decreased range of motion,  Decreased strength, Difficulty walking, Postural dysfunction  Visit Diagnosis: Chronic left-sided low back pain with left-sided sciatica  Difficulty in walking, not elsewhere classified  Muscle weakness (generalized)  Stiffness of left hip, not elsewhere classified     Problem List Patient Active Problem List   Diagnosis Date Noted  . Piriformis syndrome of left side 12/30/2017  . Nonallopathic lesion of sacral region 07/22/2017  . Nonallopathic lesion of lumbosacral region 07/22/2017  .  Nonallopathic lesion of thoracic region 07/22/2017  . 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 Vaginal delivery 04/15/2016  . BMI 40.0-44.9, adult ([place]) 04/14/2016  . Kidney stones--hx  03/24/2016  . History of pregnancy induced hypertension--prior pregnancy 03/24/2016  . Positive GBS test 03/24/2016  . LGA (large for gestational age) fetus--hx with 1st pregnancy, 9+6 03/01/2016  . Allergy to amoxicillin 03/01/2016  . 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 Syncope 08/21/2015  . Hypokalemia 08/21/2015  . Anxiety 08/21/2015    Dorene Ar, PTA 01/23/2018, 9:57 AM  [facility] [address] [place], [place], [address] Phone: [phone]   Fax:  [phone]  Name: Kristin [name] MRN: [id number] Date of Birth: 10-06-1985

## 2018-01-26 ENCOUNTER — Encounter: Payer: Self-pay | Admitting: Physical Therapy

## 2018-01-26 ENCOUNTER — Ambulatory Visit: Payer: 59 | Admitting: Physical Therapy

## 2018-01-26 DIAGNOSIS — R262 Difficulty in walking, not elsewhere classified: Secondary | ICD-10-CM

## 2018-01-26 DIAGNOSIS — M25652 Stiffness of left hip, not elsewhere classified: Secondary | ICD-10-CM

## 2018-01-26 DIAGNOSIS — M5442 Lumbago with sciatica, left side: Principal | ICD-10-CM

## 2018-01-26 DIAGNOSIS — G8929 Other chronic pain: Secondary | ICD-10-CM

## 2018-01-26 DIAGNOSIS — M6281 Muscle weakness (generalized): Secondary | ICD-10-CM

## 2018-01-26 NOTE — Therapy (Addendum)
[facility] [address] [place], [place], [address] Phone: [phone]   Fax:  [phone]  Physical Therapy Treatment  Patient Details  Name: Kristin [name] MRN: [id number] Date of Birth: 01-19-1986 Referring Provider: Dr Terrilee Files    Encounter Date: 01/26/2018  PT End of Session - 01/26/18 0811    Visit Number  4    Number of Visits  16    Date for PT Re-Evaluation  03/10/18    Authorization Type  [employer]na No [place] without EOB     PT Start Time  0803    PT Stop Time  0851    PT Time Calculation (min)  48 min       Past Medical History:  Diagnosis Date  . History of kidney stones   . Kidney stone   . Kidney stones    Kidney Stones  . Knee joint pain   . Pregnancy induced hypertension   . Syncope     Past Surgical History:  Procedure Laterality Date  . EXTRACORPOREAL SHOCK WAVE LITHOTRIPSY Left 01/30/2017   Procedure: LEFT EXTRACORPOREAL SHOCK WAVE LITHOTRIPSY (ESWL);  Surgeon: Malen Gauze, MD;  Location: [facility];  Service: Urology;  Laterality: Left;  . KIDNEY STONE SURGERY      There were no vitals filed for this visit.  Subjective Assessment - 01/26/18 0810    Subjective  It is not as uncomfortable to lay down. No pain this morning.     Currently in Pain?  No/denies                       [facility] Adult PT Treatment/Exercise - 01/26/18 0001      Lumbar Exercises: Stretches   Active Hamstring Stretch  3 reps;30 seconds    Piriformis Stretch  2 reps;30 seconds    Figure 4 Stretch  2 reps;30 seconds      Lumbar Exercises: Seated   Sit to Stand  10 reps without UE       Lumbar Exercises: Supine   Clam  20 reps    Clam Limitations  green    Bent Knee Raise Limitations  into table top 10 sec x 2, then alternating toe taps from table top , 3 bouts of 10 taps with cues for neutral spine     Bridge  10 reps    Bridge with clamshell  10 reps    Straight Leg Raise  10 reps 2 sets each       Lumbar Exercises: Sidelying   Clam  20 reps green    Clam Limitations  cues for abdominal draw in       Moist Heat Therapy   Number Minutes Moist Heat  10 Minutes    Moist Heat Location  Lumbar Spine      Manual Therapy   Manual therapy comments  --             PT Education - 01/26/18 0914    Education provided  Yes    Education Details  HEP    Person(s) Educated  Patient    Methods  Explanation;Handout    Comprehension  Verbalized understanding       PT Short Term Goals - 01/23/18 0829      PT SHORT TERM GOAL #1   Title  Patient will demsotrate a good core contraction     Time  4    Status  On-going      PT  SHORT TERM GOAL #2   Title  Patient will increase passive left hip flexion to 110 degrees without pain     Baseline  130, no pain    Time  4    Period  Weeks    Status  Achieved      PT SHORT TERM GOAL #3   Title  Patient will be independent with basic HEP for core strengthening     Time  4    Period  Weeks    Status  On-going      PT SHORT TERM GOAL #4   Title  Patient will increase gross left lower extremity strength to 4+/5     Time  4    Period  Weeks    Status  Unable to assess        PT Long Term Goals - 01/13/18 0911      PT LONG TERM GOAL #1   Title  Patient will ambualte 1000' without increased pain with inproved left hip flexion and decreased left hip abduction inorder to perfrom work tasks     Time  8    Period  Weeks    Status  New    Target Date  03/10/18      PT LONG TERM GOAL #2   Title  Patient will demsotrate a 43% limiation on FOTO     Time  4    Period  Weeks    Target Date  03/10/18      PT LONG TERM GOAL #3   Title  Patiewnt will pick 25 lb weight with proper breathing and technqiue without pain in order to perfform work tasks     Time  4    Period  Weeks    Status  New    Target Date  03/10/18            Plan - 01/26/18 0844    Clinical Impression Statement  Pt reports no pain today. Less pain with lying  supine. Progressed core strength and updated HEP. Began sit-stands however these are painful in  lumbar spine.     PT Next Visit Plan  review  HEP-, TPDN when schedule allows, soft tissue work, lying on back becoming more comfortable , does well on stomach.     PT Home Exercise Plan  seated hamstring stretch; seated glut stretch; tennis ball trigger point release; abdominal breathing, side clam green band , standing lumbar extensions , supine table top with toe taps.     Consulted and Agree with Plan of Care  Patient       Patient will benefit from skilled therapeutic intervention in order to improve the following deficits and impairments:  Pain, Abnormal gait, Decreased activity tolerance, Decreased endurance, Decreased range of motion, Decreased strength, Difficulty walking, Postural dysfunction  Visit Diagnosis: Chronic left-sided low back pain with left-sided sciatica  Difficulty in walking, not elsewhere classified  Muscle weakness (generalized)  Stiffness of left hip, not elsewhere classified     Problem List Patient Active Problem List   Diagnosis Date Noted  . Piriformis syndrome of left side 12/30/2017  . Nonallopathic lesion of sacral region 07/22/2017  . Nonallopathic lesion of lumbosacral region 07/22/2017  . Nonallopathic lesion of thoracic region 07/22/2017  . 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 Vaginal delivery 04/15/2016  . BMI 40.0-44.9, adult ([facility]) 04/14/2016  . Kidney stones--hx  03/24/2016  . History of pregnancy induced hypertension--prior pregnancy 03/24/2016  . Positive GBS test 03/24/2016  .  LGA (large for gestational age) fetus--hx with 1st pregnancy, 9+6 03/01/2016  . Allergy to amoxicillin 03/01/2016  . 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 Syncope 08/21/2015  . Hypokalemia 08/21/2015  . Anxiety 08/21/2015    Sherrie Mustache, PTA 01/26/2018, 9:14 AM  [facility] [address] [place], [place], [address] Phone: [phone]   Fax:  [phone]  Name: Kristin [name] MRN: [id number] Date of Birth: 09-Jun-1986

## 2018-01-28 ENCOUNTER — Ambulatory Visit: Payer: 59 | Admitting: Physical Therapy

## 2018-01-28 ENCOUNTER — Encounter: Payer: Self-pay | Admitting: Physical Therapy

## 2018-01-28 DIAGNOSIS — M5442 Lumbago with sciatica, left side: Secondary | ICD-10-CM | POA: Diagnosis not present

## 2018-01-28 DIAGNOSIS — M25652 Stiffness of left hip, not elsewhere classified: Secondary | ICD-10-CM

## 2018-01-28 DIAGNOSIS — M6281 Muscle weakness (generalized): Secondary | ICD-10-CM

## 2018-01-28 DIAGNOSIS — G8929 Other chronic pain: Secondary | ICD-10-CM

## 2018-01-28 DIAGNOSIS — R262 Difficulty in walking, not elsewhere classified: Secondary | ICD-10-CM

## 2018-01-28 NOTE — Therapy (Signed)
[facility] [address] [place], [place], [address] Phone: [phone]   Fax:  [phone]  Physical Therapy Treatment  Patient Details  Name: Kristin [name] MRN: [id number] Date of Birth: September 15, 1986 Referring Provider: Dr Terrilee Files    Encounter Date: 01/28/2018  PT End of Session - 01/28/18 0727    Visit Number  5    Number of Visits  16    Date for PT Re-Evaluation  03/10/18    Authorization Type  [employer] No [place] without EOB     PT Start Time  0720    PT Stop Time  0815    PT Time Calculation (min)  55 min       Past Medical History:  Diagnosis Date  . History of kidney stones   . Kidney stone   . Kidney stones    Kidney Stones  . Knee joint pain   . Pregnancy induced hypertension   . Syncope     Past Surgical History:  Procedure Laterality Date  . EXTRACORPOREAL SHOCK WAVE LITHOTRIPSY Left 01/30/2017   Procedure: LEFT EXTRACORPOREAL SHOCK WAVE LITHOTRIPSY (ESWL);  Surgeon: Malen Gauze, MD;  Location: [facility];  Service: Urology;  Laterality: Left;  . KIDNEY STONE SURGERY      There were no vitals filed for this visit.  Subjective Assessment - 01/28/18 0724    Subjective  A little bit of pain this morning. 5/10. Caught a kids from falling off of playground equipment and felt pain in my back.     Currently in Pain?  Yes    Pain Score  5     Pain Location  Back    Pain Orientation  Left;Lower    Pain Descriptors / Indicators  Sore    Aggravating Factors   catching a falling child    Pain Relieving Factors  sleeping on stomach, heat          [facility] PT Assessment - 01/28/18 0001      AROM   Lumbar - Left Side Bend  no pain     Lumbar - Right Rotation  no pain     Lumbar - Left Rotation  no pain      Strength   Left Hip Flexion  4-/5    Left Ankle Dorsiflexion  4/5                   [facility] Adult PT Treatment/Exercise - 01/28/18 0001      Lumbar Exercises: Stretches   Quadruped Mid  Back Stretch  3 reps;30 seconds with laterals       Lumbar Exercises: Aerobic   Nustep  5 min L5 5 UE/LE       Lumbar Exercises: Seated   Sit to Stand  10 reps without UE       Lumbar Exercises: Supine   Clam  20 reps    Clam Limitations  green    Bent Knee Raise Limitations  into table top 10 sec x 2, then alternating toe taps from table top , 3 bouts of 10 taps with cues for neutral spine     Bridge with clamshell  10 reps    Straight Leg Raise  10 reps 2 sets each      Lumbar Exercises: Sidelying   Clam  20 reps green    Clam Limitations  cues for abdominal draw in       Lumbar Exercises: Quadruped   [place]  10  reps    Straight Leg Raise  5 reps    Opposite Arm/Leg Raise  5 reps    Other Quadruped Lumbar Exercises  Fire Hydrants x 10 , cues for neutral       Moist Heat Therapy   Number Minutes Moist Heat  15 Minutes    Moist Heat Location  Lumbar Spine      Manual Therapy   Manual therapy comments  Prone Soft tissue work to left lumbar paraspinals                PT Short Term Goals - 01/23/18 0829      PT SHORT TERM GOAL #1   Title  Patient will demsotrate a good core contraction     Time  4    Status  On-going      PT SHORT TERM GOAL #2   Title  Patient will increase passive left hip flexion to 110 degrees without pain     Baseline  130, no pain    Time  4    Period  Weeks    Status  Achieved      PT SHORT TERM GOAL #3   Title  Patient will be independent with basic HEP for core strengthening     Time  4    Period  Weeks    Status  On-going      PT SHORT TERM GOAL #4   Title  Patient will increase gross left lower extremity strength to 4+/5     Time  4    Period  Weeks    Status  Unable to assess        PT Long Term Goals - 01/13/18 0911      PT LONG TERM GOAL #1   Title  Patient will ambualte 1000' without increased pain with inproved left hip flexion and decreased left hip abduction inorder to perfrom work tasks     Time  8     Period  Weeks    Status  New    Target Date  03/10/18      PT LONG TERM GOAL #2   Title  Patient will demsotrate a 43% limiation on FOTO     Time  4    Period  Weeks    Target Date  03/10/18      PT LONG TERM GOAL #3   Title  Patiewnt will pick 25 lb weight with proper breathing and technqiue without pain in order to perfform work tasks     Time  4    Period  Weeks    Status  New    Target Date  03/10/18            Plan - 01/28/18 0749    Clinical Impression Statement  Hip and ankle strength improving. Lumbar AROM no longer painful for sidebend and rotation. Began with manual to left lumbar paraspinals and proceeded with qudruped stretching, core/hip strength with  which she did well.     PT Next Visit Plan  review  HEP-, TPDN when schedule allows, soft tissue work, lying on back becoming more comfortable , does well on stomach/ quadruped     PT Home Exercise Plan  seated hamstring stretch; seated glut stretch; tennis ball trigger point release; abdominal breathing, side clam green band , standing lumbar extensions , supine table top with toe taps.     Consulted and Agree with Plan of Care  Patient  Patient will benefit from skilled therapeutic intervention in order to improve the following deficits and impairments:  Pain, Abnormal gait, Decreased activity tolerance, Decreased endurance, Decreased range of motion, Decreased strength, Difficulty walking, Postural dysfunction  Visit Diagnosis: Chronic left-sided low back pain with left-sided sciatica  Difficulty in walking, not elsewhere classified  Muscle weakness (generalized)  Stiffness of left hip, not elsewhere classified     Problem List Patient Active Problem List   Diagnosis Date Noted  . Piriformis syndrome of left side 12/30/2017  . Nonallopathic lesion of sacral region 07/22/2017  . Nonallopathic lesion of lumbosacral region 07/22/2017  . Nonallopathic lesion of thoracic region 07/22/2017  . Pars  defect of lumbar spine 06/04/2017  . Vaginal delivery 04/15/2016  . BMI 40.0-44.9, adult ([facility]) 04/14/2016  . Kidney stones--hx  03/24/2016  . History of pregnancy induced hypertension--prior pregnancy 03/24/2016  . Positive GBS test 03/24/2016  . LGA (large for gestational age) fetus--hx with 1st pregnancy, 9+6 03/01/2016  . Allergy to amoxicillin 03/01/2016  . 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 Syncope 08/21/2015  . Hypokalemia 08/21/2015  . Anxiety 08/21/2015    Sherrie Mustache, PTA 01/28/2018, 11:15 AM  [facility] [address] [place], [place], [address] Phone: [phone]   Fax:  [phone]  Name: DEBORHA MOSELEY MRN: [id number] Date of Birth: December 25, 1985

## 2018-01-30 ENCOUNTER — Encounter

## 2018-02-04 ENCOUNTER — Ambulatory Visit: Payer: 59 | Admitting: Physical Therapy

## 2018-02-06 ENCOUNTER — Ambulatory Visit: Payer: 59 | Admitting: Physical Therapy

## 2018-02-06 ENCOUNTER — Encounter: Payer: Self-pay | Admitting: Physical Therapy

## 2018-02-06 DIAGNOSIS — R262 Difficulty in walking, not elsewhere classified: Secondary | ICD-10-CM

## 2018-02-06 DIAGNOSIS — G8929 Other chronic pain: Secondary | ICD-10-CM

## 2018-02-06 DIAGNOSIS — M25652 Stiffness of left hip, not elsewhere classified: Secondary | ICD-10-CM

## 2018-02-06 DIAGNOSIS — M6281 Muscle weakness (generalized): Secondary | ICD-10-CM

## 2018-02-06 DIAGNOSIS — M5442 Lumbago with sciatica, left side: Secondary | ICD-10-CM | POA: Diagnosis not present

## 2018-02-06 NOTE — Therapy (Signed)
[place], [place], [address] Phone: [phone]   Fax:  [phone]  Physical Therapy Treatment  Patient Details  Name: Kristin [name] MRN: [id number] Date of Birth: [birth year] Referring Provider: Dr Charlann Boxer    Encounter Date: 02/06/2018  PT End of Session - 02/06/18 0725    Visit Number  6    Number of Visits  16    Date for PT Re-Evaluation  03/10/18    Authorization Type  [employer] No [place] without EOB     PT Start Time  0718    PT Stop Time  0757 requests heat and needs to leave early for work    PT Time Calculation (min)  39 min       Past Medical History:  Diagnosis Date  . History of kidney stones   . Kidney stone   . Kidney stones    Kidney Stones  . Knee joint pain   . Pregnancy induced hypertension   . Syncope     Past Surgical History:  Procedure Laterality Date  . EXTRACORPOREAL SHOCK WAVE LITHOTRIPSY Left 01/30/2017   Procedure: LEFT EXTRACORPOREAL SHOCK WAVE LITHOTRIPSY (ESWL);  Surgeon: Cleon Gustin, MD;  Location: [facility];  Service: Urology;  Laterality: Left;  . KIDNEY STONE SURGERY      There were no vitals filed for this visit.  Subjective Assessment - 02/06/18 0720    Subjective  Went on a trip. Did fine traveling down. After alot of walking, left leg was swollen and had back pain.    Currently in Pain?  Yes    Pain Score  5     Pain Location  Back    Pain Orientation  Left;Lower    Pain Descriptors / Indicators  Sore    Aggravating Factors   prolonged walking    Pain Relieving Factors  sleeping on stomach, rest, heat                        [facility] Adult PT Treatment/Exercise - 02/06/18 0001      Lumbar Exercises: Stretches   Piriformis Stretch  2 reps;30 seconds    Piriformis Stretch Limitations  supine    Figure 4 Stretch  2 reps;30 seconds      Lumbar Exercises: Supine   Clam  20 reps    Clam Limitations  green    Bent Knee Raise  Limitations  into table top 10 sec x 2, then alternating toe taps from table top , 3 bouts of 10 taps with cues for neutral spine     Bridge with clamshell  10 reps    Straight Leg Raise  15 reps bilateral      Lumbar Exercises: Quadruped   Madcat/Old Horse  10 reps    Straight Leg Raise  5 reps    Opposite Arm/Leg Raise  5 reps    Other Quadruped Lumbar Exercises  Fire Hydrants x 10 , cues for neutral       Moist Heat Therapy   Number Minutes Moist Heat  10 Minutes    Moist Heat Location  Lumbar Spine               PT Short Term Goals - 02/06/18 0734      PT SHORT TERM GOAL #1   Title  Patient will demsotrate a good core contraction     Time  4    Period  Weeks  Status  On-going      PT SHORT TERM GOAL #2   Title  Patient will increase passive left hip flexion to 110 degrees without pain     Baseline  130, no pain    Time  4    Status  Achieved      PT SHORT TERM GOAL #3   Title  Patient will be independent with basic HEP for core strengthening     Time  4    Period  Weeks    Status  Achieved      PT SHORT TERM GOAL #4   Title  Patient will increase gross left lower extremity strength to 4+/5     Time  4    Period  Weeks    Status  Unable to assess        PT Long Term Goals - 02/06/18 0735      PT LONG TERM GOAL #1   Title  Patient will ambualte 1000' without increased pain with inproved left hip flexion and decreased left hip abduction inorder to perfrom work tasks     Baseline  6000 feet before increased pain, gait improved     Time  8    Period  Weeks    Status  Achieved      PT LONG TERM GOAL #2   Title  Patient will demsotrate a 43% limiation on FOTO     Time  4    Period  Weeks    Status  Unable to assess      PT LONG TERM GOAL #3   Title  Patiewnt will pick 25 lb weight with proper breathing and technqiue without pain in order to perfform work tasks     Time  4    Period  Weeks    Status  Unable to assess            Plan -  02/06/18 0729    Clinical Impression Statement  Pt reports increased pain with alot of walking on weekend road trip. She did not have pain during the drive however she began to have pain after 6,000 steps per her activity tracker. LTG#1 met. Her pain reached 8/10. She reports increased swelling in left leg. Pain is 5/10 today and left sided.     PT Next Visit Plan  review  HEP-, TPDN when schedule allows, soft tissue work, lying on back becoming more comfortable , does well on stomach/ quadruped     PT Home Exercise Plan  seated hamstring stretch; seated glut stretch; tennis ball trigger point release; abdominal breathing, side clam green band , standing lumbar extensions , supine table top with toe taps.     Consulted and Agree with Plan of Care  Patient       Patient will benefit from skilled therapeutic intervention in order to improve the following deficits and impairments:  Pain, Abnormal gait, Decreased activity tolerance, Decreased endurance, Decreased range of motion, Decreased strength, Difficulty walking, Postural dysfunction  Visit Diagnosis: Chronic left-sided low back pain with left-sided sciatica  Difficulty in walking, not elsewhere classified  Stiffness of left hip, not elsewhere classified  Muscle weakness (generalized)     Problem List Patient Active Problem List   Diagnosis Date Noted  . Piriformis syndrome of left side 12/30/2017  . Nonallopathic lesion of sacral region 07/22/2017  . Nonallopathic lesion of lumbosacral region 07/22/2017  . Nonallopathic lesion of thoracic region 07/22/2017  . 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  Vaginal delivery 04/15/2016  . BMI 40.0-44.9, adult ([place]) 04/14/2016  . Kidney stones--hx  03/24/2016  . History of pregnancy induced hypertension--prior pregnancy 03/24/2016  . Positive GBS test 03/24/2016  . LGA (large for gestational age) fetus--hx with 1st pregnancy, 9+6 03/01/2016  . Allergy to amoxicillin 03/01/2016  . HGSIL  (high grade squamous intraepithelial dysplasia) 03/01/2016  . Syncope 08/21/2015  . Hypokalemia 08/21/2015  . Anxiety 08/21/2015    Dorene Ar, PTA 02/06/2018, 8:09 AM  [facility] [address] [place], [place], [address] Phone: [phone]   Fax:  [phone]  Name: Kristin [name] MRN: [id number] Date of Birth: 1986/01/10

## 2018-02-09 ENCOUNTER — Ambulatory Visit: Payer: 59 | Admitting: Physical Therapy

## 2018-02-09 ENCOUNTER — Encounter: Payer: Self-pay | Admitting: Physical Therapy

## 2018-02-09 DIAGNOSIS — M6281 Muscle weakness (generalized): Secondary | ICD-10-CM

## 2018-02-09 DIAGNOSIS — M5442 Lumbago with sciatica, left side: Principal | ICD-10-CM

## 2018-02-09 DIAGNOSIS — M25652 Stiffness of left hip, not elsewhere classified: Secondary | ICD-10-CM

## 2018-02-09 DIAGNOSIS — G8929 Other chronic pain: Secondary | ICD-10-CM

## 2018-02-09 DIAGNOSIS — R262 Difficulty in walking, not elsewhere classified: Secondary | ICD-10-CM

## 2018-02-09 NOTE — Therapy (Signed)
[facility] [address] [place], [place], [address] Phone: [phone]   Fax:  [phone]  Physical Therapy Treatment  Patient Details  Name: CHINAZA ROOKE MRN: [id number] Date of Birth: [birth year] Referring Provider: Dr Terrilee Files    Encounter Date: 02/09/2018  PT End of Session - 02/09/18 1058    Visit Number  7    Number of Visits  16    Date for PT Re-Evaluation  03/10/18    Authorization Type  [employer] No [place] without EOB     PT Start Time  0930    PT Stop Time  1024    PT Time Calculation (min)  54 min    Activity Tolerance  Patient tolerated treatment well    Behavior During Therapy  [facility] for tasks assessed/performed       Past Medical History:  Diagnosis Date  . History of kidney stones   . Kidney stone   . Kidney stones    Kidney Stones  . Knee joint pain   . Pregnancy induced hypertension   . Syncope     Past Surgical History:  Procedure Laterality Date  . EXTRACORPOREAL SHOCK WAVE LITHOTRIPSY Left 01/30/2017   Procedure: LEFT EXTRACORPOREAL SHOCK WAVE LITHOTRIPSY (ESWL);  Surgeon: Malen Gauze, MD;  Location: [facility];  Service: Urology;  Laterality: Left;  . KIDNEY STONE SURGERY      There were no vitals filed for this visit.  Subjective Assessment - 02/09/18 1056    Subjective  Patient reports the back is improving but she is still having pain when she is sitting and lying on her back.     Pertinent History  anxiety; kidney stones,     Diagnostic tests  Lumbar CT: bilateral pars deffect    Patient Stated Goals  to have less pain in her back.     Currently in Pain?  Yes    Pain Score  3     Pain Location  Back    Pain Orientation  Left    Pain Descriptors / Indicators  Sore    Pain Type  Chronic pain    Pain Radiating Towards  into the left leg and foot     Pain Onset  More than a month ago    Aggravating Factors   prolnged walking     Pain Relieving Factors  sleeping on stomach     Effect  of Pain on Daily Activities  difficulty perfroming wok and household tasks                        [facility] Adult PT Treatment/Exercise - 02/09/18 0001      Lumbar Exercises: Stretches   Piriformis Stretch  2 reps;30 seconds    Figure 4 Stretch  2 reps;30 seconds      Lumbar Exercises: Supine   Clam  20 reps    Clam Limitations  green    Bridge  10 reps      Manual Therapy   Manual therapy comments  Prone Soft tissue work to left lumbar paraspinals and upper gluteal, LAD to bilateral lower extremity       Trigger Point Dry Needling - 02/09/18 1543    Consent Given?  Yes    Education Handout Provided  Yes    Muscles Treated Upper Body  Longissimus    Muscles Treated Lower Body  Gluteus maximus    Longissimus Response  Twitch response elicited lumbar paraspinals  Gluteus Maximus Response  Twitch response elicited glut minimus            PT Education - 02/09/18 1057    Education Details  reviewed TpzDN benefits and risk; reviewed decreasing post needle soreness     Person(s) Educated  Patient    Methods  Explanation;Demonstration;Tactile cues;Verbal cues    Comprehension  Verbalized understanding;Returned demonstration;Tactile cues required;Verbal cues required       PT Short Term Goals - 02/06/18 0734      PT SHORT TERM GOAL #1   Title  Patient will demsotrate a good core contraction     Time  4    Period  Weeks    Status  On-going      PT SHORT TERM GOAL #2   Title  Patient will increase passive left hip flexion to 110 degrees without pain     Baseline  130, no pain    Time  4    Status  Achieved      PT SHORT TERM GOAL #3   Title  Patient will be independent with basic HEP for core strengthening     Time  4    Period  Weeks    Status  Achieved      PT SHORT TERM GOAL #4   Title  Patient will increase gross left lower extremity strength to 4+/5     Time  4    Period  Weeks    Status  Unable to assess        PT Long Term Goals -  02/06/18 0735      PT LONG TERM GOAL #1   Title  Patient will ambualte 1000' without increased pain with inproved left hip flexion and decreased left hip abduction inorder to perfrom work tasks     Baseline  6000 feet before increased pain, gait improved     Time  8    Period  Weeks    Status  Achieved      PT LONG TERM GOAL #2   Title  Patient will demsotrate a 43% limiation on FOTO     Time  4    Period  Weeks    Status  Unable to assess      PT LONG TERM GOAL #3   Title  Patiewnt will pick 25 lb weight with proper breathing and technqiue without pain in order to perfform work tasks     Time  4    Period  Weeks    Status  Unable to assess            Plan - 02/09/18 1540    Clinical Impression Statement  Good twitch reponse noted in gluteals and lumbar paraspianls. Therapy reviewed stretches and light exercises to improve post needle soreness. Thepatients hip movement appears to be progressing. she was enbocuraged to continue to work on core strengtehning.     Clinical Decision Making  Low    Rehab Potential  Good    PT Frequency  2x / week    PT Duration  8 weeks    PT Treatment/Interventions  ADLs/Self Care Home Management;Cryotherapy;Electrical Stimulation;Moist Heat;Ultrasound;Gait training;Stair training;Functional mobility training;Therapeutic activities;Therapeutic exercise;Neuromuscular re-education;Patient/family education;Manual techniques;Passive range of motion;Dry needling;Splinting;Taping    PT Next Visit Plan  review  HEP-, TPDN when schedule allows, soft tissue work, lying on back becoming more comfortable , does well on stomach/ quadruped     PT Home Exercise Plan  seated hamstring stretch; seated glut stretch; tennis  ball trigger point release; abdominal breathing, side clam green band , standing lumbar extensions , supine table top with toe taps.     Consulted and Agree with Plan of Care  Patient       Patient will benefit from skilled therapeutic  intervention in order to improve the following deficits and impairments:  Pain, Abnormal gait, Decreased activity tolerance, Decreased endurance, Decreased range of motion, Decreased strength, Difficulty walking, Postural dysfunction  Visit Diagnosis: Chronic left-sided low back pain with left-sided sciatica  Difficulty in walking, not elsewhere classified  Stiffness of left hip, not elsewhere classified  Muscle weakness (generalized)     Problem List Patient Active Problem List   Diagnosis Date Noted  . Piriformis syndrome of left side 12/30/2017  . Nonallopathic lesion of sacral region 07/22/2017  . Nonallopathic lesion of lumbosacral region 07/22/2017  . Nonallopathic lesion of thoracic region 07/22/2017  . 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 Vaginal delivery 04/15/2016  . BMI 40.0-44.9, adult ([facility]) 04/14/2016  . Kidney stones--hx  03/24/2016  . History of pregnancy induced hypertension--prior pregnancy 03/24/2016  . Positive GBS test 03/24/2016  . LGA (large for gestational age) fetus--hx with 1st pregnancy, 9+6 03/01/2016  . Allergy to amoxicillin 03/01/2016  . 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 Syncope 08/21/2015  . Hypokalemia 08/21/2015  . Anxiety 08/21/2015    Dessie Coma PT DPT  02/09/2018, 3:45 PM  [facility] [address] [place], [place], [address] Phone: [phone]   Fax:  [phone]  Name: NALEAH KOFOED MRN: [id number] Date of Birth: 03-May-1986

## 2018-02-11 ENCOUNTER — Ambulatory Visit: Payer: 59 | Admitting: Physical Therapy

## 2018-02-11 ENCOUNTER — Encounter: Payer: Self-pay | Admitting: Physical Therapy

## 2018-02-11 DIAGNOSIS — M5442 Lumbago with sciatica, left side: Principal | ICD-10-CM

## 2018-02-11 DIAGNOSIS — M6281 Muscle weakness (generalized): Secondary | ICD-10-CM

## 2018-02-11 DIAGNOSIS — M25652 Stiffness of left hip, not elsewhere classified: Secondary | ICD-10-CM

## 2018-02-11 DIAGNOSIS — R262 Difficulty in walking, not elsewhere classified: Secondary | ICD-10-CM

## 2018-02-11 DIAGNOSIS — G8929 Other chronic pain: Secondary | ICD-10-CM

## 2018-02-11 NOTE — Therapy (Signed)
[facility] [address] [place], [place], [address] Phone: [phone]   Fax:  [phone]  Physical Therapy Treatment  Patient Details  Name: Kristin [name] MRN: [id number] Date of Birth: 1986-06-25 Referring Provider: Dr Terrilee Files    Encounter Date: 02/11/2018  PT End of Session - 02/11/18 0746    Visit Number  8    Number of Visits  16    Date for PT Re-Evaluation  03/10/18    Authorization Type  [employer]na No [place] without EOB     PT Start Time  0715    PT Stop Time  0748 request to leave early for work    PT Time Calculation (min)  33 min       Past Medical History:  Diagnosis Date  . History of kidney stones   . Kidney stone   . Kidney stones    Kidney Stones  . Knee joint pain   . Pregnancy induced hypertension   . Syncope     Past Surgical History:  Procedure Laterality Date  . EXTRACORPOREAL SHOCK WAVE LITHOTRIPSY Left 01/30/2017   Procedure: LEFT EXTRACORPOREAL SHOCK WAVE LITHOTRIPSY (ESWL);  Surgeon: Malen Gauze, MD;  Location: [facility];  Service: Urology;  Laterality: Left;  . KIDNEY STONE SURGERY      There were no vitals filed for this visit.  Subjective Assessment - 02/11/18 0727    Subjective  More soreness since TPDN especially when laying on it.     Currently in Pain?  Yes    Pain Score  5     Pain Location  Back    Pain Orientation  Left    Pain Descriptors / Indicators  Sore         [facility] PT Assessment - 02/11/18 0001      Observation/Other Assessments   Focus on Therapeutic Outcomes (FOTO)   52% improved from 67% limited                    [facility] Adult PT Treatment/Exercise - 02/11/18 0001      Lumbar Exercises: Stretches   Active Hamstring Stretch  3 reps;30 seconds    Single Knee to Chest Stretch  3 reps;30 seconds    Piriformis Stretch  2 reps;30 seconds    Figure 4 Stretch  2 reps;30 seconds      Lumbar Exercises: Aerobic   Nustep  5 min L5 5 UE/LE       Lumbar Exercises: Seated   Sit to Stand  10 reps without UE       Lumbar Exercises: Supine   Clam  20 reps    Clam Limitations  green    Bridge  15 reps    Bridge with clamshell  10 reps    Straight Leg Raise  15 reps ab draw in , bilateral , 2 sets      Lumbar Exercises: Sidelying   Clam  20 reps green    Clam Limitations  cues for abdominal draw in       Lumbar Exercises: Quadruped   Madcat/Old Horse  10 reps    Opposite Arm/Leg Raise  10 reps    Other Quadruped Lumbar Exercises  Fire Hydrants x 10 , cues for neutral       Moist Heat Therapy   Number Minutes Moist Heat  10 Minutes    Moist Heat Location  Lumbar Spine  PT Short Term Goals - 02/11/18 0843      PT SHORT TERM GOAL #1   Title  Patient will demsotrate a good core contraction     Time  4    Period  Weeks    Status  On-going      PT SHORT TERM GOAL #2   Title  Patient will increase passive left hip flexion to 110 degrees without pain     Baseline  130, no pain    Time  4    Period  Weeks    Status  Achieved      PT SHORT TERM GOAL #3   Title  Patient will be independent with basic HEP for core strengthening     Time  4    Period  Weeks    Status  Achieved      PT SHORT TERM GOAL #4   Title  Patient will increase gross left lower extremity strength to 4+/5     Baseline  Left hip flexion fatigues quickly with SLR     Time  4    Period  Weeks    Status  On-going        PT Long Term Goals - 02/06/18 0735      PT LONG TERM GOAL #1   Title  Patient will ambualte 1000' without increased pain with inproved left hip flexion and decreased left hip abduction inorder to perfrom work tasks     Baseline  6000 feet before increased pain, gait improved     Time  8    Period  Weeks    Status  Achieved      PT LONG TERM GOAL #2   Title  Patient will demsotrate a 43% limiation on FOTO     Time  4    Period  Weeks    Status  Unable to assess      PT LONG TERM GOAL #3   Title   Patiewnt will pick 25 lb weight with proper breathing and technqiue without pain in order to perfform work tasks     Time  4    Period  Weeks    Status  Unable to assess            Plan - 02/11/18 0841    Clinical Impression Statement  Soreness after trigger point dy needling treatment 2 days ago. Continued with gentle core and trunk mobility to decrease soreness. HMP at end of session. She will see MD next week and plans to continue with PT for 2 more weeks to allow for more TPDN sessions. FOTO score improved.     PT Next Visit Plan  review  HEP-, TPDN when schedule allows, soft tissue work, lying on back becoming more comfortable , does well on stomach/ quadruped     PT Home Exercise Plan  seated hamstring stretch; seated glut stretch; tennis ball trigger point release; abdominal breathing, side clam green band , standing lumbar extensions , supine table top with toe taps.     Consulted and Agree with Plan of Care  Patient       Patient will benefit from skilled therapeutic intervention in order to improve the following deficits and impairments:  Pain, Abnormal gait, Decreased activity tolerance, Decreased endurance, Decreased range of motion, Decreased strength, Difficulty walking, Postural dysfunction  Visit Diagnosis: Chronic left-sided low back pain with left-sided sciatica  Difficulty in walking, not elsewhere classified  Stiffness of left hip, not elsewhere classified  Muscle weakness (generalized)     Problem List Patient Active Problem List   Diagnosis Date Noted  . Piriformis syndrome of left side 12/30/2017  . Nonallopathic lesion of sacral region 07/22/2017  . Nonallopathic lesion of lumbosacral region 07/22/2017  . Nonallopathic lesion of thoracic region 07/22/2017  . 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 Vaginal delivery 04/15/2016  . BMI 40.0-44.9, adult ([facility]) 04/14/2016  . Kidney stones--hx  03/24/2016  . History of pregnancy induced  hypertension--prior pregnancy 03/24/2016  . Positive GBS test 03/24/2016  . LGA (large for gestational age) fetus--hx with 1st pregnancy, 9+6 03/01/2016  . Allergy to amoxicillin 03/01/2016  . 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 Syncope 08/21/2015  . Hypokalemia 08/21/2015  . Anxiety 08/21/2015    Sherrie Mustache, PTA 02/11/2018, 8:46 AM  [facility] [address] [place], [place], [address] Phone: [phone]   Fax:  [phone]  Name: Kristin [name] MRN: [id number] Date of Birth: [birth year]

## 2018-02-17 NOTE — Progress Notes (Signed)
[name] [address] [place], [place] [address] Phone: [phone] Subjective:     CC: Back pain follow-up  [id number]  Kristin [name] is a 32 y.o. female coming in with complaint of back pain. She is feeling better. She had dry needling done last week which caused a decrease in her muscle spasms.  Patient does have a spondylolisthesis with a pars defect at L5.  Minimal radicular symptoms.  Has responded well to physical therapy.  Doing that 2-3 times a week.  Has had some improvement with dry needling as well.    Past Medical History:  Diagnosis Date  . History of kidney stones   . Kidney stone   . Kidney stones    Kidney Stones  . Knee joint pain   . Pregnancy induced hypertension   . Syncope    Past Surgical History:  Procedure Laterality Date  . EXTRACORPOREAL SHOCK WAVE LITHOTRIPSY Left 01/30/2017   Procedure: LEFT EXTRACORPOREAL SHOCK WAVE LITHOTRIPSY (ESWL);  Surgeon: Malen Gauze, MD;  Location: [facility];  Service: Urology;  Laterality: Left;  . KIDNEY STONE SURGERY     Social History   Socioeconomic History  . Marital status: Married    Spouse name: Not on file  . Number of children: Not on file  . Years of education: Not on file  . Highest education level: Not on file  Occupational History  . Not on file  Social Needs  . Financial resource strain: Not on file  . Food insecurity:    Worry: Not on file    Inability: Not on file  . Transportation needs:    Medical: Not on file    Non-medical: Not on file  Tobacco Use  . Smoking status: Never Smoker  . Smokeless tobacco: Never Used  Substance and Sexual Activity  . Alcohol use: No  . Drug use: No  . Sexual activity: Not on file  Lifestyle  . Physical activity:    Days per week: Not on file    Minutes per session: Not on file  . Stress: Not on file  Relationships  . Social connections:    Talks on phone: Not on file    Gets together: Not on file    Attends  religious service: Not on file    Active member of club or organization: Not on file    Attends meetings of clubs or organizations: Not on file    Relationship status: Not on file  Other Topics Concern  . Not on file  Social History Narrative  . Not on file   Allergies  Allergen Reactions  . Amoxicillin Hives    Has patient had a PCN reaction causing immediate rash, facial/tongue/throat swelling, SOB or lightheadedness with hypotension: no Has patient had a PCN reaction causing severe rash involving mucus membranes or skin necrosis: No Has patient had a PCN reaction that required hospitalization No Has patient had a PCN reaction occurring within the last 10 years: No If all of the above answers are "NO", then may proceed with Cephalosporin use.   Family History  Problem Relation Age of Onset  . CVA Mother   . Hypertension Mother   . Diabetes Mellitus II Mother   . Congestive Heart Failure Mother   . CVA Father   . Hypertension Father   . Diabetes Mellitus II Father      Past medical history, social, surgical and family history all reviewed in electronic medical record.  No pertanent information unless stated regarding to the chief complaint.   Review of Systems:Review of systems updated and as accurate as of 02/18/18  No headache, visual changes, nausea, vomiting, diarrhea, constipation, dizziness, abdominal pain, skin rash, fevers, chills, night sweats, weight loss, swollen lymph nodes, body aches, joint swelling, muscle aches, chest pain, shortness of breath, mood changes.   Objective  Blood pressure 122/88, pulse 75, height  (1.651 m), weight 245 lb (111.1 kg), SpO2 96 %, currently breastfeeding. Systems examined below as of 02/18/18   General: No apparent distress alert and oriented x3 mood and affect normal, dressed appropriately.  HEENT: Pupils equal, extraocular movements intact  Respiratory: Patient's speak in full sentences and does not appear short of breath    Cardiovascular: No lower extremity edema, non tender, no erythema  Skin: Warm dry intact with no signs of infection or rash on extremities or on axial skeleton.  Abdomen: Soft nontender  Neuro: Cranial nerves II through XII are intact, neurovascularly intact in all extremities with 2+ DTRs and 2+ pulses.  Lymph: No lymphadenopathy of posterior or anterior cervical chain or axillae bilaterally.  Gait normal with good balance and coordination.  MSK:  Non tender with full range of motion and good stability and symmetric strength and tone of shoulders, elbows, wrist, hip, knee and ankles bilaterally.  Back Exam:  Inspection: Mild loss of lordosis Motion: Flexion 45 deg, Extension 25 deg, Side Bending to 40 deg bilaterally,  Rotation to 05 deg bilaterally  SLR laying: Negative  XSLR laying: Negative  Palpable tenderness: Tender to palpation the paraspinal musculature lumbar spine. FABER: Mild positive Faber bilaterally. Sensory change: Gross sensation intact to all lumbar and sacral dermatomes.  Reflexes: 2+ at both patellar tendons, 2+ at achilles tendons, Babinski's downgoing.  Strength at foot  Plantar-flexion: 5/5 Dorsi-flexion: 5/5 Eversion: 5/5 Inversion: 5/5  Leg strength  Quad: 5/5 Hamstring: 5/5 Hip flexor: 5/5 Hip abductors: 5/5  Gait unremarkable.  Osteopathic findings  T9 extended rotated and side bent left L2 flexed rotated and side bent right Sacrum right on right    Impression and Recommendations:     This case required medical decision making of moderate complexity.      Note: This dictation was prepared with Dragon dictation along with smaller phrase technology. Any transcriptional errors that result from this process are unintentional.

## 2018-02-18 ENCOUNTER — Encounter: Payer: Self-pay | Admitting: Family Medicine

## 2018-02-18 ENCOUNTER — Ambulatory Visit: Payer: 59 | Admitting: Family Medicine

## 2018-02-18 VITALS — BP 122/88 | HR 75 | Ht 65.0 in | Wt 245.0 lb

## 2018-02-18 DIAGNOSIS — M4306 Spondylolysis, lumbar region: Secondary | ICD-10-CM

## 2018-02-18 DIAGNOSIS — M999 Biomechanical lesion, unspecified: Secondary | ICD-10-CM | POA: Diagnosis not present

## 2018-02-18 DIAGNOSIS — Z0279 Encounter for issue of other medical certificate: Secondary | ICD-10-CM

## 2018-02-18 NOTE — Patient Instructions (Signed)
Good to see you  Kristin [name] is your friend.  Keep it up  OK to go to the gym but only 2 times a week and ok to do machines and low impact exercises We will fill out the paper work.  No change in meds See me again in 4-6 weeks!

## 2018-02-18 NOTE — Assessment & Plan Note (Signed)
Continues to have some discomfort.  We discussed different medications.  Doing well with the Effexor.  Discussed the once weekly vitamin D.  Continue the gabapentin if it is helping.  Discussed icing regimen.  Can always repeat an epidural if needed.  Responding well to manipulation.  Follow-up again in 4 to 6 weeks

## 2018-02-18 NOTE — Assessment & Plan Note (Signed)
Decision today to treat with OMT was based on Physical Exam  After verbal consent patient was treated with HVLA, ME, FPR techniques in  thoracic, lumbar and sacral areas  Patient tolerated the procedure well with improvement in symptoms  Patient given exercises, stretches and lifestyle modifications  See medications in patient instructions if given  Patient will follow up in 4-6 weeks 

## 2018-02-23 ENCOUNTER — Encounter: Payer: Self-pay | Admitting: Physical Therapy

## 2018-02-23 ENCOUNTER — Ambulatory Visit: Payer: 59 | Attending: Family Medicine | Admitting: Physical Therapy

## 2018-02-23 DIAGNOSIS — R262 Difficulty in walking, not elsewhere classified: Secondary | ICD-10-CM | POA: Diagnosis present

## 2018-02-23 DIAGNOSIS — M5442 Lumbago with sciatica, left side: Secondary | ICD-10-CM | POA: Insufficient documentation

## 2018-02-23 DIAGNOSIS — M25652 Stiffness of left hip, not elsewhere classified: Secondary | ICD-10-CM | POA: Diagnosis present

## 2018-02-23 DIAGNOSIS — G8929 Other chronic pain: Secondary | ICD-10-CM | POA: Diagnosis present

## 2018-02-23 DIAGNOSIS — M6281 Muscle weakness (generalized): Secondary | ICD-10-CM | POA: Diagnosis present

## 2018-02-23 NOTE — Therapy (Signed)
[facility] [address] [place], [place], [address] Phone: [phone]   Fax:  [phone]  Physical Therapy Treatment  Patient Details  Name: Kristin [name] MRN: [id number] Date of Birth: 03/05/1986 Referring Provider: Dr Terrilee FilesZach Smith    Encounter Date: 02/23/2018  PT End of Session - 02/23/18 1116    Visit Number  9    Number of Visits  16    Date for PT Re-Evaluation  03/10/18    Authorization Type  [employer] No [place] without EOB     PT Start Time  0932    PT Stop Time  1025    PT Time Calculation (min)  53 min    Activity Tolerance  Patient tolerated treatment well    Behavior During Therapy  [facility] for tasks assessed/performed       Past Medical History:  Diagnosis Date  . History of kidney stones   . Kidney stone   . Kidney stones    Kidney Stones  . Knee joint pain   . Pregnancy induced hypertension   . Syncope     Past Surgical History:  Procedure Laterality Date  . EXTRACORPOREAL SHOCK WAVE LITHOTRIPSY Left 01/30/2017   Procedure: LEFT EXTRACORPOREAL SHOCK WAVE LITHOTRIPSY (ESWL);  Surgeon: Malen GauzeMcKenzie, Patrick L, MD;  Location: [facility];  Service: Urology;  Laterality: Left;  . KIDNEY STONE SURGERY      There were no vitals filed for this visit.  Subjective Assessment - 02/23/18 1009    Subjective  Patient was having mild pain upon arival. She droive from [place] and back and also spent the night in the hospital with her husband. Obverall she feels like her back is improving. she would like to start going back to the gym.     Diagnostic tests  Lumbar CT: bilateral pars deffect    Patient Stated Goals  to have less pain in her back.     Currently in Pain?  Yes    Pain Score  4     Pain Onset  More than a month ago    Multiple Pain Sites  No                       [facility] Adult PT Treatment/Exercise - 02/23/18 0001      Lumbar Exercises: Stretches   Active Hamstring Stretch  3 reps;30 seconds     Piriformis Stretch  2 reps;30 seconds    Figure 4 Stretch  2 reps;30 seconds      Lumbar Exercises: Aerobic   Nustep  5 min L5 5 UE/LE       Lumbar Exercises: Machines for Strengthening   Leg Press  3x10 20lb 1x10 40 2lb 2x10     Other Lumbar Machine Exercise  row machine 15 lbs 3x10       Lumbar Exercises: Standing   Other Standing Lumbar Exercises  standing bicpes curls 4lb 2x10       Lumbar Exercises: Seated   Sit to Stand  10 reps without UE       Lumbar Exercises: Supine   Clam  20 reps    Clam Limitations  green    Bridge  15 reps    Bridge with clamshell  10 reps      Lumbar Exercises: Sidelying   Clam  20 reps green    Clam Limitations  cues for abdominal draw in       Moist Heat Therapy   Number  Minutes Moist Heat  10 Minutes    Moist Heat Location  Lumbar Spine      Manual Therapy   Manual therapy comments  Prone Soft tissue work to left lumbar paraspinals and upper gluteal, LAD to bilateral lower extremity       Trigger Point Dry Needling - 02/23/18 1233    Consent Given?  Yes    Education Handout Provided  Yes    Longissimus Response  Twitch response elicited    Gluteus Maximus Response  Twitch response elicited             PT Short Term Goals - 02/11/18 0843      PT SHORT TERM GOAL #1   Title  Patient will demsotrate a good core contraction     Time  4    Period  Weeks    Status  On-going      PT SHORT TERM GOAL #2   Title  Patient will increase passive left hip flexion to 110 degrees without pain     Baseline  130, no pain    Time  4    Period  Weeks    Status  Achieved      PT SHORT TERM GOAL #3   Title  Patient will be independent with basic HEP for core strengthening     Time  4    Period  Weeks    Status  Achieved      PT SHORT TERM GOAL #4   Title  Patient will increase gross left lower extremity strength to 4+/5     Baseline  Left hip flexion fatigues quickly with SLR     Time  4    Period  Weeks    Status  On-going         PT Long Term Goals - 02/06/18 0735      PT LONG TERM GOAL #1   Title  Patient will ambualte 1000' without increased pain with inproved left hip flexion and decreased left hip abduction inorder to perfrom work tasks     Baseline  6000 feet before increased pain, gait improved     Time  8    Period  Weeks    Status  Achieved      PT LONG TERM GOAL #2   Title  Patient will demsotrate a 43% limiation on FOTO     Time  4    Period  Weeks    Status  Unable to assess      PT LONG TERM GOAL #3   Title  Patiewnt will pick 25 lb weight with proper breathing and technqiue without pain in order to perfform work tasks     Time  4    Period  Weeks    Status  Unable to assess            Plan - 02/23/18 1146    Clinical Impression Statement  Patient tolerated needling well. She had a good twitch respose in the paraspainals and in her glut medius. Therapy reviewed gym exefcises with patient. She had no increase in pain with treatment. She will reutnr for a follow up tomorrow. Cosndeir trying her on the elliptical.     Clinical Presentation  Stable    Clinical Decision Making  Low    Rehab Potential  Good    PT Frequency  2x / week    PT Duration  8 weeks    PT Treatment/Interventions  ADLs/Self Care Home Management;Cryotherapy;Lobbyist  Stimulation;Moist Heat;Ultrasound;Gait training;Stair training;Functional mobility training;Therapeutic activities;Therapeutic exercise;Neuromuscular re-education;Patient/family education;Manual techniques;Passive range of motion;Dry needling;Splinting;Taping    PT Next Visit Plan  try elliptical, continue with core stengthening. Therapy     PT Home Exercise Plan  seated hamstring stretch; seated glut stretch; tennis ball trigger point release; abdominal breathing, side clam green band , standing lumbar extensions , supine table top with toe taps.     Consulted and Agree with Plan of Care  Patient       Patient will benefit from skilled therapeutic  intervention in order to improve the following deficits and impairments:  Pain, Abnormal gait, Decreased activity tolerance, Decreased endurance, Decreased range of motion, Decreased strength, Difficulty walking, Postural dysfunction  Visit Diagnosis: Chronic left-sided low back pain with left-sided sciatica  Difficulty in walking, not elsewhere classified  Stiffness of left hip, not elsewhere classified  Muscle weakness (generalized)     Problem List Patient Active Problem List   Diagnosis Date Noted  . Piriformis syndrome of left side 12/30/2017  . Nonallopathic lesion of sacral region 07/22/2017  . Nonallopathic lesion of lumbosacral region 07/22/2017  . Nonallopathic lesion of thoracic region 07/22/2017  . 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 Vaginal delivery 04/15/2016  . BMI 40.0-44.9, adult ([facility]) 04/14/2016  . Kidney stones--hx  03/24/2016  . History of pregnancy induced hypertension--prior pregnancy 03/24/2016  . Positive GBS test 03/24/2016  . LGA (large for gestational age) fetus--hx with 1st pregnancy, 9+6 03/01/2016  . Allergy to amoxicillin 03/01/2016  . 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 Syncope 08/21/2015  . Hypokalemia 08/21/2015  . Anxiety 08/21/2015    Dessie Coma PT DPT  02/23/2018, 12:37 PM  [facility] [address] [place], [place], [address] Phone: [phone]   Fax:  [phone]  Name: Kristin [name] MRN: [id number] Date of Birth: 08-16-1986

## 2018-02-24 ENCOUNTER — Ambulatory Visit: Payer: 59 | Admitting: Physical Therapy

## 2018-03-04 ENCOUNTER — Telehealth: Payer: Self-pay | Admitting: Physical Therapy

## 2018-03-04 NOTE — Telephone Encounter (Signed)
Spoke to patient regarding short treatment time tomorrow 03/04/2018. She agreed.

## 2018-03-05 ENCOUNTER — Ambulatory Visit: Payer: 59 | Admitting: Physical Therapy

## 2018-03-05 ENCOUNTER — Encounter: Payer: Self-pay | Admitting: Physical Therapy

## 2018-03-05 DIAGNOSIS — M5442 Lumbago with sciatica, left side: Secondary | ICD-10-CM | POA: Diagnosis not present

## 2018-03-05 DIAGNOSIS — R262 Difficulty in walking, not elsewhere classified: Secondary | ICD-10-CM

## 2018-03-05 DIAGNOSIS — G8929 Other chronic pain: Secondary | ICD-10-CM

## 2018-03-05 DIAGNOSIS — M25652 Stiffness of left hip, not elsewhere classified: Secondary | ICD-10-CM

## 2018-03-05 NOTE — Therapy (Signed)
[place], [place], [address] Phone: [phone]   Fax:  [phone]  Physical Therapy Treatment  Patient Details  Name: Kristin [name] MRN: [id number] Date of Birth: 1986/04/12 Referring Provider: Dr Charlann Boxer    Encounter Date: 03/05/2018  PT End of Session - 03/05/18 0727    Visit Number  10    Number of Visits  16    Date for PT Re-Evaluation  03/10/18    Authorization Type  [employer] No [place] without EOB     PT Start Time  0723    PT Stop Time  0755    PT Time Calculation (min)  32 min       Past Medical History:  Diagnosis Date  . History of kidney stones   . Kidney stone   . Kidney stones    Kidney Stones  . Knee joint pain   . Pregnancy induced hypertension   . Syncope     Past Surgical History:  Procedure Laterality Date  . EXTRACORPOREAL SHOCK WAVE LITHOTRIPSY Left 01/30/2017   Procedure: LEFT EXTRACORPOREAL SHOCK WAVE LITHOTRIPSY (ESWL);  Surgeon: Cleon Gustin, MD;  Location: [facility];  Service: Urology;  Laterality: Left;  . KIDNEY STONE SURGERY      There were no vitals filed for this visit.  Subjective Assessment - 03/05/18 0726    Subjective  No pain now. Walking up to 10 minutes however pain reaches 6-7/10 in lower back , left side.    Currently in Pain?  No/denies    Aggravating Factors   walking     Pain Relieving Factors  sleeping on stomach, ibuprofin         [facility] PT Assessment - 03/05/18 0001      Strength   Right/Left Hip  Right;Left    Right Hip Flexion  4+/5    Right Hip Extension  4+/5    Right Hip ABduction  5/5    Left Hip Flexion  4-/5    Left Hip Extension  4-/5    Left Hip ABduction  4+/5    Left Ankle Dorsiflexion  --                   [facility] Adult PT Treatment/Exercise - 03/05/18 0001      Lumbar Exercises: Aerobic   Elliptical    no increased pain    Nustep  3 min      Lumbar Exercises: Supine   Bent Knee Raise Limitations   from table top, toes taps 24 times. then 28 times.     Bridge with clamshell  10 reps blue    Straight Leg Raise  20 reps      Lumbar Exercises: Prone   Straight Leg Raise  10 reps 2 sets, bilateral      Lumbar Exercises: Quadruped   Opposite Arm/Leg Raise  10 reps 10 sec               PT Short Term Goals - 03/05/18 0748      PT SHORT TERM GOAL #1   Title  Patient will demsotrate a good core contraction     Time  4    Period  Weeks    Status  Achieved      PT SHORT TERM GOAL #2   Title  Patient will increase passive left hip flexion to 110 degrees without pain     Baseline  130, no pain    Time  4    Period  Weeks    Status  Achieved      PT SHORT TERM GOAL #3   Title  Patient will be independent with basic HEP for core strengthening     Time  4    Period  Weeks    Status  Achieved      PT SHORT TERM GOAL #4   Title  Patient will increase gross left lower extremity strength to 4+/5     Baseline  Left hip flexion 4-/5    Time  4    Period  Weeks    Status  Partially Met        PT Long Term Goals - 03/05/18 0820      PT LONG TERM GOAL #1   Title  Patient will ambualte 1000' without increased pain with inproved left hip flexion and decreased left hip abduction inorder to perfrom work tasks     Baseline  6000 feet before increased pain, gait improved     Time  8    Period  Weeks    Status  Achieved      PT LONG TERM GOAL #2   Title  Patient will demsotrate a 43% limiation on FOTO     Baseline  52% last status     Time  4    Period  Weeks    Status  On-going      PT LONG TERM GOAL #3   Title  Patiewnt will pick 25 lb weight with proper breathing and technqiue without pain in order to perfform work tasks     Time  4    Period  Weeks    Status  Unable to assess            Plan - 03/05/18 0816    Clinical Impression Statement  Pt arrives reporting no pain. She is doing well with job duties and does not lift children unless she is seated. She  attempts to walk on her work break however reports increased left low back pain after 10 minutes requiring her to sit down. Her hip strength has improved however she still demonstrates weakness on left side. We focused core and hip strength today. She was able to ride elliptical 10 minutes today without pain. STG# 4 partially met.     PT Next Visit Plan  ERO, TPDN, continue elliptical, core and hip strength     PT Home Exercise Plan  seated hamstring stretch; seated glut stretch; tennis ball trigger point release; abdominal breathing, side clam green band , standing lumbar extensions , supine table top with toe taps. prone hip extensions     Consulted and Agree with Plan of Care  Patient       Patient will benefit from skilled therapeutic intervention in order to improve the following deficits and impairments:  Pain, Abnormal gait, Decreased activity tolerance, Decreased endurance, Decreased range of motion, Decreased strength, Difficulty walking, Postural dysfunction  Visit Diagnosis: Chronic left-sided low back pain with left-sided sciatica  Difficulty in walking, not elsewhere classified  Stiffness of left hip, not elsewhere classified     Problem List Patient Active Problem List   Diagnosis Date Noted  . Piriformis syndrome of left side 12/30/2017  . Nonallopathic lesion of sacral region 07/22/2017  . Nonallopathic lesion of lumbosacral region 07/22/2017  . Nonallopathic lesion of thoracic region 07/22/2017  . 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 Vaginal delivery 04/15/2016  . BMI 40.0-44.9, adult ([place])  04/14/2016  . Kidney stones--hx  03/24/2016  . History of pregnancy induced hypertension--prior pregnancy 03/24/2016  . Positive GBS test 03/24/2016  . LGA (large for gestational age) fetus--hx with 1st pregnancy, 9+6 03/01/2016  . Allergy to amoxicillin 03/01/2016  . 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 Syncope 08/21/2015  . Hypokalemia  08/21/2015  . Anxiety 08/21/2015    Dorene Ar, PTA 03/05/2018, 8:20 AM  [facility] [address] [place], [place], [address] Phone: [phone]   Fax:  [phone]  Name: JENELLA CRAIGIE MRN: [id number] Date of Birth: 1985-12-09

## 2018-03-06 ENCOUNTER — Ambulatory Visit: Payer: 59 | Admitting: Physical Therapy

## 2018-03-10 ENCOUNTER — Ambulatory Visit: Payer: 59 | Admitting: Physical Therapy

## 2018-03-10 ENCOUNTER — Encounter: Payer: Self-pay | Admitting: Physical Therapy

## 2018-03-10 DIAGNOSIS — R262 Difficulty in walking, not elsewhere classified: Secondary | ICD-10-CM

## 2018-03-10 DIAGNOSIS — M5442 Lumbago with sciatica, left side: Secondary | ICD-10-CM | POA: Diagnosis not present

## 2018-03-10 DIAGNOSIS — M6281 Muscle weakness (generalized): Secondary | ICD-10-CM

## 2018-03-10 DIAGNOSIS — G8929 Other chronic pain: Secondary | ICD-10-CM

## 2018-03-10 DIAGNOSIS — M25652 Stiffness of left hip, not elsewhere classified: Secondary | ICD-10-CM

## 2018-03-10 NOTE — Therapy (Addendum)
[place], [place], [address] Phone: [phone]   Fax:  [phone]  Physical Therapy Treatment/ Discharge   Patient Details  Name: Kristin [name] MRN: [id number] Date of Birth: November 01, 1985 Referring Provider: Dr Charlann Boxer    Encounter Date: 03/10/2018  PT End of Session - 03/10/18 0757    Visit Number  11    Number of Visits  16    Date for PT Re-Evaluation  03/10/18    Authorization Type  [employer] No [place] without EOB     PT Start Time  0730    PT Stop Time  0800    PT Time Calculation (min)  30 min    Activity Tolerance  Patient tolerated treatment well    Behavior During Therapy  [facility] for tasks assessed/performed       Past Medical History:  Diagnosis Date  . History of kidney stones   . Kidney stone   . Kidney stones    Kidney Stones  . Knee joint pain   . Pregnancy induced hypertension   . Syncope     Past Surgical History:  Procedure Laterality Date  . EXTRACORPOREAL SHOCK WAVE LITHOTRIPSY Left 01/30/2017   Procedure: LEFT EXTRACORPOREAL SHOCK WAVE LITHOTRIPSY (ESWL);  Surgeon: Cleon Gustin, MD;  Location: [facility];  Service: Urology;  Laterality: Left;  . KIDNEY STONE SURGERY      There were no vitals filed for this visit.  Subjective Assessment - 03/10/18 0755    Subjective  Patient went back to thegym. she was able o work on the [employer] minor sorenes. She only has minor soreness today,     Pertinent History  anxiety; kidney stones,     Diagnostic tests  Lumbar CT: bilateral pars deffect    Patient Stated Goals  to have less pain in her back.     Pain Score  2     Pain Location  Back    Pain Orientation  Left;Right;Mid    Pain Descriptors / Indicators  Sore    Pain Type  Chronic pain    Pain Onset  More than a month ago    Pain Frequency  Intermittent    Aggravating Factors   walking     Pain Relieving Factors  sleeping on stomach     Effect of Pain on Daily  Activities  difficulty perfroming work tasks                        [facility] Adult PT Treatment/Exercise - 03/10/18 0001      Lumbar Exercises: Stretches   Active Hamstring Stretch  3 reps;30 seconds    Piriformis Stretch  2 reps;30 seconds      Manual Therapy   Manual Therapy  Manual Traction;Soft tissue mobilization    Soft tissue mobilization  IASTYM to lumbar spine and into upper gluts     Manual Traction  LAD to bilateral LE 4x30 sec hold        Trigger Point Dry Needling - 03/10/18 1136    Consent Given?  Yes    Education Handout Provided  Yes    Longissimus Response  Twitch response elicited           PT Education - 03/10/18 0756    Education provided  Yes    Education Details  relieving TPDN post soreness     Person(s) Educated  Patient    Methods  Explanation;Demonstration;Tactile cues  Comprehension  Verbalized understanding;Returned demonstration;Verbal cues required;Tactile cues required       PT Short Term Goals - 03/05/18 0748      PT SHORT TERM GOAL #1   Title  Patient will demsotrate a good core contraction     Time  4    Period  Weeks    Status  Achieved      PT SHORT TERM GOAL #2   Title  Patient will increase passive left hip flexion to 110 degrees without pain     Baseline  130, no pain    Time  4    Period  Weeks    Status  Achieved      PT SHORT TERM GOAL #3   Title  Patient will be independent with basic HEP for core strengthening     Time  4    Period  Weeks    Status  Achieved      PT SHORT TERM GOAL #4   Title  Patient will increase gross left lower extremity strength to 4+/5     Baseline  Left hip flexion 4-/5    Time  4    Period  Weeks    Status  Partially Met        PT Long Term Goals - 03/05/18 0820      PT LONG TERM GOAL #1   Title  Patient will ambualte 1000' without increased pain with inproved left hip flexion and decreased left hip abduction inorder to perfrom work tasks     Baseline  6000 feet  before increased pain, gait improved     Time  8    Period  Weeks    Status  Achieved      PT LONG TERM GOAL #2   Title  Patient will demsotrate a 43% limiation on FOTO     Baseline  52% last status     Time  4    Period  Weeks    Status  On-going      PT LONG TERM GOAL #3   Title  Patiewnt will pick 25 lb weight with proper breathing and technqiue without pain in order to perfform work tasks     Time  4    Period  Weeks    Status  Unable to assess            Plan - 03/10/18 0758    Clinical Impression Statement  Patient has a small amount of spasming around L3- L4 area. There are less trigger points and they are much smaller. She has returned to the gym. She was encouraged to continue with her exercises at home. Therapy will review POC necxt visit for potentail D/C to HEP.     Clinical Presentation  Stable    Clinical Decision Making  Low    Rehab Potential  Good    PT Frequency  2x / week    PT Duration  8 weeks    PT Treatment/Interventions  ADLs/Self Care Home Management;Cryotherapy;Electrical Stimulation;Moist Heat;Ultrasound;Gait training;Stair training;Functional mobility training;Therapeutic activities;Therapeutic exercise;Neuromuscular re-education;Patient/family education;Manual techniques;Passive range of motion;Dry needling;Splinting;Taping    PT Next Visit Plan  Needling; review gym exercises; potential discharge     PT Home Exercise Plan  seated hamstring stretch; seated glut stretch; tennis ball trigger point release; abdominal breathing, side clam green band , standing lumbar extensions , supine table top with toe taps. prone hip extensions     Consulted and Agree with Plan of Care  Patient  Patient will benefit from skilled therapeutic intervention in order to improve the following deficits and impairments:  Pain, Abnormal gait, Decreased activity tolerance, Decreased endurance, Decreased range of motion, Decreased strength, Difficulty walking, Postural  dysfunction  Visit Diagnosis: Chronic left-sided low back pain with left-sided sciatica  Difficulty in walking, not elsewhere classified  Stiffness of left hip, not elsewhere classified  Muscle weakness (generalized)  PHYSICAL THERAPY DISCHARGE SUMMARY  Visits from Start of Care: 11  Current functional level related to goals / functional outcomes: Improved ability to stand   Remaining deficits: Difficulty bending down with children     Education / Equipment: HEP   Plan: Patient agrees to discharge.  Patient goals were met. Patient is being discharged due to not returning since the last visit.  ?????       Problem List Patient Active Problem List   Diagnosis Date Noted  . Piriformis syndrome of left side 12/30/2017  . Nonallopathic lesion of sacral region 07/22/2017  . Nonallopathic lesion of lumbosacral region 07/22/2017  . Nonallopathic lesion of thoracic region 07/22/2017  . 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 Vaginal delivery 04/15/2016  . BMI 40.0-44.9, adult ([place]) 04/14/2016  . Kidney stones--hx  03/24/2016  . History of pregnancy induced hypertension--prior pregnancy 03/24/2016  . Positive GBS test 03/24/2016  . LGA (large for gestational age) fetus--hx with 1st pregnancy, 9+6 03/01/2016  . Allergy to amoxicillin 03/01/2016  . 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 Syncope 08/21/2015  . Hypokalemia 08/21/2015  . Anxiety 08/21/2015    Carney Living PT DPT  03/10/2018, 11:38 AM  [facility] [address] [place], [place], [address] Phone: [phone]   Fax:  [phone]  Name: KARIME SCHEUERMANN MRN: [id number] Date of Birth: [birth year]

## 2018-03-17 NOTE — Progress Notes (Signed)
Zach Kirstan Fentress D.O. [facility] [address] [place], [place] [address] Phone: [phone] Subjective:    I'm seeing this patient by the request  of:    CC: Back pain follow-up  FAO:[id number]:Subjective  Kristin [name] is a 32 y.o. female coming in with complaint of low back pain.  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  Has had 3 separate epidurals.  In addition of this patient is on Effexor.  Has responded fairly well to epidural but continues to have some pain.  Going to formal physical therapy on a regular basis.  Continue to try to be active.  Working on weight loss and core strengthening.  Overall think she has made some improvement but still has days where the pain is severe.  Seems to be more localizing to the left sacrum.     Past Medical History:  Diagnosis Date  . History of kidney stones   . Kidney stone   . Kidney stones    Kidney Stones  . Knee joint pain   . Pregnancy induced hypertension   . Syncope    Past Surgical History:  Procedure Laterality Date  . EXTRACORPOREAL SHOCK WAVE LITHOTRIPSY Left 01/30/2017   Procedure: LEFT EXTRACORPOREAL SHOCK WAVE LITHOTRIPSY (ESWL);  Surgeon: Malen GauzeMcKenzie, Patrick L, MD;  Location: [facility];  Service: Urology;  Laterality: Left;  . KIDNEY STONE SURGERY     Social History   Socioeconomic History  . Marital status: Married    Spouse name: Not on file  . Number of children: Not on file  . Years of education: Not on file  . Highest education level: Not on file  Occupational History  . Not on file  Social Needs  . Financial resource strain: Not on file  . Food insecurity:    Worry: Not on file    Inability: Not on file  . Transportation needs:    Medical: Not on file    Non-medical: Not on file  Tobacco Use  . Smoking status: Never Smoker  . Smokeless tobacco: Never Used  Substance and Sexual Activity  . Alcohol use: No  . Drug use: No  . Sexual activity: Not on file  Lifestyle  . Physical activity:    Days per  week: Not on file    Minutes per session: Not on file  . Stress: Not on file  Relationships  . Social connections:    Talks on phone: Not on file    Gets together: Not on file    Attends religious service: Not on file    Active member of club or organization: Not on file    Attends meetings of clubs or organizations: Not on file    Relationship status: Not on file  Other Topics Concern  . Not on file  Social History Narrative  . Not on file   Allergies  Allergen Reactions  . Amoxicillin Hives    Has patient had a PCN reaction causing immediate rash, facial/tongue/throat swelling, SOB or lightheadedness with hypotension: no Has patient had a PCN reaction causing severe rash involving mucus membranes or skin necrosis: No Has patient had a PCN reaction that required hospitalization No Has patient had a PCN reaction occurring within the last 10 years: No If all of the above answers are "NO", then may proceed with Cephalosporin use.   Family History  Problem Relation Age of Onset  . CVA Mother   . Hypertension Mother   . Diabetes Mellitus II Mother   .  Congestive Heart Failure Mother   . CVA Father   . Hypertension Father   . Diabetes Mellitus II Father      Past medical history, social, surgical and family history all reviewed in electronic medical record.  No pertanent information unless stated regarding to the chief complaint.   Review of Systems:Review of systems updated and as accurate as of 03/18/18  No headache, visual changes, nausea, vomiting, diarrhea, constipation, dizziness, abdominal pain, skin rash, fevers, chills, night sweats, weight loss, swollen lymph nodes, body aches, joint swelling, chest pain, shortness of breath, mood changes.  Positive muscle aches  Objective  Blood pressure 128/84, height 5\' 5"  (1.651 m), weight 243 lb (110.2 kg), currently breastfeeding. Systems examined below as of 03/18/18   General: No apparent distress alert and oriented x3 mood  and affect normal, dressed appropriately.  HEENT: Pupils equal, extraocular movements intact  Respiratory: Patient's speak in full sentences and does not appear short of breath  Cardiovascular: No lower extremity edema, non tender, no erythema  Skin: Warm dry intact with no signs of infection or rash on extremities or on axial skeleton.  Abdomen: Soft nontender  Neuro: Cranial nerves II through XII are intact, neurovascularly intact in all extremities with 2+ DTRs and 2+ pulses.  Lymph: No lymphadenopathy of posterior or anterior cervical chain or axillae bilaterally.  Gait normal with good balance and coordination.  MSK:  Non tender with full range of motion and good stability and symmetric strength and tone of shoulders, elbows, wrist, hip, knee and ankles bilaterally.  Back Exam:  Inspection: Mild loss of lordosis with increasing kyphosis around the sacrum Motion: Flexion 45 deg, Extension 15 deg, Side Bending to 30 deg bilaterally,  Rotation to 35 deg bilaterally  SLR laying: Negative  XSLR laying: Negative  Palpable tenderness: Tender to palpation in the paraspinal musculature of the lumbar spine right greater than left. FABER: Positive left. Sensory change: Gross sensation intact to all lumbar and sacral dermatomes.  Reflexes: 2+ at both patellar tendons, 2+ at achilles tendons, Babinski's downgoing.  Strength at foot  Plantar-flexion: 5/5 Dorsi-flexion: 5/5 Eversion: 5/5 Inversion: 5/5  Leg strength  Quad: 5/5 Hamstring: 5/5 Hip flexor: 5/5 Hip abductors: 5/5  Gait unremarkable.  Procedure: Real-time Ultrasound Guided Injection of left sacroiliac joint injection Device: GE Logiq Q7 Ultrasound guided injection is preferred based studies that show increased duration, increased effect, greater accuracy, decreased procedural pain, increased response rate, and decreased cost with ultrasound guided versus blind injection.  Verbal informed consent obtained.  Time-out conducted.  Noted  no overlying erythema, induration, or other signs of local infection.  Skin prepped in a sterile fashion.  Local anesthesia: Topical Ethyl chloride.  With sterile technique and under real time ultrasound guidance: With a 21-gauge 2 inch needle patient was injected with a total of 1 cc of 0.5% Marcaine and 1 cc of Kenalog 40 mg/mL into the left sacroiliac joint Completed without difficulty  Pain immediately resolved suggesting accurate placement of the medication.  Advised to call if fevers/chills, erythema, induration, drainage, or persistent bleeding.  Images permanently stored and available for review in the ultrasound unit.  Impression: Technically successful ultrasound guided injection.   Impression and Recommendations:     This case required medical decision making of moderate complexity.      Note: This dictation was prepared with Dragon dictation along with smaller phrase technology. Any transcriptional errors that result from this process are unintentional.

## 2018-03-18 ENCOUNTER — Ambulatory Visit: Payer: Self-pay

## 2018-03-18 ENCOUNTER — Ambulatory Visit (INDEPENDENT_AMBULATORY_CARE_PROVIDER_SITE_OTHER): Payer: 59 | Admitting: Family Medicine

## 2018-03-18 ENCOUNTER — Encounter: Payer: Self-pay | Admitting: Family Medicine

## 2018-03-18 VITALS — BP 128/84 | Ht 65.0 in | Wt 243.0 lb

## 2018-03-18 DIAGNOSIS — M533 Sacrococcygeal disorders, not elsewhere classified: Secondary | ICD-10-CM

## 2018-03-18 DIAGNOSIS — M25551 Pain in right hip: Secondary | ICD-10-CM

## 2018-03-18 NOTE — Assessment & Plan Note (Signed)
Discussed icing regimen and home exercises.  Patient given injection today.  Tolerated the procedure well.  Discussed with continuing all other treatment at this time.  Follow-up again in 4 weeks to restart manipulation

## 2018-03-18 NOTE — Patient Instructions (Signed)
Good to see you  Kristin [name] is your friend.  Tried SI injection and I hope for the best  Continue all meds.  I am really hoping it will help  Up to you on PT  See me again in 4-6 weeks

## 2018-03-20 ENCOUNTER — Encounter

## 2018-03-24 ENCOUNTER — Ambulatory Visit: Payer: 59 | Admitting: Physical Therapy

## 2018-04-14 NOTE — Progress Notes (Signed)
[name] [address] [place], [place] [address] Phone: [phone] Subjective:    I'm seeing this patient by the request  of:    CC: Low back pain.  BJY:[id number]  Kristin [name] is a 32 y.o. female coming in with complaint of left hip pain. She is getting numbness on the superior aspect of glute. This started on Monday. Was sitting on floor with kids and thought this may have contributed. Numbness is intermittent but continuing since Monday. Walking and sitting causes the pain.  Still not as severe as what it was initially.  Patient states that it is more of just a numbness now but seems to be irritating her.  Patient did have a pars defect at L5.     Past Medical History:  Diagnosis Date  . History of kidney stones   . Kidney stone   . Kidney stones    Kidney Stones  . Knee joint pain   . Pregnancy induced hypertension   . Syncope    Past Surgical History:  Procedure Laterality Date  . EXTRACORPOREAL SHOCK WAVE LITHOTRIPSY Left 01/30/2017   Procedure: LEFT EXTRACORPOREAL SHOCK WAVE LITHOTRIPSY (ESWL);  Surgeon: Malen Gauze, MD;  Location: [facility];  Service: Urology;  Laterality: Left;  . KIDNEY STONE SURGERY     Social History   Socioeconomic History  . Marital status: Married    Spouse name: Not on file  . Number of children: Not on file  . Years of education: Not on file  . Highest education level: Not on file  Occupational History  . Not on file  Social Needs  . Financial resource strain: Not on file  . Food insecurity:    Worry: Not on file    Inability: Not on file  . Transportation needs:    Medical: Not on file    Non-medical: Not on file  Tobacco Use  . Smoking status: Never Smoker  . Smokeless tobacco: Never Used  Substance and Sexual Activity  . Alcohol use: No  . Drug use: No  . Sexual activity: Not on file  Lifestyle  . Physical activity:    Days per week: Not on file    Minutes per session: Not on file   . Stress: Not on file  Relationships  . Social connections:    Talks on phone: Not on file    Gets together: Not on file    Attends religious service: Not on file    Active member of club or organization: Not on file    Attends meetings of clubs or organizations: Not on file    Relationship status: Not on file  Other Topics Concern  . Not on file  Social History Narrative  . Not on file   Allergies  Allergen Reactions  . Amoxicillin Hives    Has patient had a PCN reaction causing immediate rash, facial/tongue/throat swelling, SOB or lightheadedness with hypotension: no Has patient had a PCN reaction causing severe rash involving mucus membranes or skin necrosis: No Has patient had a PCN reaction that required hospitalization No Has patient had a PCN reaction occurring within the last 10 years: No If all of the above answers are "NO", then may proceed with Cephalosporin use.   Family History  Problem Relation Age of Onset  . CVA Mother   . Hypertension Mother   . Diabetes Mellitus II Mother   . Congestive Heart Failure Mother   . CVA Father   .  Hypertension Father   . Diabetes Mellitus II Father      Past medical history, social, surgical and family history all reviewed in electronic medical record.  No pertanent information unless stated regarding to the chief complaint.   Review of Systems:Review of systems updated and as accurate as of 04/15/18  No headache, visual changes, nausea, vomiting, diarrhea, constipation, dizziness, abdominal pain, skin rash, fevers, chills, night sweats, weight loss, swollen lymph nodes, body aches, joint swelling, muscle aches, chest pain, shortness of breath, mood changes.   Objective  Blood pressure 130/88, pulse 78, height 5\' 5"  (1.651 m), weight 243 lb (110.2 kg), SpO2 97 %, currently breastfeeding. Systems examined below as of 04/15/18   General: No apparent distress alert and oriented x3 mood and affect normal, dressed appropriately.    HEENT: Pupils equal, extraocular movements intact  Respiratory: Patient's speak in full sentences and does not appear short of breath  Cardiovascular: No lower extremity edema, non tender, no erythema  Skin: Warm dry intact with no signs of infection or rash on extremities or on axial skeleton.  Abdomen: Soft nontender  Neuro: Cranial nerves II through XII are intact, neurovascularly intact in all extremities with 2+ DTRs and 2+ pulses.  Lymph: No lymphadenopathy of posterior or anterior cervical chain or axillae bilaterally.  Gait normal with good balance and coordination.  MSK:  Non tender with full range of motion and good stability and symmetric strength and tone of shoulders, elbows, wrist, hip, knee and ankles bilaterally.  Back Exam:  Inspection: Mild loss of lordosis Motion: Flexion 30 deg, Extension 25 deg, Side Bending to 35 deg bilaterally,  Rotation to 45 deg bilaterally  SLR laying: Tightness of the left hamstring XSLR laying: Negative  Palpable tenderness: Tender to palpation the paraspinal musculature lumbar spine right greater than left. FABER: Mild positive Pearlean BrownieFaber. Sensory change: Gross sensation intact to all lumbar and sacral dermatomes.  Reflexes: 2+ at both patellar tendons, 2+ at achilles tendons, Babinski's downgoing.  Strength at foot  Plantar-flexion: 5/5 Dorsi-flexion: 5/5 Eversion: 5/5 Inversion: 5/5  Leg strength  Quad: 5/5 Hamstring: 5/5 Hip flexor: 5/5 Hip abductors: 4/5 but symmetric Gait unremarkable.  Osteopathic findings Cervical C2 flexed rotated and side bent right C7 flexed rotated and side bent left T5 extended rotated and side bent right inhaled rib T9 extended rotated and side bent left L4 flexed rotated and side bent right Sacrum left and left     Impression and Recommendations:     This case required medical decision making of moderate complexity.      Note: This dictation was prepared with Dragon dictation along with smaller  phrase technology. Any transcriptional errors that result from this process are unintentional.

## 2018-04-15 ENCOUNTER — Ambulatory Visit: Payer: 59 | Admitting: Family Medicine

## 2018-04-15 ENCOUNTER — Encounter: Payer: Self-pay | Admitting: Family Medicine

## 2018-04-15 VITALS — BP 130/88 | HR 78 | Ht 65.0 in | Wt 243.0 lb

## 2018-04-15 DIAGNOSIS — M999 Biomechanical lesion, unspecified: Secondary | ICD-10-CM | POA: Diagnosis not present

## 2018-04-15 DIAGNOSIS — M4306 Spondylolysis, lumbar region: Secondary | ICD-10-CM | POA: Diagnosis not present

## 2018-04-15 MED ORDER — DOXYCYCLINE HYCLATE 100 MG PO TABS: 100.0000 mg | ORAL_TABLET | Freq: Two times a day (BID) | ORAL | 0 refills | Status: AC

## 2018-04-15 NOTE — Patient Instructions (Signed)
Good to see you  Kristin [name] is your friend.  Lets continue to watch it Make sure multi vitamin has 1000mcg of B12 and 100mg  daily  See me again in 3-4 weeks

## 2018-04-15 NOTE — Assessment & Plan Note (Signed)
Decision today to treat with OMT was based on Physical Exam  After verbal consent patient was treated with HVLA, ME, FPR techniques in cervical, thoracic, lumbar and sacral areas  Patient tolerated the procedure well with improvement in symptoms  Patient given exercises, stretches and lifestyle modifications  See medications in patient instructions if given  Patient will follow up in 4 weeks 

## 2018-04-15 NOTE — Assessment & Plan Note (Signed)
Likely still more the underlying problem.  Did respond somewhat to the sacroiliac joints injection as well as somewhat to the piriformis injection.  Patient has had 3 epidurals.  Wants to avoid any surgical intervention.  Discussed icing regimen and home exercises.  Discussed which activities of doing which wants to avoid again.  Proper lifting mechanics.  Responding to osteopathic manipulation.  Follow-up again in 4 weeks

## 2018-04-18 ENCOUNTER — Encounter (HOSPITAL_COMMUNITY): Payer: Self-pay

## 2018-04-18 ENCOUNTER — Emergency Department (HOSPITAL_COMMUNITY)
Admission: EM | Admit: 2018-04-18 | Discharge: 2018-04-18 | Disposition: A | Discharge status: Home / Self Care | Payer: 59 | Attending: Emergency Medicine | Admitting: Emergency Medicine

## 2018-04-18 ENCOUNTER — Emergency Department (HOSPITAL_COMMUNITY): Payer: 59

## 2018-04-18 ENCOUNTER — Other Ambulatory Visit: Payer: Self-pay

## 2018-04-18 DIAGNOSIS — Z79899 Other long term (current) drug therapy: Secondary | ICD-10-CM | POA: Diagnosis not present

## 2018-04-18 DIAGNOSIS — M545 Low back pain, unspecified: Secondary | ICD-10-CM

## 2018-04-18 DIAGNOSIS — G8929 Other chronic pain: Secondary | ICD-10-CM | POA: Diagnosis not present

## 2018-04-18 MED ORDER — LIDOCAINE 5 % EX PTCH
1.0000 | MEDICATED_PATCH | CUTANEOUS | Status: DC
  Administered 2018-04-18: 1 via TRANSDERMAL
  Filled 2018-04-18: qty 1

## 2018-04-18 MED ORDER — PREDNISONE 50 MG PO TABS: ORAL_TABLET | ORAL | 0 refills | Status: DC

## 2018-04-18 MED ORDER — KETOROLAC TROMETHAMINE 60 MG/2ML IM SOLN
30.0000 mg | Freq: Once | INTRAMUSCULAR | Status: AC
  Administered 2018-04-18: 30 mg via INTRAMUSCULAR
  Filled 2018-04-18: qty 2

## 2018-04-18 MED ORDER — LIDOCAINE 5 % EX PTCH: 1.0000 | MEDICATED_PATCH | CUTANEOUS | 0 refills | Status: DC

## 2018-04-18 MED ORDER — CYCLOBENZAPRINE HCL 10 MG PO TABS
10.0000 mg | ORAL_TABLET | Freq: Once | ORAL | Status: AC
  Administered 2018-04-18: 10 mg via ORAL
  Filled 2018-04-18: qty 1

## 2018-04-18 MED ORDER — METHOCARBAMOL 500 MG PO TABS
500.0000 mg | ORAL_TABLET | Freq: Two times a day (BID) | ORAL | 0 refills | Status: DC
Start: 2018-04-18 — End: 2020-06-13

## 2018-04-18 MED ORDER — HYDROCODONE-ACETAMINOPHEN 5-325 MG PO TABS
1.0000 | ORAL_TABLET | Freq: Once | ORAL | Status: AC
Start: 2018-04-18 — End: 2018-04-18
  Administered 2018-04-18: 1 via ORAL
  Filled 2018-04-18: qty 1

## 2018-04-18 NOTE — ED Provider Notes (Signed)
[facility]-EMERGENCY DEPT Provider Note   CSN: [id number] Arrival date & time: 04/18/18  1652     History   Chief Complaint Chief Complaint  Patient presents with  . Back Pain    HPI Kristin [name] is a 32 y.o. female who presents to the ED with back pain. Patient reports she has been dx with a pars defect of the lumbar spine and the shift has caused significant pain. Patient has had steroid injections and this past week the pain increased and has been taking Neurontin without relief. Patient is to have a f/u appointment but does not have any pain medication to take until follow up.  HPI  Past Medical History:  Diagnosis Date  . History of kidney stones   . Kidney stone   . Kidney stones    Kidney Stones  . Knee joint pain   . Pregnancy induced hypertension   . Syncope     Patient Active Problem List   Diagnosis Date Noted  . SI (sacroiliac) joint dysfunction 03/18/2018  . Piriformis syndrome of left side 12/30/2017  . Nonallopathic lesion of sacral region 07/22/2017  . Nonallopathic lesion of lumbosacral region 07/22/2017  . Nonallopathic lesion of thoracic region 07/22/2017  . 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 Vaginal delivery 04/15/2016  . BMI 40.0-44.9, adult (HCC) 04/14/2016  . Kidney stones--hx  03/24/2016  . History of pregnancy induced hypertension--prior pregnancy 03/24/2016  . Positive GBS test 03/24/2016  . LGA (large for gestational age) fetus--hx with 1st pregnancy, 9+6 03/01/2016  . Allergy to amoxicillin 03/01/2016  . 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 Syncope 08/21/2015  . Hypokalemia 08/21/2015  . Anxiety 08/21/2015    Past Surgical History:  Procedure Laterality Date  . EXTRACORPOREAL SHOCK WAVE LITHOTRIPSY Left 01/30/2017   Procedure: LEFT EXTRACORPOREAL SHOCK WAVE LITHOTRIPSY (ESWL);  Surgeon: Malen GauzeMcKenzie, Patrick L, MD;  Location: [facility];  Service: Urology;  Laterality: Left;  .  KIDNEY STONE SURGERY       OB History    Gravida  4   Para  2   Term  2   Preterm  0   AB  2   Living  2     SAB  2   TAB      Ectopic      Multiple  0   Live Births  1            Home Medications    Prior to Admission medications   Medication Sig Start Date End Date Taking? Authorizing Provider  Ascorbic Acid (VITAMIN C) 500 MG CAPS Take 500 mg by mouth daily.    [provider]  diclofenac sodium (VOLTAREN) 1 % GEL Apply 2 g topically 4 (four) times daily. 12/31/16   Gwyneth SproutPlunkett, Whitney, MD  doxycycline (VIBRA-TABS) 100 MG tablet Take 1 tablet (100 mg total) by mouth 2 (two) times daily for 7 days. 04/15/18 04/22/18  Judi SaaSmith, Zachary M, DO  gabapentin (NEURONTIN) 100 MG capsule Take 2 capsules (200 mg total) by mouth at bedtime. 08/18/17   Judi SaaSmith, Zachary M, DO  HYDROcodone-acetaminophen (NORCO/VICODIN) 5-325 MG tablet Take 1-2 tablets by mouth every 6 (six) hours as needed. 01/11/18   Roxy HorsemanBrowning, Robert, PA-C  lidocaine (LIDODERM) 5 % Place 1 patch onto the skin daily. Remove & Discard patch within 12 hours or as directed by MD 04/18/18   Janne NapoleonNeese, Hope M, NP  methocarbamol (ROBAXIN) 500 MG tablet Take 1 tablet (500 mg  total) by mouth 2 (two) times daily. 04/18/18   Janne Napoleon, NP  Misc Natural Products (TART CHERRY ADVANCED PO) Take 3 tablets by mouth daily.    [provider]  Multiple Vitamins-Minerals (MULTIVITAMIN ADULT PO) Take 1 tablet by mouth daily.    [provider]  ondansetron (ZOFRAN ODT) 4 MG disintegrating tablet Take 1 tablet (4 mg total) by mouth every 8 (eight) hours as needed for nausea or vomiting. 01/15/17   Alvira Monday, MD  predniSONE (DELTASONE) 50 MG tablet Take one tablet daily at breakfast 04/18/18   Damian Leavell, [place], NP  Turmeric 500 MG CAPS Take 500 mg by mouth daily.    [provider]  venlafaxine XR (EFFEXOR XR) 150 MG 24 hr capsule Take 1 capsule (150 mg total) by mouth daily with breakfast. 01/08/18   [name], DO  Vitamin D, Ergocalciferol, (DRISDOL) 50000 units CAPS capsule TAKE 1 CAPSULE (50,000 UNITS TOTAL) BY MOUTH EVERY 7 (SEVEN) DAYS. 08/25/17   [name], DO    Family History Family History  Problem Relation Age of Onset  . CVA Mother   . Hypertension Mother   . Diabetes Mellitus II Mother   . Congestive Heart Failure Mother   . CVA Father   . Hypertension Father   . Diabetes Mellitus II Father     Social History Social History   Tobacco Use  . Smoking status: Never Smoker  . Smokeless tobacco: Never Used  Substance Use Topics  . Alcohol use: No  . Drug use: No     Allergies   Amoxicillin   Review of Systems Review of Systems  Musculoskeletal: Positive for back pain.  All other systems reviewed and are negative.    Physical Exam Updated Vital Signs BP (!) 172/102 (BP Location: Left Arm) Comment: checked twice, pt crying  Pulse 78   Temp 98.5 F (36.9 C) (Oral)   Resp 20   Ht 5\' 5"  (1.651 m)   Wt 110.2 kg (243 lb)   LMP 03/28/2018   SpO2 99%   BMI 40.44 kg/m   Physical Exam  Constitutional: She appears well-developed and well-nourished. No distress.  HENT:  Head: Normocephalic.  Eyes: EOM are normal.  Neck: Neck supple.  Cardiovascular: Normal rate.  Pulmonary/Chest: Effort normal.  Musculoskeletal:       Lumbar back: She exhibits tenderness and spasm. She exhibits no deformity and normal pulse. Decreased range of motion: due to pain.  Neurological: She is alert. She has normal strength. No sensory deficit.  Reflex Scores:      Bicep reflexes are 2+ on the right side and 2+ on the left side.      Brachioradialis reflexes are 2+ on the right side and 2+ on the left side.      Patellar reflexes are 2+ on the right side and 2+ on the left side. No foot drag.   Skin: Skin is warm and dry.  Psychiatric: She has a normal mood and affect. Her behavior is normal.  Nursing note and vitals reviewed.    ED Treatments / Results   Labs (all labs ordered are listed, but only abnormal results are displayed) Labs Reviewed  POC URINE PREG, ED    EKG None  Radiology Ct Lumbar Spine Wo Contrast  Result Date: 04/18/2018 CLINICAL DATA:  Back pain for over 6 weeks.  Pars defects. EXAM: CT LUMBAR SPINE WITHOUT CONTRAST TECHNIQUE: Multidetector CT imaging of the lumbar spine was performed without intravenous  contrast administration. Multiplanar CT image reconstructions were also generated. COMPARISON:  05/04/2017 FINDINGS: Segmentation: 5 lumbar type vertebral bodies. Alignment: Normal Vertebrae: Chronic bilateral L5 pars defects. Chronic, developmental appearing bony defect in the T12 left inferior articular process. No evidence of fracture, discitis, or aggressive bone lesion. Vacuum phenomenon in the bilateral sacroiliac joints without spurring or sclerosis. No erosive changes Paraspinal and other soft tissues: 6 mm left renal calculus. Disc levels: L5-S1 mild disc bulging. No visible herniation or impingement IMPRESSION: 1. No acute finding or change from 2018. 2. L5-S1 mild disc bulging. 3. 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 4. Left nephrolithiasis. Electronically Signed   By: Marnee Spring M.D.   On: 04/18/2018 19:47    Procedures Procedures (including critical care time)  Medications Ordered in ED Medications  lidocaine (LIDODERM) 5 % 1 patch (has no administration in time range)  ketorolac (TORADOL) injection 30 mg (has no administration in time range)  cyclobenzaprine (FLEXERIL) tablet 10 mg (10 mg Oral Given 04/18/18 1800)  HYDROcodone-acetaminophen (NORCO/VICODIN) 5-325 MG per tablet 1 tablet (1 tablet Oral Given 04/18/18 1800)     Initial Impression / Assessment and Plan / ED Course  I have reviewed the triage vital signs and the nursing notes. Patient with back pain.  No neurological deficits and normal neuro exam.  Patient can walk but states is painful.  No loss of bowel or bladder  control.  No concern for cauda equina.  No fever, night sweats, weight loss, h/o cancer, IVDU.  RICE protocol and pain medicine indicated and discussed with patient. Pain improved with treatment while in the ED. Patient appears stable for d/c. Return precautions discussed.   Final Clinical Impressions(s) / ED Diagnoses   Final diagnoses:  Acute exacerbation of chronic low back pain    ED Discharge Orders        Ordered    methocarbamol (ROBAXIN) 500 MG tablet  2 times daily     04/18/18 2013    lidocaine (LIDODERM) 5 %  Every 24 hours     04/18/18 2013    predniSONE (DELTASONE) 50 MG tablet     04/18/18 2013       Kerrie Buffalo [place], [place] 04/18/18 2017    Loren Racer, MD 04/18/18 2142

## 2018-04-18 NOTE — Discharge Instructions (Addendum)
The muscle relaxer can make you sleepy so do not drive while taking it. Follow up with your doctor to discuss long term plan for your back and pain management.

## 2018-04-18 NOTE — ED Triage Notes (Signed)
Pt reports she has been dx with a pars defect of lumbar spine that has shifted and caused significant pain. She is supposed to have a follow up appointment, but was not given anything for pain in the mean time. Pt tearful in triage. Pt ambulatory, PMS intact

## 2018-04-18 NOTE — ED Notes (Signed)
Patient transported to CT 

## 2018-04-23 ENCOUNTER — Telehealth: Payer: Self-pay | Admitting: *Deleted

## 2018-04-23 DIAGNOSIS — M4306 Spondylolysis, lumbar region: Secondary | ICD-10-CM

## 2018-04-23 MED ORDER — PREDNISONE 50 MG PO TABS: ORAL_TABLET | ORAL | 0 refills | Status: DC

## 2018-04-23 NOTE — Telephone Encounter (Signed)
I would either discuss with Dr. Ollen BowlHarkins and see if any other injections are better, or referral to nuerosurgery to discuss surgical options.

## 2018-04-23 NOTE — Telephone Encounter (Signed)
Spoke with pt, she would like to go ahead with the referral to neurosurgery.   Pt asked in the meantime is there anything you can do to help with the pain. Pt states she has stopped taking the gabapentin because she did not feel that it was making a difference.

## 2018-04-23 NOTE — Telephone Encounter (Signed)
Sent in 5 more days of prednisone, have her take the robaxin given to her at night contine the effexor  Tylenol 650 mg 3 times a day

## 2018-04-23 NOTE — Telephone Encounter (Signed)
Copied from CRM [phone]#[id number]. Topic: Quick Communication - See Telephone Encounter >> Apr 23, 2018  8:42 AM Mare LoanBurton, Donna F wrote: Pt would like to know what Dr. Katrinka BlazingSmith is wanting to do for her back pain that does not seem to be getting better   Best number [phone]

## 2018-04-24 NOTE — Telephone Encounter (Signed)
Discussed with pt

## 2018-04-24 NOTE — Telephone Encounter (Signed)
Called pt, unable to leave a message

## 2018-05-11 NOTE — Progress Notes (Signed)
Kristin [name] D.O. [facility] [address] [place], [place] [address] Phone: [phone] Subjective:      CC: Back pain  BJY:[id number]:Subjective  Kristin Elvera BickerG Arrasmith is a 32 y.o. female coming in with complaint of neck pain.  Patient has had more of an L5 pars defect that has contributed to nerve impingement causing radicular symptoms and left-sided weakness.  Patient did respond initially to epidurals but is no longer.  Patient has seen another provider now to discuss other type of injections and is going to be having a CT myelogram.  Continues to have discomfort and pain.  Patient states that the manipulation is very helpful.  Patient rates the severity pain is 7 out of 10.  Continues the same medications    Past Medical History:  Diagnosis Date  . History of kidney stones   . Kidney stone   . Kidney stones    Kidney Stones  . Knee joint pain   . Pregnancy induced hypertension   . Syncope    Past Surgical History:  Procedure Laterality Date  . EXTRACORPOREAL SHOCK WAVE LITHOTRIPSY Left 01/30/2017   Procedure: LEFT EXTRACORPOREAL SHOCK WAVE LITHOTRIPSY (ESWL);  Surgeon: Kristin [name], Patrick L, MD;  Location: [facility];  Service: Urology;  Laterality: Left;  . KIDNEY STONE SURGERY     Social History   Socioeconomic History  . Marital status: Married    Spouse name: Not on file  . Number of children: Not on file  . Years of education: Not on file  . Highest education level: Not on file  Occupational History  . Not on file  Social Needs  . Financial resource strain: Not on file  . Food insecurity:    Worry: Not on file    Inability: Not on file  . Transportation needs:    Medical: Not on file    Non-medical: Not on file  Tobacco Use  . Smoking status: Never Smoker  . Smokeless tobacco: Never Used  Substance and Sexual Activity  . Alcohol use: No  . Drug use: No  . Sexual activity: Not on file  Lifestyle  . Physical activity:    Days per week: Not on file    Minutes per  session: Not on file  . Stress: Not on file  Relationships  . Social connections:    Talks on phone: Not on file    Gets together: Not on file    Attends religious service: Not on file    Active member of club or organization: Not on file    Attends meetings of clubs or organizations: Not on file    Relationship status: Not on file  Other Topics Concern  . Not on file  Social History Narrative  . Not on file   Allergies  Allergen Reactions  . Amoxicillin Hives    Has patient had a PCN reaction causing immediate rash, facial/tongue/throat swelling, SOB or lightheadedness with hypotension: no Has patient had a PCN reaction causing severe rash involving mucus membranes or skin necrosis: No Has patient had a PCN reaction that required hospitalization No Has patient had a PCN reaction occurring within the last 10 years: No If all of the above answers are "NO", then may proceed with Cephalosporin use.   Family History  Problem Relation Age of Onset  . CVA Mother   . Hypertension Mother   . Diabetes Mellitus II Mother   . Congestive Heart Failure Mother   . CVA Father   . Hypertension  Father   . Diabetes Mellitus II Father      Past medical history, social, surgical and family history all reviewed in electronic medical record.  No pertanent information unless stated regarding to the chief complaint.   Review of Systems:Review of systems updated and as accurate as of 05/13/18  No headache, visual changes, nausea, vomiting, diarrhea, constipation, dizziness, abdominal pain, skin rash, fevers, chills, night sweats, weight loss, swollen lymph nodes, body aches, joint swelling,chest pain, shortness of breath, mood changes.  Positive muscle aches  Objective  Blood pressure 140/90, height 5\' 5"  (1.651 m), weight 248 lb (112.5 kg), currently breastfeeding. Systems examined below as of 05/13/18   General: No apparent distress alert and oriented x3 mood and affect normal, dressed  appropriately.  HEENT: Pupils equal, extraocular movements intact  Respiratory: Patient's speak in full sentences and does not appear short of breath  Cardiovascular: No lower extremity edema, non tender, no erythema  Skin: Warm dry intact with no signs of infection or rash on extremities or on axial skeleton.  Abdomen: Soft nontender  Neuro: Cranial nerves II the 2+ pulses.  Lymph: No lymphadenopathy of posterior or anterior cervical chain or axillae bilaterally.  Gait normal with good balance and coordination.  MSK:  Non tender with full range of motion and good stability and symmetric strength and tone of shoulders, elbows, wrist, hip, knee and ankles bilaterally.   Neck exam shows some mild loss of lordosis.  Tenderness to palpation from the L4-S1 paraspinal musculature bilaterally.  Patient has worsening pain with extension and lacks last 10 degrees of extension of the back.  Mild pain with left-sided rotation and sidebending as well.  Positive Faber on the left side.  Mild positive straight leg test as well.  4+ out of 5 strength compared to the contralateral side on the left side.  Deep tendon reflexes of the patella 1+ compared to the contralateral side    Osteopathic findings  T6 extended rotated and side bent left L2 flexed rotated and side bent right Sacrum left on left     Impression and Recommendations:     This case required medical decision making of moderate complexity.      Note: This dictation was prepared with Dragon dictation along with smaller phrase technology. Any transcriptional errors that result from this process are unintentional.

## 2018-05-13 ENCOUNTER — Encounter: Payer: Self-pay | Admitting: Family Medicine

## 2018-05-13 ENCOUNTER — Ambulatory Visit: Payer: 59 | Admitting: Family Medicine

## 2018-05-13 VITALS — BP 140/90 | Ht 65.0 in | Wt 248.0 lb

## 2018-05-13 DIAGNOSIS — M9903 Segmental and somatic dysfunction of lumbar region: Secondary | ICD-10-CM

## 2018-05-13 DIAGNOSIS — M999 Biomechanical lesion, unspecified: Secondary | ICD-10-CM | POA: Diagnosis not present

## 2018-05-13 DIAGNOSIS — M9904 Segmental and somatic dysfunction of sacral region: Secondary | ICD-10-CM | POA: Diagnosis not present

## 2018-05-13 DIAGNOSIS — M9902 Segmental and somatic dysfunction of thoracic region: Secondary | ICD-10-CM | POA: Diagnosis not present

## 2018-05-13 DIAGNOSIS — M4306 Spondylolysis, lumbar region: Secondary | ICD-10-CM | POA: Diagnosis not present

## 2018-05-13 NOTE — Patient Instructions (Signed)
Gardening or kneeling bench could be great  Continue the meds you are taking.  Keep trying to be active.  I am glad Dr. Ollen BowlHarkins is trying to find something  See me again in 4-6 weeks If you need anything call [phone]

## 2018-05-13 NOTE — Assessment & Plan Note (Signed)
Pars defect.  Patient is having a CT myelogram in the near future.  Patient may be a candidate for possibly other treatment such as radiofrequency ablation and will discuss with their other provider.  Will continue osteopathic manipulation and same medications at this time.  We discussed other ergonomics at work that could be beneficial.  Follow-up again in 4 to 6 weeks

## 2018-05-13 NOTE — Assessment & Plan Note (Signed)
Decision today to treat with OMT was based on Physical Exam  After verbal consent patient was treated with HVLA, ME, FPR techniques in  thoracic, lumbar and sacral areas  Patient tolerated the procedure well with improvement in symptoms  Patient given exercises, stretches and lifestyle modifications  See medications in patient instructions if given  Patient will follow up in 4 weeks 

## 2018-06-16 ENCOUNTER — Ambulatory Visit: Payer: 59 | Admitting: Family Medicine

## 2018-06-16 ENCOUNTER — Encounter: Payer: Self-pay | Admitting: Family Medicine

## 2018-06-16 VITALS — BP 100/70 | HR 71 | Ht 65.0 in | Wt 245.0 lb

## 2018-06-16 DIAGNOSIS — M999 Biomechanical lesion, unspecified: Secondary | ICD-10-CM | POA: Diagnosis not present

## 2018-06-16 DIAGNOSIS — M4306 Spondylolysis, lumbar region: Secondary | ICD-10-CM | POA: Diagnosis not present

## 2018-06-16 DIAGNOSIS — M533 Sacrococcygeal disorders, not elsewhere classified: Secondary | ICD-10-CM

## 2018-06-16 MED ORDER — LEVOTHYROXINE SODIUM 50 MCG PO TABS: 50.0000 ug | ORAL_TABLET | Freq: Every day | ORAL | 3 refills | Status: DC

## 2018-06-16 NOTE — Patient Instructions (Signed)
Good to see you  Ice 20 minutes 2 times daily. Usually after activity and before bed. Synthroid 50 mcg daily  Continue the other meds.  I will send a message to Dr. Ollen BowlHarkins and see if I can find out mor eon the injection  See me again in 4-8 weeks

## 2018-06-16 NOTE — Assessment & Plan Note (Signed)
Decision today to treat with OMT was based on Physical Exam  After verbal consent patient was treated with HVLA, ME, FPR techniques in  thoracic, lumbar and sacral areas  Patient tolerated the procedure well with improvement in symptoms  Patient given exercises, stretches and lifestyle modifications  See medications in patient instructions if given  Patient will follow up in 4-8 weeks 

## 2018-06-16 NOTE — Assessment & Plan Note (Signed)
Patient does have sacroiliac dysfunction, piriformis syndrome as well as a lumbar radiculopathy.  Continue to try osteopathic manipulation.  Discussed the importance of core strengthening and weight loss.  Patient is going to be following up with Dr. Ollen BowlHarkins for different types of injections.  We will try to get patient's information to see what type of injections they are doing.  Follow-up with me again in 4 to 8 weeks

## 2018-06-16 NOTE — Assessment & Plan Note (Signed)
Patient is undergoing a different type of injection.  We will see how patient responds.  Could be a candidate for possible radiofrequency ablation if patient wants to avoid surgical intervention if possible

## 2018-06-16 NOTE — Progress Notes (Signed)
Kristin ScaleZach Alajah Witman D.O. [facility] [address] [place], [place] [address] Phone: [phone] Subjective:    I Ronelle NighKana [name] am serving as a Neurosurgeonscribe for Dr. Antoine PrimasZachary Kysean Sweet.    CC: Back pain  ION:[id number]:Subjective  Helem Elvera BickerG Tabb is a 32 y.o. female coming in with complaint of back pain. States that the back is not so bad today.  Patient though is going to be undergoing another type of injection.  Does not know why.  Is seeing Dr. Ollen BowlHarkins.  Has had an exacerbation where patient did have to go to the emergency room again.  No significant association that she can think of.  Intermittent pain going down the leg still.  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     Past Medical History:  Diagnosis Date  . History of kidney stones   . Kidney stone   . Kidney stones    Kidney Stones  . Knee joint pain   . Pregnancy induced hypertension   . Syncope    Past Surgical History:  Procedure Laterality Date  . EXTRACORPOREAL SHOCK WAVE LITHOTRIPSY Left 01/30/2017   Procedure: LEFT EXTRACORPOREAL SHOCK WAVE LITHOTRIPSY (ESWL);  Surgeon: Malen GauzeMcKenzie, Patrick L, MD;  Location: [facility];  Service: Urology;  Laterality: Left;  . KIDNEY STONE SURGERY     Social History   Socioeconomic History  . Marital status: Married    Spouse name: Not on file  . Number of children: Not on file  . Years of education: Not on file  . Highest education level: Not on file  Occupational History  . Not on file  Social Needs  . Financial resource strain: Not on file  . Food insecurity:    Worry: Not on file    Inability: Not on file  . Transportation needs:    Medical: Not on file    Non-medical: Not on file  Tobacco Use  . Smoking status: Never Smoker  . Smokeless tobacco: Never Used  Substance and Sexual Activity  . Alcohol use: No  . Drug use: No  . Sexual activity: Not on file  Lifestyle  . Physical activity:    Days per week: Not on file    Minutes per session: Not on file  . Stress: Not on file  Relationships  .  Social connections:    Talks on phone: Not on file    Gets together: Not on file    Attends religious service: Not on file    Active member of club or organization: Not on file    Attends meetings of clubs or organizations: Not on file    Relationship status: Not on file  Other Topics Concern  . Not on file  Social History Narrative  . Not on file   Allergies  Allergen Reactions  . Amoxicillin Hives    Has patient had a PCN reaction causing immediate rash, facial/tongue/throat swelling, SOB or lightheadedness with hypotension: no Has patient had a PCN reaction causing severe rash involving mucus membranes or skin necrosis: No Has patient had a PCN reaction that required hospitalization No Has patient had a PCN reaction occurring within the last 10 years: No If all of the above answers are "NO", then may proceed with Cephalosporin use.   Family History  Problem Relation Age of Onset  . CVA Mother   . Hypertension Mother   . Diabetes Mellitus II Mother   . Congestive Heart Failure Mother   . CVA Father   . Hypertension Father   .  Diabetes Mellitus II Father     Current Outpatient Medications (Endocrine & Metabolic):  .  predniSONE (DELTASONE) 50 MG tablet, Take one tablet daily at breakfast .  levothyroxine (SYNTHROID, LEVOTHROID) 50 MCG tablet, Take 1 tablet (50 mcg total) by mouth daily.    Current Outpatient Medications (Analgesics):  .  HYDROcodone-acetaminophen (NORCO/VICODIN) 5-325 MG tablet, Take 1-2 tablets by mouth every 6 (six) hours as needed.   Current Outpatient Medications (Other):  Marland Kitchen  Ascorbic Acid (VITAMIN C) 500 MG CAPS, Take 500 mg by mouth daily. .  diclofenac sodium (VOLTAREN) 1 % GEL, Apply 2 g topically 4 (four) times daily. Marland Kitchen  gabapentin (NEURONTIN) 100 MG capsule, Take 2 capsules (200 mg total) by mouth at bedtime. .  lidocaine (LIDODERM) 5 %, Place 1 patch onto the skin daily. Remove & Discard patch within 12 hours or as directed by MD .   methocarbamol (ROBAXIN) 500 MG tablet, Take 1 tablet (500 mg total) by mouth 2 (two) times daily. .  Misc Natural Products (TART CHERRY ADVANCED PO), Take 3 tablets by mouth daily. .  Multiple Vitamins-Minerals (MULTIVITAMIN ADULT PO), Take 1 tablet by mouth daily. .  ondansetron (ZOFRAN ODT) 4 MG disintegrating tablet, Take 1 tablet (4 mg total) by mouth every 8 (eight) hours as needed for nausea or vomiting. .  Turmeric 500 MG CAPS, Take 500 mg by mouth daily. Marland Kitchen  venlafaxine XR (EFFEXOR XR) 150 MG 24 hr capsule, Take 1 capsule (150 mg total) by mouth daily with breakfast. .  Vitamin D, Ergocalciferol, (DRISDOL) 50000 units CAPS capsule, TAKE 1 CAPSULE (50,000 UNITS TOTAL) BY MOUTH EVERY 7 (SEVEN) DAYS.    Past medical history, social, surgical and family history all reviewed in electronic medical record.  No pertanent information unless stated regarding to the chief complaint.   Review of Systems:  No headache, visual changes, nausea, vomiting, diarrhea, constipation, dizziness, abdominal pain, skin rash, fevers, chills, night sweats, weight loss, swollen lymph nodes, body aches, joint swelling, , chest pain, shortness of breath, mood changes.  Positive muscle aches  Objective  Blood pressure 100/70, pulse 71, height 5\' 5"  (1.651 m), weight 245 lb (111.1 kg), SpO2 98 %, currently breastfeeding.   General: No apparent distress alert and oriented x3 mood and affect normal, dressed appropriately.  HEENT: Pupils equal, extraocular movements intact  Respiratory: Patient's speak in full sentences and does not appear short of breath  Cardiovascular: No lower extremity edema, non tender, no erythema  Skin: Warm dry intact with no signs of infection or rash on extremities or on axial skeleton.  Abdomen: Soft nontender  Neuro: Cranial nerves II through XII are intact, neurovascularly intact in all extremities with 2+ DTRs and 2+ pulses.  Lymph: No lymphadenopathy of posterior or anterior cervical  chain or axillae bilaterally.  Gait normal with good balance and coordination.  MSK:  Non tender with full range of motion and good stability and symmetric strength and tone of shoulders, elbows, wrist, hip, knee and ankles bilaterally.  Back Exam:  Inspection: Loss of lordosis Motion: Flexion 45 deg, Extension 20 deg, Side Bending to 35 deg bilaterally,  Rotation to 35 deg bilaterally  SLR laying: Mild positive noted with significant tightness bilaterally XSLR laying: Negative  Palpable tenderness: Tender to palpation the paraspinal musculature lumbar spine right greater than left. FABER: Tightness bilaterally. Sensory change: Gross sensation intact to all lumbar and sacral dermatomes.  Reflexes: 2+ at both patellar tendons, 2+ at achilles tendons, Babinski's downgoing.  Strength at  foot  Plantar-flexion: 5/5 Dorsi-flexion: 5/5 Eversion: 5/5 Inversion: 5/5  Leg strength  Quad: 5/5 Hamstring: 5/5 Hip flexor: 5/5 Hip abductors: 5/5  Gait unremarkable.  Osteopathic findings T6 extended rotated and side bent left L2 flexed rotated and side bent right Sacrum right on right     Impression and Recommendations:     This case required medical decision making of moderate complexity. The above documentation has been reviewed and is accurate and complete [name], DO       Note: This dictation was prepared with Dragon dictation along with smaller phrase technology. Any transcriptional errors that result from this process are unintentional.

## 2018-06-30 NOTE — Progress Notes (Signed)
[name] [address] [place], [place] [address] Phone: [phone] Subjective:    I [name] am serving as a Neurosurgeon for Dr. Antoine Primas.   I'm seeing this patient by the request  of:    CC: Right knee pain  BJY:[id number]  Kristin [name] is a 32 y.o. female coming in with complaint of right knee pain. Started working out at [employer] Swelling. Popping with walking. No numbness and tingling noted. Pain radiates up the leg and stops at the thigh.  Onset- 9/28 Location- Medial Character- Sharp, achy, dull Aggravating factors- Walking  Reliving factors-  Therapies tried- Ice, compression Severity-initially 8 out of 10 but slowly improving     Past Medical History:  Diagnosis Date  . History of kidney stones   . Kidney stone   . Kidney stones    Kidney Stones  . Knee joint pain   . Pregnancy induced hypertension   . Syncope    Past Surgical History:  Procedure Laterality Date  . EXTRACORPOREAL SHOCK WAVE LITHOTRIPSY Left 01/30/2017   Procedure: LEFT EXTRACORPOREAL SHOCK WAVE LITHOTRIPSY (ESWL);  Surgeon: Malen Gauze, MD;  Location: [facility];  Service: Urology;  Laterality: Left;  . KIDNEY STONE SURGERY     Social History   Socioeconomic History  . Marital status: Married    Spouse name: Not on file  . Number of children: Not on file  . Years of education: Not on file  . Highest education level: Not on file  Occupational History  . Not on file  Social Needs  . Financial resource strain: Not on file  . Food insecurity:    Worry: Not on file    Inability: Not on file  . Transportation needs:    Medical: Not on file    Non-medical: Not on file  Tobacco Use  . Smoking status: Never Smoker  . Smokeless tobacco: Never Used  Substance and Sexual Activity  . Alcohol use: No  . Drug use: No  . Sexual activity: Not on file  Lifestyle  . Physical activity:    Days per week: Not on file    Minutes per session: Not on  file  . Stress: Not on file  Relationships  . Social connections:    Talks on phone: Not on file    Gets together: Not on file    Attends religious service: Not on file    Active member of club or organization: Not on file    Attends meetings of clubs or organizations: Not on file    Relationship status: Not on file  Other Topics Concern  . Not on file  Social History Narrative  . Not on file   Allergies  Allergen Reactions  . Amoxicillin Hives    Has patient had a PCN reaction causing immediate rash, facial/tongue/throat swelling, SOB or lightheadedness with hypotension: no Has patient had a PCN reaction causing severe rash involving mucus membranes or skin necrosis: No Has patient had a PCN reaction that required hospitalization No Has patient had a PCN reaction occurring within the last 10 years: No If all of the above answers are "NO", then may proceed with Cephalosporin use.   Family History  Problem Relation Age of Onset  . CVA Mother   . Hypertension Mother   . Diabetes Mellitus II Mother   . Congestive Heart Failure Mother   . CVA Father   . Hypertension Father   . Diabetes Mellitus II  Father     Current Outpatient Medications (Endocrine & Metabolic):  .  levothyroxine (SYNTHROID, LEVOTHROID) 50 MCG tablet, Take 1 tablet (50 mcg total) by mouth daily. .  predniSONE (DELTASONE) 50 MG tablet, Take one tablet daily at breakfast    Current Outpatient Medications (Analgesics):  .  HYDROcodone-acetaminophen (NORCO/VICODIN) 5-325 MG tablet, Take 1-2 tablets by mouth every 6 (six) hours as needed.   Current Outpatient Medications (Other):  Marland Kitchen  Ascorbic Acid (VITAMIN C) 500 MG CAPS, Take 500 mg by mouth daily. .  diclofenac sodium (VOLTAREN) 1 % GEL, Apply 2 g topically 4 (four) times daily. Marland Kitchen  gabapentin (NEURONTIN) 100 MG capsule, Take 2 capsules (200 mg total) by mouth at bedtime. .  lidocaine (LIDODERM) 5 %, Place 1 patch onto the skin daily. Remove & Discard patch  within 12 hours or as directed by MD .  methocarbamol (ROBAXIN) 500 MG tablet, Take 1 tablet (500 mg total) by mouth 2 (two) times daily. .  Misc Natural Products (TART CHERRY ADVANCED PO), Take 3 tablets by mouth daily. .  Multiple Vitamins-Minerals (MULTIVITAMIN ADULT PO), Take 1 tablet by mouth daily. .  ondansetron (ZOFRAN ODT) 4 MG disintegrating tablet, Take 1 tablet (4 mg total) by mouth every 8 (eight) hours as needed for nausea or vomiting. .  Turmeric 500 MG CAPS, Take 500 mg by mouth daily. Marland Kitchen  venlafaxine XR (EFFEXOR XR) 150 MG 24 hr capsule, Take 1 capsule (150 mg total) by mouth daily with breakfast. .  Vitamin D, Ergocalciferol, (DRISDOL) 50000 units CAPS capsule, TAKE 1 CAPSULE (50,000 UNITS TOTAL) BY MOUTH EVERY 7 (SEVEN) DAYS. .  Diclofenac Sodium (PENNSAID) 2 % SOLN, Place 2 g onto the skin 2 (two) times daily.    Past medical history, social, surgical and family history all reviewed in electronic medical record.  No pertanent information unless stated regarding to the chief complaint.   Review of Systems:  No headache, visual changes, nausea, vomiting, diarrhea, constipation, dizziness, abdominal pain, skin rash, fevers, chills, night sweats, weight loss, swollen lymph nodes, body aches, joint swelling,  chest pain, shortness of breath, mood changes.  Positive muscle aches  Objective  Blood pressure 134/90, pulse 77, height 5\' 5"  (1.651 m), weight 250 lb (113.4 kg), SpO2 93 %, currently breastfeeding.   General: No apparent distress alert and oriented x3 mood and affect normal, dressed appropriately.  HEENT: Pupils equal, extraocular movements intact  Respiratory: Patient's speak in full sentences and does not appear short of breath  Cardiovascular: No lower extremity edema, non tender, no erythema  Skin: Warm dry intact with no signs of infection or rash on extremities or on axial skeleton.  Abdomen: Soft nontender  Neuro: Cranial nerves II through XII are intact,  neurovascularly intact in all extremities with 2+ DTRs and 2+ pulses.  Lymph: No lymphadenopathy of posterior or anterior cervical chain or axillae bilaterally.  Gait antalgic MSK:  Non tender with full range of motion and good stability and symmetric strength and tone of shoulders, elbows, wrist, hip, and ankles bilaterally. Knee: Right Normal to inspection with no erythema or effusion or obvious bony abnormalities. Tender to palpation over the.  Patella mostly in the medial aspect lateral tracking noted. ROM full in flexion and extension and lower leg rotation. Ligaments with solid consistent endpoints including ACL, PCL, LCL, MCL. Negative Mcmurray's, Apley's, and Thessalonian tests. painful patellar compression. Patellar glide with mild crepitus. Patellar and quadriceps tendons unremarkable. Hamstring and quadriceps strength is normal. Contralateral knee unremarkable  MSK US performed of: Right knee This study was ordered, performed, and interpreted by Terrilee Files D.O.  Knee: All structures visualized. Anteromedial, anterolateral, posteromedial, and posterolateral menisci unremarkable without tearing, fraying, effusion, or displacement.  Patient does have what appears to be a medial plica noted.  Increased Doppler flow. Patellar Tendon unremarkable on long and transverse views without effusion. No abnormality of prepatellar bursa. LCL and MCL unremarkable on long and transverse views. No abnormality of origin of medial or lateral head of the gastrocnemius.  IMPRESSION: Plica medial superior   Impression and Recommendations:     This case required medical decision making of moderate complexity. The above documentation has been reviewed and is accurate and complete [name], DO       Note: This dictation was prepared with Dragon dictation along with smaller phrase technology. Any transcriptional errors that result from this process are unintentional.

## 2018-07-03 ENCOUNTER — Ambulatory Visit: Payer: 59 | Admitting: Family Medicine

## 2018-07-03 ENCOUNTER — Encounter: Payer: Self-pay | Admitting: Family Medicine

## 2018-07-03 ENCOUNTER — Ambulatory Visit: Payer: Self-pay

## 2018-07-03 VITALS — BP 134/90 | HR 77 | Ht 65.0 in | Wt 250.0 lb

## 2018-07-03 DIAGNOSIS — G8929 Other chronic pain: Secondary | ICD-10-CM

## 2018-07-03 DIAGNOSIS — Z96651 Presence of right artificial knee joint: Principal | ICD-10-CM

## 2018-07-03 DIAGNOSIS — M6751 Plica syndrome, right knee: Secondary | ICD-10-CM | POA: Insufficient documentation

## 2018-07-03 DIAGNOSIS — M25561 Pain in right knee: Secondary | ICD-10-CM

## 2018-07-03 MED ORDER — DICLOFENAC SODIUM 2 % TD SOLN: 2.0000 g | Freq: Two times a day (BID) | TRANSDERMAL | 3 refills | Status: DC

## 2018-07-03 NOTE — Assessment & Plan Note (Signed)
Plica syndrome noted.  Discussed with patient in great length.  Discussed bracing, we discussed home exercises, discussed that possible injections may be necessary.  Would like to avoid any surgical intervention.  Differential includes more of a patellofemoral syndrome or mild subluxation.  Did not see any significant articular changes though on the ultrasound today.  Discussed icing regimen and home exercises.  Follow-up again in 4 weeks

## 2018-07-03 NOTE — Patient Instructions (Signed)
Plica syndrome Exercises 3 times a week.  pennsaid pinkie amount topically 2 times daily as needed.  Ice 20 minutes 2 times daily. Usually after activity and before bed. Ok to work out but monitor symptoms If worsening may need injection  See me again in 4 weeks for your manipulation anyhow.  You are awesome!

## 2018-07-24 NOTE — Progress Notes (Signed)
[name] [address] [place], [place] [address] Phone: [phone] Subjective:    I Kristin [name] am serving as a Neurosurgeon for Dr. Antoine Primas.    CC: Right knee pain, back pain  BJY:NWGNFAOZHY  Kristin [name] is a 32 y.o. female coming in with complaint of right knee pain. Knee is doing ok.  Still feels that the kneecap does have anything from time to time.  Back is doing a lot better. Epidural injection last week.  Patient was actually given a nerve root injection.  Feeling significantly better.  Feels like this 1 has helped more than the other one.  Continues to do the physical therapy.       Past Medical History:  Diagnosis Date  . History of kidney stones   . Kidney stone   . Kidney stones    Kidney Stones  . Knee joint pain   . Pregnancy induced hypertension   . Syncope    Past Surgical History:  Procedure Laterality Date  . EXTRACORPOREAL SHOCK WAVE LITHOTRIPSY Left 01/30/2017   Procedure: LEFT EXTRACORPOREAL SHOCK WAVE LITHOTRIPSY (ESWL);  Surgeon: Malen Gauze, MD;  Location: [facility];  Service: Urology;  Laterality: Left;  . KIDNEY STONE SURGERY     Social History   Socioeconomic History  . Marital status: Married    Spouse name: Not on file  . Number of children: Not on file  . Years of education: Not on file  . Highest education level: Not on file  Occupational History  . Not on file  Social Needs  . Financial resource strain: Not on file  . Food insecurity:    Worry: Not on file    Inability: Not on file  . Transportation needs:    Medical: Not on file    Non-medical: Not on file  Tobacco Use  . Smoking status: Never Smoker  . Smokeless tobacco: Never Used  Substance and Sexual Activity  . Alcohol use: No  . Drug use: No  . Sexual activity: Not on file  Lifestyle  . Physical activity:    Days per week: Not on file    Minutes per session: Not on file  . Stress: Not on file  Relationships  . Social  connections:    Talks on phone: Not on file    Gets together: Not on file    Attends religious service: Not on file    Active member of club or organization: Not on file    Attends meetings of clubs or organizations: Not on file    Relationship status: Not on file  Other Topics Concern  . Not on file  Social History Narrative  . Not on file   Allergies  Allergen Reactions  . Amoxicillin Hives    Has patient had a PCN reaction causing immediate rash, facial/tongue/throat swelling, SOB or lightheadedness with hypotension: no Has patient had a PCN reaction causing severe rash involving mucus membranes or skin necrosis: No Has patient had a PCN reaction that required hospitalization No Has patient had a PCN reaction occurring within the last 10 years: No If all of the above answers are "NO", then may proceed with Cephalosporin use.   Family History  Problem Relation Age of Onset  . CVA Mother   . Hypertension Mother   . Diabetes Mellitus II Mother   . Congestive Heart Failure Mother   . CVA Father   . Hypertension Father   . Diabetes Mellitus II  Father     Current Outpatient Medications (Endocrine & Metabolic):  .  levothyroxine (SYNTHROID, LEVOTHROID) 50 MCG tablet, Take 1 tablet (50 mcg total) by mouth daily. .  predniSONE (DELTASONE) 50 MG tablet, Take one tablet daily at breakfast    Current Outpatient Medications (Analgesics):  .  HYDROcodone-acetaminophen (NORCO/VICODIN) 5-325 MG tablet, Take 1-2 tablets by mouth every 6 (six) hours as needed.   Current Outpatient Medications (Other):  Marland Kitchen  Ascorbic Acid (VITAMIN C) 500 MG CAPS, Take 500 mg by mouth daily. .  Diclofenac Sodium (PENNSAID) 2 % SOLN, Place 2 g onto the skin 2 (two) times daily. .  diclofenac sodium (VOLTAREN) 1 % GEL, Apply 2 g topically 4 (four) times daily. Marland Kitchen  gabapentin (NEURONTIN) 100 MG capsule, Take 2 capsules (200 mg total) by mouth at bedtime. .  lidocaine (LIDODERM) 5 %, Place 1 patch onto the  skin daily. Remove & Discard patch within 12 hours or as directed by MD .  methocarbamol (ROBAXIN) 500 MG tablet, Take 1 tablet (500 mg total) by mouth 2 (two) times daily. .  Misc Natural Products (TART CHERRY ADVANCED PO), Take 3 tablets by mouth daily. .  Multiple Vitamins-Minerals (MULTIVITAMIN ADULT PO), Take 1 tablet by mouth daily. .  ondansetron (ZOFRAN ODT) 4 MG disintegrating tablet, Take 1 tablet (4 mg total) by mouth every 8 (eight) hours as needed for nausea or vomiting. .  Turmeric 500 MG CAPS, Take 500 mg by mouth daily. Marland Kitchen  venlafaxine XR (EFFEXOR XR) 150 MG 24 hr capsule, Take 1 capsule (150 mg total) by mouth daily with breakfast. .  Vitamin D, Ergocalciferol, (DRISDOL) 50000 units CAPS capsule, TAKE 1 CAPSULE (50,000 UNITS TOTAL) BY MOUTH EVERY 7 (SEVEN) DAYS.    Past medical history, social, surgical and family history all reviewed in electronic medical record.  No pertanent information unless stated regarding to the chief complaint.   Review of Systems:  No headache, visual changes, nausea, vomiting, diarrhea, constipation, dizziness, abdominal pain, skin rash, fevers, chills, night sweats, weight loss, swollen lymph nodes, body aches, joint swelling,  chest pain, shortness of breath, mood changes.  Positive muscle aches  Objective  Blood pressure 130/84, pulse 75, height 5\' 5"  (1.651 m), weight 243 lb (110.2 kg), SpO2 98 %, currently breastfeeding.    General: No apparent distress alert and oriented x3 mood and affect normal, dressed appropriately.  HEENT: Pupils equal, extraocular movements intact  Respiratory: Patient's speak in full sentences and does not appear short of breath  Cardiovascular: No lower extremity edema, non tender, no erythema  Skin: Warm dry intact with no signs of infection or rash on extremities or on axial skeleton.  Abdomen: Soft nontender  Neuro: Cranial nerves II through XII are intact, neurovascularly intact in all extremities with 2+ DTRs  and 2+ pulses.  Lymph: No lymphadenopathy of posterior or anterior cervical chain or axillae bilaterally.  Gait normal with good balance and coordination.  MSK:  Non tender with full range of motion and good stability and symmetric strength and tone of shoulders, elbows, wrist, hip, knee and ankles bilaterally.  Back Exam:  Inspection: Loss of lordosis Motion: Flexion 45 deg, Extension 25 deg, Side Bending to 35 deg bilaterally,  Rotation to 45 deg bilaterally  SLR laying: Negative  XSLR laying: Negative  Palpable tenderness: Tender to palpation the paraspinal. FABER: Tightness bilaterally. Sensory change: Gross sensation intact to all lumbar and sacral dermatomes.  Reflexes: 2+ at both patellar tendons, 2+ at achilles tendons, Babinski's  downgoing.  Strength at foot  Plantar-flexion: 5/5 Dorsi-flexion: 5/5 Eversion: 5/5 Inversion: 5/5  Leg strength  Quad: 5/5 Hamstring: 5/5 Hip flexor: 5/5 Hip abductors: 4/5 but symmetric Gait unremarkable.  Right knee exam has some mild lateral tracking of the patella.  Patient does have a positive patellar grind noted.  Patient does have full range of motion.  Minimal tenderness to palpation over the superior lateral aspect of the patella  Osteopathic findings  T3 extended rotated and side bent right  T10 extended rotated and side bent left L3 flexed rotated and side bent left Sacrum right on right    Impression and Recommendations:      The above documentation has been reviewed and is accurate and complete [name], DO       Note: This dictation was prepared with Dragon dictation along with smaller phrase technology. Any transcriptional errors that result from this process are unintentional.

## 2018-07-27 ENCOUNTER — Encounter: Payer: Self-pay | Admitting: Family Medicine

## 2018-07-27 ENCOUNTER — Ambulatory Visit: Payer: 59 | Admitting: Family Medicine

## 2018-07-27 VITALS — BP 130/84 | HR 75 | Ht 65.0 in | Wt 243.0 lb

## 2018-07-27 DIAGNOSIS — M4306 Spondylolysis, lumbar region: Secondary | ICD-10-CM | POA: Diagnosis not present

## 2018-07-27 DIAGNOSIS — M999 Biomechanical lesion, unspecified: Secondary | ICD-10-CM | POA: Diagnosis not present

## 2018-07-27 DIAGNOSIS — M9983 Other biomechanical lesions of lumbar region: Secondary | ICD-10-CM | POA: Diagnosis not present

## 2018-07-27 DIAGNOSIS — M25561 Pain in right knee: Secondary | ICD-10-CM | POA: Diagnosis not present

## 2018-07-27 NOTE — Patient Instructions (Signed)
Good to see you  Ice is your friend Stay active Glad you are feeling better  Try the brace with working out or a lot of activity  See me again in 6-8 weeks

## 2018-07-27 NOTE — Assessment & Plan Note (Signed)
Decision today to treat with OMT was based on Physical Exam  After verbal consent patient was treated with HVLA, ME, FPR techniques in , thoracic, lumbar and sacral areas  Patient tolerated the procedure well with improvement in symptoms  Patient given exercises, stretches and lifestyle modifications  See medications in patient instructions if given  Patient will follow up in 4-8 weeks 

## 2018-07-27 NOTE — Assessment & Plan Note (Signed)
Doing well overall.  Responded fairly well to the most recent nerve injection.  Continued with the osteopathic manipulation.  Discussed icing regimen and icing regimen.  Patient will follow-up again in 4 to 8 weeks

## 2018-09-09 ENCOUNTER — Encounter: Payer: Self-pay | Admitting: Family Medicine

## 2018-09-09 ENCOUNTER — Other Ambulatory Visit: Payer: Self-pay | Admitting: Family Medicine

## 2018-09-09 ENCOUNTER — Ambulatory Visit (INDEPENDENT_AMBULATORY_CARE_PROVIDER_SITE_OTHER): Payer: 59 | Admitting: Family Medicine

## 2018-09-09 VITALS — BP 130/84 | HR 87 | Ht 65.0 in | Wt 230.0 lb

## 2018-09-09 DIAGNOSIS — M999 Biomechanical lesion, unspecified: Secondary | ICD-10-CM

## 2018-09-09 DIAGNOSIS — M4306 Spondylolysis, lumbar region: Secondary | ICD-10-CM

## 2018-09-09 NOTE — Progress Notes (Signed)
[name] [address] [place], [place] [address] Phone: [phone] Subjective:     CC: Back pain follow-up  BJY:[id number]  Kristin [name] is a 32 y.o. female coming in with complaint of back and knees pain. Knees feeling ok. Back is still painful. Patient is following up with another provider and is undergoing another potential injection and they are considering the possibility of radiofrequency ablation.  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  Patient is to continue to work on core strengthening which she has been doing intermittently.  Did go to physical therapy for almost a total of 12 weeks in total.    Past Medical History:  Diagnosis Date  . History of kidney stones   . Kidney stone   . Kidney stones    Kidney Stones  . Knee joint pain   . Pregnancy induced hypertension   . Syncope    Past Surgical History:  Procedure Laterality Date  . EXTRACORPOREAL SHOCK WAVE LITHOTRIPSY Left 01/30/2017   Procedure: LEFT EXTRACORPOREAL SHOCK WAVE LITHOTRIPSY (ESWL);  Surgeon: Malen Gauze, MD;  Location: [facility];  Service: Urology;  Laterality: Left;  . KIDNEY STONE SURGERY     Social History   Socioeconomic History  . Marital status: Married    Spouse name: Not on file  . Number of children: Not on file  . Years of education: Not on file  . Highest education level: Not on file  Occupational History  . Not on file  Social Needs  . Financial resource strain: Not on file  . Food insecurity:    Worry: Not on file    Inability: Not on file  . Transportation needs:    Medical: Not on file    Non-medical: Not on file  Tobacco Use  . Smoking status: Never Smoker  . Smokeless tobacco: Never Used  Substance and Sexual Activity  . Alcohol use: No  . Drug use: No  . Sexual activity: Not on file  Lifestyle  . Physical activity:    Days per week: Not on file   Minutes per session: Not on file  . Stress: Not on file  Relationships  . Social connections:    Talks on phone: Not on file    Gets together: Not on file    Attends religious service: Not on file    Active member of club or organization: Not on file    Attends meetings of clubs or organizations: Not on file    Relationship status: Not on file  Other Topics Concern  . Not on file  Social History Narrative  . Not on file   Allergies  Allergen Reactions  . Amoxicillin Hives    Has patient had a PCN reaction causing immediate rash, facial/tongue/throat swelling, SOB or lightheadedness with hypotension: no Has patient had a PCN reaction causing severe rash involving mucus membranes or skin necrosis: No Has patient had a PCN reaction that required hospitalization No Has patient had a PCN reaction occurring within the last 10 years: No If all of the above answers are "NO", then may proceed with Cephalosporin use.   Family History  Problem Relation Age of Onset  . CVA Mother   . Hypertension Mother   . Diabetes Mellitus II Mother   . Congestive Heart Failure Mother   . CVA Father   . Hypertension Father   . Diabetes Mellitus  II Father     Current Outpatient Medications (Endocrine & Metabolic):  .  levothyroxine (SYNTHROID, LEVOTHROID) 50 MCG tablet, TAKE 1 TABLET BY MOUTH EVERY DAY .  predniSONE (DELTASONE) 50 MG tablet, Take one tablet daily at breakfast    Current Outpatient Medications (Analgesics):  .  HYDROcodone-acetaminophen (NORCO/VICODIN) 5-325 MG tablet, Take 1-2 tablets by mouth every 6 (six) hours as needed.   Current Outpatient Medications (Other):  Marland Kitchen.  Ascorbic Acid (VITAMIN C) 500 MG CAPS, Take 500 mg by mouth daily. .  Diclofenac Sodium (PENNSAID) 2 % SOLN, Place 2 g onto the skin 2 (two) times daily. .  diclofenac sodium (VOLTAREN) 1 % GEL, Apply 2 g topically 4 (four) times daily. Marland Kitchen.  gabapentin (NEURONTIN) 100 MG capsule, Take 2 capsules (200 mg total) by  mouth at bedtime. .  lidocaine (LIDODERM) 5 %, Place 1 patch onto the skin daily. Remove & Discard patch within 12 hours or as directed by MD .  methocarbamol (ROBAXIN) 500 MG tablet, Take 1 tablet (500 mg total) by mouth 2 (two) times daily. .  Misc Natural Products (TART CHERRY ADVANCED PO), Take 3 tablets by mouth daily. .  Multiple Vitamins-Minerals (MULTIVITAMIN ADULT PO), Take 1 tablet by mouth daily. .  ondansetron (ZOFRAN ODT) 4 MG disintegrating tablet, Take 1 tablet (4 mg total) by mouth every 8 (eight) hours as needed for nausea or vomiting. .  Turmeric 500 MG CAPS, Take 500 mg by mouth daily. Marland Kitchen.  venlafaxine XR (EFFEXOR XR) 150 MG 24 hr capsule, Take 1 capsule (150 mg total) by mouth daily with breakfast. .  Vitamin D, Ergocalciferol, (DRISDOL) 50000 units CAPS capsule, TAKE 1 CAPSULE (50,000 UNITS TOTAL) BY MOUTH EVERY 7 (SEVEN) DAYS.    Past medical history, social, surgical and family history all reviewed in electronic medical record.  No pertanent information unless stated regarding to the chief complaint.   Review of Systems:  No headache, visual changes, nausea, vomiting, diarrhea, constipation, dizziness, abdominal pain, skin rash, fevers, chills, night sweats, weight loss, swollen lymph nodes,, chest pain, shortness of breath, mood changes.  Positive muscle aches, body aches p  Objective  Blood pressure 130/84, pulse 87, height 5\' 5"  (1.651 m), weight 230 lb (104.3 kg), SpO2 97 %, currently breastfeeding.    General: No apparent distress alert and oriented x3 mood and affect normal, dressed appropriately.  HEENT: Pupils equal, extraocular movements intact  Respiratory: Patient's speak in full sentences and does not appear short of breath  Cardiovascular: No lower extremity edema, non tender, no erythema  Skin: Warm dry intact with no signs of infection or rash on extremities or on axial skeleton.  Abdomen: Soft nontender  Neuro: Cranial nerves II through XII are intact,  neurovascularly intact in all extremities with 2+ DTRs and 2+ pulses.  Lymph: No lymphadenopathy of posterior or anterior cervical chain or axillae bilaterally.  Gait normal with good balance and coordination.  MSK:  Non tender with full range of motion and good stability and symmetric strength and tone of shoulders, elbows, wrist, hip, knee and ankles bilaterally.  Back Exam:  Inspection: Loss of lordosis Motion: Flexion 40 deg, Extension 25 deg, Side Bending to 35 deg bilaterally,  Rotation to 40 deg bilaterally  SLR laying: Negative  XSLR laying: Negative  Palpable tenderness: Positive tenderness to palpation the paraspinal musculature lumbar spine right greater than left. FABER: Tightness bilaterally. Sensory change: Gross sensation intact to all lumbar and sacral dermatomes.  Reflexes: 2+ at both patellar tendons,  2+ at achilles tendons, Babinski's downgoing.  Strength at foot  Plantar-flexion: 5/5 Dorsi-flexion: 5/5 Eversion: 5/5 Inversion: 5/5  Leg strength  Quad: 5/5 Hamstring: 5/5 Hip flexor: 5/5 Hip abductors: 5/5  Gait unremarkable.  Osteopathic findings  T6 extended rotated and side bent left L3 flexed rotated and side bent left  Sacrum right on right    Impression and Recommendations:     This case required medical decision making of moderate complexity. The above documentation has been reviewed and is accurate and complete [name], DO       Note: This dictation was prepared with Dragon dictation along with smaller phrase technology. Any transcriptional errors that result from this process are unintentional.

## 2018-09-09 NOTE — Assessment & Plan Note (Signed)
Decision today to treat with OMT was based on Physical Exam  After verbal consent patient was treated with HVLA, ME, FPR techniques in  thoracic, lumbar and sacral areas  Patient tolerated the procedure well with improvement in symptoms  Patient given exercises, stretches and lifestyle modifications  See medications in patient instructions if given  Patient will follow up in 4-6 weeks 

## 2018-09-09 NOTE — Assessment & Plan Note (Signed)
Somewhat stable.  Patient still with chronic pain.  Seen another provider and having injections.  With another one scheduled later this week.  We discussed with patient that icing regimen, home exercise, which activities to do which wants to avoid.  Patient is to increase activity slowly over the course the next several days.  Responds fairly well to manipulation.  Follow-up again in 4 to 8 weeks

## 2018-09-09 NOTE — Patient Instructions (Signed)
Good to see you  Ice is your friend Stay active Hope the injection goes well  See me again in 6 weeks

## 2018-10-20 ENCOUNTER — Ambulatory Visit: Payer: Medicaid Other | Admitting: Family Medicine

## 2018-10-20 NOTE — Progress Notes (Signed)
[facility] [address] [place], [place] [address] Phone: [phone] Subjective:   [name], [name], am serving as a scribe for Dr. Antoine PrimasZachary Vylet Maffia.   CC: Wrist injury  BJY:NWGNFAOZHYHPI:Subjective  Wen Elvera BickerG Dethlefs is a 33 y.o. female coming in with complaint of back  And hand pain. Patient states that she had an injection from Dr. Ollen BowlHarkins. Did not have relief from injection. Pain increases with lumbar flexion. Pain can radiate into glutes.   Patient is also having left hand pain. Does recall putting hand in steering wheel and felt a pop as she turned the wheel. Swelling over dorsal wrist for one week. Decreased ROM. Denies any radiating symptoms.  Patient states if anything seems to be worsening over the course the days.  Not improving.     Past Medical History:  Diagnosis Date  . History of kidney stones   . Kidney stone   . Kidney stones    Kidney Stones  . Knee joint pain   . Pregnancy induced hypertension   . Syncope    Past Surgical History:  Procedure Laterality Date  . EXTRACORPOREAL SHOCK WAVE LITHOTRIPSY Left 01/30/2017   Procedure: LEFT EXTRACORPOREAL SHOCK WAVE LITHOTRIPSY (ESWL);  Surgeon: Malen GauzeMcKenzie, Patrick L, MD;  Location: [facility];  Service: Urology;  Laterality: Left;  . KIDNEY STONE SURGERY     Social History   Socioeconomic History  . Marital status: Married    Spouse name: Not on file  . Number of children: Not on file  . Years of education: Not on file  . Highest education level: Not on file  Occupational History  . Not on file  Social Needs  . Financial resource strain: Not on file  . Food insecurity:    Worry: Not on file    Inability: Not on file  . Transportation needs:    Medical: Not on file    Non-medical: Not on file  Tobacco Use  . Smoking status: Never Smoker  . Smokeless tobacco: Never Used  Substance and Sexual Activity  . Alcohol use: No  . Drug use: No  . Sexual activity: Not on file  Lifestyle  . Physical  activity:    Days per week: Not on file    Minutes per session: Not on file  . Stress: Not on file  Relationships  . Social connections:    Talks on phone: Not on file    Gets together: Not on file    Attends religious service: Not on file    Active member of club or organization: Not on file    Attends meetings of clubs or organizations: Not on file    Relationship status: Not on file  Other Topics Concern  . Not on file  Social History Narrative  . Not on file   Allergies  Allergen Reactions  . Amoxicillin Hives    Has patient had a PCN reaction causing immediate rash, facial/tongue/throat swelling, SOB or lightheadedness with hypotension: no Has patient had a PCN reaction causing severe rash involving mucus membranes or skin necrosis: No Has patient had a PCN reaction that required hospitalization No Has patient had a PCN reaction occurring within the last 10 years: No If all of the above answers are "NO", then may proceed with Cephalosporin use.   Family History  Problem Relation Age of Onset  . CVA Mother   . Hypertension Mother   . Diabetes Mellitus II Mother   . Congestive Heart Failure Mother   .  CVA Father   . Hypertension Father   . Diabetes Mellitus II Father     Current Outpatient Medications (Endocrine & Metabolic):  .  levothyroxine (SYNTHROID, LEVOTHROID) 50 MCG tablet, TAKE 1 TABLET BY MOUTH EVERY DAY .  predniSONE (DELTASONE) 50 MG tablet, Take one tablet daily at breakfast    Current Outpatient Medications (Analgesics):  .  HYDROcodone-acetaminophen (NORCO/VICODIN) 5-325 MG tablet, Take 1-2 tablets by mouth every 6 (six) hours as needed.   Current Outpatient Medications (Other):  Marland Kitchen  Ascorbic Acid (VITAMIN C) 500 MG CAPS, Take 500 mg by mouth daily. .  Diclofenac Sodium (PENNSAID) 2 % SOLN, Place 2 g onto the skin 2 (two) times daily. .  diclofenac sodium (VOLTAREN) 1 % GEL, Apply 2 g topically 4 (four) times daily. Marland Kitchen  gabapentin (NEURONTIN) 100 MG  capsule, Take 2 capsules (200 mg total) by mouth at bedtime. .  lidocaine (LIDODERM) 5 %, Place 1 patch onto the skin daily. Remove & Discard patch within 12 hours or as directed by MD .  methocarbamol (ROBAXIN) 500 MG tablet, Take 1 tablet (500 mg total) by mouth 2 (two) times daily. .  Misc Natural Products (TART CHERRY ADVANCED PO), Take 3 tablets by mouth daily. .  Multiple Vitamins-Minerals (MULTIVITAMIN ADULT PO), Take 1 tablet by mouth daily. .  ondansetron (ZOFRAN ODT) 4 MG disintegrating tablet, Take 1 tablet (4 mg total) by mouth every 8 (eight) hours as needed for nausea or vomiting. .  Turmeric 500 MG CAPS, Take 500 mg by mouth daily. Marland Kitchen  venlafaxine XR (EFFEXOR XR) 150 MG 24 hr capsule, Take 1 capsule (150 mg total) by mouth daily with breakfast. .  Vitamin D, Ergocalciferol, (DRISDOL) 50000 units CAPS capsule, TAKE 1 CAPSULE (50,000 UNITS TOTAL) BY MOUTH EVERY 7 (SEVEN) DAYS. .  Diclofenac Sodium (PENNSAID) 2 % SOLN, Place 2 g onto the skin 2 (two) times daily.    Past medical history, social, surgical and family history all reviewed in electronic medical record.  No pertanent information unless stated regarding to the chief complaint.   Review of Systems:  No headache, visual changes, nausea, vomiting, diarrhea, constipation, dizziness, abdominal pain, skin rash, fevers, chills, night sweats, weight loss, swollen lymph nodes, body aches, joint swelling, chest pain, shortness of breath, mood changes.  Positive muscle aches  Objective  Blood pressure 110/82, pulse 85, height 5\' 5"  (1.651 m), weight 245 lb (111.1 kg), last menstrual period 10/20/2018, SpO2 98 %, currently breastfeeding.    General: No apparent distress alert and oriented x3 mood and affect normal, dressed appropriately.  HEENT: Pupils equal, extraocular movements intact  Respiratory: Patient's speak in full sentences and does not appear short of breath  Cardiovascular: No lower extremity edema, non tender, no  erythema  Skin: Warm dry intact with no signs of infection or rash on extremities or on axial skeleton.  Abdomen: Soft nontender  Neuro: Cranial nerves II through XII are intact, neurovascularly intact in all extremities with 2+ DTRs and 2+ pulses.  Lymph: No lymphadenopathy of posterior or anterior cervical chain or axillae bilaterally.  Gait normal with good balance and coordination.  MSK:  Non tender with full range of motion and good stability and symmetric strength and tone of shoulders, elbows, , hip, knee and ankles bilaterally.  Wrist: Left wrist Inspection normal with no visible erythema or swelling. ROM smooth and normal with good flexion and extension and ulnar/radial deviation that is symmetrical with opposite wrist. Palpation is normal over metacarpals, navicular, lunate,  and TFCC; tendons without tenderness/ swelling No snuffbox tenderness. No tenderness over Canal of Guyon. Strength 5/5 in all directions without pain. Negative Finkelstein, tinel's and phalens. Negative Watson's test.  MSK US performed of: Left wrist This study was ordered, performed, and interpreted by Terrilee Files D.O.  Wrist: Extensor carpi ulnaris does have hypoechoic changes with what appears to be some mild dried blood on the superficial fibers.  Is intact at the bone though.  No avulsion noted. No effusion seen. TFCC intact. Scapholunate ligament intact. Carpal tunnel visualized mild enlargement. Power doppler signal normal.  IMPRESSION: Extensor carpi ulnaris tendon injury Impression and Recommendations:     This case required medical decision making of moderate complexity. The above documentation has been reviewed and is accurate and complete [name], DO       Note: This dictation was prepared with Dragon dictation along with smaller phrase technology. Any transcriptional errors that result from this process are unintentional.

## 2018-10-21 ENCOUNTER — Ambulatory Visit (INDEPENDENT_AMBULATORY_CARE_PROVIDER_SITE_OTHER): Payer: 59 | Admitting: Family Medicine

## 2018-10-21 ENCOUNTER — Ambulatory Visit (INDEPENDENT_AMBULATORY_CARE_PROVIDER_SITE_OTHER)
Admission: RE | Admit: 2018-10-21 | Discharge: 2018-10-21 | Disposition: A | Discharge status: Home / Self Care | Payer: 59 | Source: Ambulatory Visit | Attending: Family Medicine | Admitting: Family Medicine

## 2018-10-21 ENCOUNTER — Encounter: Payer: Self-pay | Admitting: Family Medicine

## 2018-10-21 VITALS — BP 110/82 | HR 85 | Ht 65.0 in | Wt 245.0 lb

## 2018-10-21 DIAGNOSIS — S63092A Other subluxation of left wrist and hand, initial encounter: Secondary | ICD-10-CM | POA: Diagnosis not present

## 2018-10-21 DIAGNOSIS — M25532 Pain in left wrist: Secondary | ICD-10-CM | POA: Diagnosis not present

## 2018-10-21 HISTORY — DX: Other subluxation of left wrist and hand, initial encounter: S63.092A

## 2018-10-21 MED ORDER — DICLOFENAC SODIUM 2 % TD SOLN: 2.0000 g | Freq: Two times a day (BID) | TRANSDERMAL | 3 refills | Status: DC

## 2018-10-21 NOTE — Assessment & Plan Note (Signed)
Subluxation of extensor carpi ulnaris.  Patient did have been put back into the groove.  In addition to this there seems to be some tendinopathy.  Discussed bracing, home exercise, topical anti-inflammatories, icing regimen.  Patient will avoid heavy lifting with this hand.  Follow-up with me again in 4 weeks.  X-rays ordered to rule out any bony abnormality that could be contributing as well.

## 2018-10-21 NOTE — Patient Instructions (Signed)
Good to see you  Ice is your friend Ice 20 minutes 2 times daily. Usually after activity and before bed. Wear brace day and night for 1 week then nightly for 2 weeks  Or during the day can tape the wrist just above the 2 bumps I showed you  pennsaid pinkie amount topically 2 times daily as needed.   Exercises 3 times a week.  Xray downstairs See me again in 3-4 weeks

## 2018-10-22 ENCOUNTER — Ambulatory Visit: Payer: 59 | Admitting: Family Medicine

## 2018-11-17 NOTE — Progress Notes (Signed)
[name] [address] [place], [place] [address] Phone: [phone] Subjective:   [name], am serving as a scribe for Dr. Antoine Primas.  I'm seeing this patient by the request  of:    CC:   [id number]   1/292/2020: Subluxation of extensor carpi ulnaris.  Patient did have been put back into the groove.  In addition to this there seems to be some tendinopathy.  Discussed bracing, home exercise, topical anti-inflammatories, icing regimen.  Patient will avoid heavy lifting with this hand.  Follow-up with me again in 4 weeks.  X-rays ordered to rule out any bony abnormality that could be contributing as well.  Update 11/18/2018: Kristin [name] is a 33 y.o. female coming in with complaint of left wrist pain. Patient that her swelling has increased but her pain has decrease.  Patient has been doing much better overall.  Still having some mild pain with certain motion and some swelling but states 90% better.  Patient does have back pain.  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  Seen multiple providers.  Has responded intermittently to manipulation.  Does feel like some tightness noted.  Denies any radicular symptoms at the moment but sometimes feels like the legs get fatigued.  Symptoms very uncomfortable even at night    Past Medical History:  Diagnosis Date  . History of kidney stones   . Kidney stone   . Kidney stones    Kidney Stones  . Knee joint pain   . Pregnancy induced hypertension   . Syncope    Past Surgical History:  Procedure Laterality Date  . EXTRACORPOREAL SHOCK WAVE LITHOTRIPSY Left 01/30/2017   Procedure: LEFT EXTRACORPOREAL SHOCK WAVE LITHOTRIPSY (ESWL);  Surgeon: Malen Gauze, MD;  Location: [facility];  Service: Urology;  Laterality: Left;  . KIDNEY STONE SURGERY     Social History   Socioeconomic History  . Marital status: Married    Spouse name: Not on file  . Number of children: Not on file  . Years of education: Not on file   . Highest education level: Not on file  Occupational History  . Not on file  Social Needs  . Financial resource strain: Not on file  . Food insecurity:    Worry: Not on file    Inability: Not on file  . Transportation needs:    Medical: Not on file    Non-medical: Not on file  Tobacco Use  . Smoking status: Never Smoker  . Smokeless tobacco: Never Used  Substance and Sexual Activity  . Alcohol use: No  . Drug use: No  . Sexual activity: Not on file  Lifestyle  . Physical activity:    Days per week: Not on file    Minutes per session: Not on file  . Stress: Not on file  Relationships  . Social connections:    Talks on phone: Not on file    Gets together: Not on file    Attends religious service: Not on file    Active member of club or organization: Not on file    Attends meetings of clubs or organizations: Not on file    Relationship status: Not on file  Other Topics Concern  . Not on file  Social History Narrative  . Not on file   Allergies  Allergen Reactions  . Amoxicillin Hives    Has patient had a PCN reaction causing immediate rash, facial/tongue/throat swelling, SOB or lightheadedness with hypotension: no Has  patient had a PCN reaction causing severe rash involving mucus membranes or skin necrosis: No Has patient had a PCN reaction that required hospitalization No Has patient had a PCN reaction occurring within the last 10 years: No If all of the above answers are "NO", then may proceed with Cephalosporin use.   Family History  Problem Relation Age of Onset  . CVA Mother   . Hypertension Mother   . Diabetes Mellitus II Mother   . Congestive Heart Failure Mother   . CVA Father   . Hypertension Father   . Diabetes Mellitus II Father     Current Outpatient Medications (Endocrine & Metabolic):  .  levothyroxine (SYNTHROID, LEVOTHROID) 50 MCG tablet, TAKE 1 TABLET BY MOUTH EVERY DAY .  predniSONE (DELTASONE) 50 MG tablet, Take one tablet daily at  breakfast    Current Outpatient Medications (Analgesics):  .  HYDROcodone-acetaminophen (NORCO/VICODIN) 5-325 MG tablet, Take 1-2 tablets by mouth every 6 (six) hours as needed.   Current Outpatient Medications (Other):  Marland Kitchen  Ascorbic Acid (VITAMIN C) 500 MG CAPS, Take 500 mg by mouth daily. .  Diclofenac Sodium (PENNSAID) 2 % SOLN, Place 2 g onto the skin 2 (two) times daily. .  Diclofenac Sodium (PENNSAID) 2 % SOLN, Place 2 g onto the skin 2 (two) times daily. .  diclofenac sodium (VOLTAREN) 1 % GEL, Apply 2 g topically 4 (four) times daily. Marland Kitchen  gabapentin (NEURONTIN) 100 MG capsule, Take 2 capsules (200 mg total) by mouth at bedtime. .  lidocaine (LIDODERM) 5 %, Place 1 patch onto the skin daily. Remove & Discard patch within 12 hours or as directed by MD .  methocarbamol (ROBAXIN) 500 MG tablet, Take 1 tablet (500 mg total) by mouth 2 (two) times daily. .  Misc Natural Products (TART CHERRY ADVANCED PO), Take 3 tablets by mouth daily. .  Multiple Vitamins-Minerals (MULTIVITAMIN ADULT PO), Take 1 tablet by mouth daily. .  ondansetron (ZOFRAN ODT) 4 MG disintegrating tablet, Take 1 tablet (4 mg total) by mouth every 8 (eight) hours as needed for nausea or vomiting. .  Turmeric 500 MG CAPS, Take 500 mg by mouth daily. Marland Kitchen  venlafaxine XR (EFFEXOR XR) 150 MG 24 hr capsule, Take 1 capsule (150 mg total) by mouth daily with breakfast. .  Vitamin D, Ergocalciferol, (DRISDOL) 50000 units CAPS capsule, TAKE 1 CAPSULE (50,000 UNITS TOTAL) BY MOUTH EVERY 7 (SEVEN) DAYS.    Past medical history, social, surgical and family history all reviewed in electronic medical record.  No pertanent information unless stated regarding to the chief complaint.   Review of Systems:  No headache, visual changes, nausea, vomiting, diarrhea, constipation, dizziness, abdominal pain, skin rash, fevers, chills, night sweats, weight loss, swollen lymph nodes, body aches, joint swelling, muscle aches, chest pain, shortness  of breath, mood changes.   Objective  Blood pressure 122/84, pulse 78, height 5\' 5"  (1.651 m), weight 245 lb (111.1 kg), last menstrual period 10/20/2018, SpO2 97 %, currently breastfeeding.   General: No apparent distress alert and oriented x3 mood and affect normal, dressed appropriately.  HEENT: Pupils equal, extraocular movements intact  Respiratory: Patient's speak in full sentences and does not appear short of breath  Cardiovascular: No lower extremity edema, non tender, no erythema  Skin: Warm dry intact with no signs of infection or rash on extremities or on axial skeleton.  Abdomen: Soft nontender  Neuro: Cranial nerves II through XII are intact, neurovascularly intact in all extremities with 2+ DTRs and 2+  pulses.  Lymph: No lymphadenopathy of posterior or anterior cervical chain or axillae bilaterally.  Gait normal with good balance and coordination.  MSK:  Non tender with full range of motion and good stability and symmetric strength and tone of shoulders, elbows,  hip, knee and ankles bilaterally.  Left wrist exam shows the patient swelling around the extensor carpi ulnaris tendon is improved at the moment.  Patient is very minimal swelling over the lateral aspect of the wrist but full range of motion otherwise.  Good grip strength.  Back exam has some loss of lordosis.  Patient does have poor core strength.  Weakness of hip abductors.  Tightness of the [facility] bilaterally.  Negative straight leg test but worsening pain with extension.  Osteopathic findings  T9 extended rotated and side bent left L3 flexed rotated and side bent right Sacrum right on right    Impression and Recommendations:     This case required medical decision making of moderate complexity. The above documentation has been reviewed and is accurate and complete [name], DO       Note: This dictation was prepared with Dragon dictation along with smaller phrase technology. Any transcriptional  errors that result from this process are unintentional.

## 2018-11-18 ENCOUNTER — Ambulatory Visit: Payer: Self-pay

## 2018-11-18 ENCOUNTER — Encounter: Payer: Self-pay | Admitting: Family Medicine

## 2018-11-18 ENCOUNTER — Ambulatory Visit (INDEPENDENT_AMBULATORY_CARE_PROVIDER_SITE_OTHER): Payer: 59 | Admitting: Family Medicine

## 2018-11-18 VITALS — BP 122/84 | HR 78 | Ht 65.0 in | Wt 245.0 lb

## 2018-11-18 DIAGNOSIS — M4306 Spondylolysis, lumbar region: Secondary | ICD-10-CM

## 2018-11-18 DIAGNOSIS — M999 Biomechanical lesion, unspecified: Secondary | ICD-10-CM

## 2018-11-18 DIAGNOSIS — S63092A Other subluxation of left wrist and hand, initial encounter: Secondary | ICD-10-CM

## 2018-11-18 DIAGNOSIS — M25532 Pain in left wrist: Secondary | ICD-10-CM

## 2018-11-18 NOTE — Assessment & Plan Note (Signed)
Significant improvement noted today.  Patient is to increase activity as tolerated.  Can follow-up as needed for this problem

## 2018-11-18 NOTE — Assessment & Plan Note (Signed)
Decision today to treat with OMT was based on Physical Exam  After verbal consent patient was treated with HVLA, ME, FPR techniques in  thoracic, lumbar and sacral areas  Patient tolerated the procedure well with improvement in symptoms  Patient given exercises, stretches and lifestyle modifications  See medications in patient instructions if given  Patient will follow up in 4-6 weeks 

## 2018-11-18 NOTE — Assessment & Plan Note (Signed)
Pars defect noted.  Patient is seen multiple providers for this at this time.  Has failed all conservative therapy including formal physical therapy and is getting very mild minimal response to osteopathic manipulation.  Could be needing surgical intervention at some point in the near future.  Discussed posture ergonomics and core strengthening.  Follow-up again in 4 to 6 weeks

## 2018-11-18 NOTE — Patient Instructions (Addendum)
Good to see you  Ice is your friend Back is doing well overall  Stay active.  Wrist looks great  See me again in 5-6 weeks

## 2018-12-23 ENCOUNTER — Ambulatory Visit: Payer: 59 | Admitting: Family Medicine

## 2019-01-25 ENCOUNTER — Telehealth: Payer: Self-pay

## 2019-01-25 NOTE — Telephone Encounter (Signed)
Called patient to reschedule appointment on 5/11 due to provider being out of office. Phone kept ringing so I could not leave message.

## 2019-02-01 ENCOUNTER — Ambulatory Visit: Payer: 59 | Admitting: Family Medicine

## 2019-02-03 ENCOUNTER — Encounter: Payer: Self-pay | Admitting: Family Medicine

## 2019-02-03 ENCOUNTER — Other Ambulatory Visit: Payer: Self-pay

## 2019-02-03 ENCOUNTER — Ambulatory Visit (INDEPENDENT_AMBULATORY_CARE_PROVIDER_SITE_OTHER): Payer: 59 | Admitting: Family Medicine

## 2019-02-03 VITALS — BP 124/78 | Ht 65.0 in | Wt 247.0 lb

## 2019-02-03 DIAGNOSIS — M4306 Spondylolysis, lumbar region: Secondary | ICD-10-CM | POA: Diagnosis not present

## 2019-02-03 DIAGNOSIS — M999 Biomechanical lesion, unspecified: Secondary | ICD-10-CM

## 2019-02-03 MED ORDER — TRAMADOL HCL 50 MG PO TABS: 50.0000 mg | ORAL_TABLET | Freq: Three times a day (TID) | ORAL | 0 refills | Status: AC | PRN

## 2019-02-03 MED ORDER — TIZANIDINE HCL 4 MG PO TABS: 4.0000 mg | ORAL_TABLET | Freq: Four times a day (QID) | ORAL | 0 refills | Status: DC | PRN

## 2019-02-03 NOTE — Assessment & Plan Note (Signed)
   Decision today to treat with OMT was based on Physical Exam  After verbal consent patient was treated with HVLA, ME, FPR techniques in  thoracic, lumbar and sacral areas  Patient tolerated the procedure well with improvement in symptoms  Patient given exercises, stretches and lifestyle modifications  See medications in patient instructions if given  Patient will follow up in6 weeks 

## 2019-02-03 NOTE — Assessment & Plan Note (Signed)
Discussed weight loss and core strengthening.  Patient is seen neurosurgery on a regular basis.  We will see how patient does with this conservative therapy but may need surgical intervention if the pain is not completely resolved.  Patient states that this is the best she has been in quite some time.  Pars defect.  Discussed posture and ergonomics.  Discussed which activities which wants to avoid follow-up again in 6 weeks

## 2019-02-03 NOTE — Patient Instructions (Signed)
Muscle relaxer and tramadol given but avoid using them when you can.  Ice is your friend Stay active I hope the pain stays well controlled See me again in 6 weeks

## 2019-02-03 NOTE — Progress Notes (Signed)
[name] [address] [place], [place] [address] Phone: [phone] Subjective:   [name], am serving as a scribe for Dr. Antoine Primas.   CC: back pain   [id number]  Kristin [name] is a 33 y.o. female coming in with complaint of back pain. Is here for OMT today. Has been doing ok since last visit. Did get an epidural in her back last week. Did have very low BP prior to the injection and had to be monitored for a few hours before they would give her the injection.  Patient has known spondylolisthesis with a L5 pars defect.  Patient was given an injection in the defect.  Has noticed that the pain has improved.      Past Medical History:  Diagnosis Date  . History of kidney stones   . Kidney stone   . Kidney stones    Kidney Stones  . Knee joint pain   . Pregnancy induced hypertension   . Syncope    Past Surgical History:  Procedure Laterality Date  . EXTRACORPOREAL SHOCK WAVE LITHOTRIPSY Left 01/30/2017   Procedure: LEFT EXTRACORPOREAL SHOCK WAVE LITHOTRIPSY (ESWL);  Surgeon: Malen Gauze, MD;  Location: [facility];  Service: Urology;  Laterality: Left;  . KIDNEY STONE SURGERY     Social History   Socioeconomic History  . Marital status: Married    Spouse name: Not on file  . Number of children: Not on file  . Years of education: Not on file  . Highest education level: Not on file  Occupational History  . Not on file  Social Needs  . Financial resource strain: Not on file  . Food insecurity:    Worry: Not on file    Inability: Not on file  . Transportation needs:    Medical: Not on file    Non-medical: Not on file  Tobacco Use  . Smoking status: Never Smoker  . Smokeless tobacco: Never Used  Substance and Sexual Activity  . Alcohol use: No  . Drug use: No  . Sexual activity: Not on file  Lifestyle  . Physical activity:    Days per week: Not on file    Minutes per session: Not on file  . Stress: Not on file   Relationships  . Social connections:    Talks on phone: Not on file    Gets together: Not on file    Attends religious service: Not on file    Active member of club or organization: Not on file    Attends meetings of clubs or organizations: Not on file    Relationship status: Not on file  Other Topics Concern  . Not on file  Social History Narrative  . Not on file   Allergies  Allergen Reactions  . Amoxicillin Hives    Has patient had a PCN reaction causing immediate rash, facial/tongue/throat swelling, SOB or lightheadedness with hypotension: no Has patient had a PCN reaction causing severe rash involving mucus membranes or skin necrosis: No Has patient had a PCN reaction that required hospitalization No Has patient had a PCN reaction occurring within the last 10 years: No If all of the above answers are "NO", then may proceed with Cephalosporin use.   Family History  Problem Relation Age of Onset  . CVA Mother   . Hypertension Mother   . Diabetes Mellitus II Mother   . Congestive Heart Failure Mother   . CVA Father   . Hypertension  Father   . Diabetes Mellitus II Father     Current Outpatient Medications (Endocrine & Metabolic):  .  levothyroxine (SYNTHROID, LEVOTHROID) 50 MCG tablet, TAKE 1 TABLET BY MOUTH EVERY DAY .  predniSONE (DELTASONE) 50 MG tablet, Take one tablet daily at breakfast    Current Outpatient Medications (Analgesics):  .  HYDROcodone-acetaminophen (NORCO/VICODIN) 5-325 MG tablet, Take 1-2 tablets by mouth every 6 (six) hours as needed.   Current Outpatient Medications (Other):  Marland Kitchen  Ascorbic Acid (VITAMIN C) 500 MG CAPS, Take 500 mg by mouth daily. .  Diclofenac Sodium (PENNSAID) 2 % SOLN, Place 2 g onto the skin 2 (two) times daily. .  Diclofenac Sodium (PENNSAID) 2 % SOLN, Place 2 g onto the skin 2 (two) times daily. .  diclofenac sodium (VOLTAREN) 1 % GEL, Apply 2 g topically 4 (four) times daily. Marland Kitchen  gabapentin (NEURONTIN) 100 MG capsule, Take 2  capsules (200 mg total) by mouth at bedtime. .  lidocaine (LIDODERM) 5 %, Place 1 patch onto the skin daily. Remove & Discard patch within 12 hours or as directed by MD .  methocarbamol (ROBAXIN) 500 MG tablet, Take 1 tablet (500 mg total) by mouth 2 (two) times daily. .  Misc Natural Products (TART CHERRY ADVANCED PO), Take 3 tablets by mouth daily. .  Multiple Vitamins-Minerals (MULTIVITAMIN ADULT PO), Take 1 tablet by mouth daily. .  ondansetron (ZOFRAN ODT) 4 MG disintegrating tablet, Take 1 tablet (4 mg total) by mouth every 8 (eight) hours as needed for nausea or vomiting. .  Turmeric 500 MG CAPS, Take 500 mg by mouth daily. Marland Kitchen  venlafaxine XR (EFFEXOR XR) 150 MG 24 hr capsule, Take 1 capsule (150 mg total) by mouth daily with breakfast. .  Vitamin D, Ergocalciferol, (DRISDOL) 50000 units CAPS capsule, TAKE 1 CAPSULE (50,000 UNITS TOTAL) BY MOUTH EVERY 7 (SEVEN) DAYS.    Past medical history, social, surgical and family history all reviewed in electronic medical record.  No pertanent information unless stated regarding to the chief complaint.   Review of Systems:  No headache, visual changes, nausea, vomiting, diarrhea, constipation, dizziness, abdominal pain, skin rash, fevers, chills, night sweats, weight loss, swollen lymph nodes, body aches, joint swelling, muscle aches, chest pain, shortness of breath, mood changes.   Objective  currently breastfeeding. Systems examined below as of    General: No apparent distress alert and oriented x3 mood and affect normal, dressed appropriately.  HEENT: Pupils equal, extraocular movements intact  Respiratory: Patient's speak in full sentences and does not appear short of breath  Cardiovascular: No lower extremity edema, non tender, no erythema  Skin: Warm dry intact with no signs of infection or rash on extremities or on axial skeleton.  Abdomen: Soft nontender  Neuro: Cranial nerves II through XII are intact, neurovascularly intact in all  extremities with 2+ DTRs and 2+ pulses.  Lymph: No lymphadenopathy of posterior or anterior cervical chain or axillae bilaterally.  Gait normal with good balance and coordination.  MSK:  Non tender with full range of motion and good stability and symmetric strength and tone of shoulders, elbows, wrist, hip, knee and ankles bilaterally.  Back Exam:  Inspection: Unremarkable  Motion: Flexion 35 deg, Extension 25 deg, Side Bending to345 deg bilaterally,  Rotation to 35 deg bilaterally  SLR laying: Negative  XSLR laying: Negative  Palpable tenderness: Tender to palpation the paraspinal musculature. FABER: Tender to palpation with tightness bilaterally. Sensory change: Gross sensation intact to all lumbar and sacral dermatomes.  Reflexes: 2+ at both patellar tendons, 2+ at achilles tendons, Babinski's downgoing.  Strength at foot  Plantar-flexion: 5/5 Dorsi-flexion: 5/5 Eversion: 5/5 Inversion: 5/5  Leg strength  Quad: 5/5 Hamstring: 5/5 Hip flexor: 5/5 Hip abductors: 5/5  Gait unremarkable.  Osteopathic findings  T9 extended rotated and side bent left L2 flexed rotated and side bent right Sacrum right on right    Impression and Recommendations:     This case required medical decision making of moderate complexity. The above documentation has been reviewed and is accurate and complete Kristin SaaZachary M Smith, DO       Note: This dictation was prepared with Dragon dictation along with smaller phrase technology. Any transcriptional errors that result from this process are unintentional.

## 2019-03-09 ENCOUNTER — Ambulatory Visit: Payer: Self-pay

## 2019-03-09 ENCOUNTER — Other Ambulatory Visit: Payer: Self-pay

## 2019-03-09 ENCOUNTER — Ambulatory Visit (INDEPENDENT_AMBULATORY_CARE_PROVIDER_SITE_OTHER): Payer: 59 | Admitting: Family Medicine

## 2019-03-09 ENCOUNTER — Encounter: Payer: Self-pay | Admitting: Family Medicine

## 2019-03-09 VITALS — BP 130/82 | HR 57 | Ht 65.0 in | Wt 249.0 lb

## 2019-03-09 DIAGNOSIS — M4306 Spondylolysis, lumbar region: Secondary | ICD-10-CM

## 2019-03-09 DIAGNOSIS — M999 Biomechanical lesion, unspecified: Secondary | ICD-10-CM | POA: Diagnosis not present

## 2019-03-09 DIAGNOSIS — M79641 Pain in right hand: Secondary | ICD-10-CM

## 2019-03-09 DIAGNOSIS — L720 Epidermal cyst: Secondary | ICD-10-CM

## 2019-03-09 DIAGNOSIS — M533 Sacrococcygeal disorders, not elsewhere classified: Secondary | ICD-10-CM

## 2019-03-09 HISTORY — DX: Epidermal cyst: L72.0

## 2019-03-09 NOTE — Assessment & Plan Note (Signed)
On ultrasound appears to be more of a cystic fluid.  Seems to be in the soft tissue.  Likely posttraumatic.  Possible ganglion.  Patient wants to hold on any type of aspiration.  Patient will do padding.  Follow-up again in 4 to 6 weeks

## 2019-03-09 NOTE — Patient Instructions (Signed)
Good to see you  Weight lifting gloves or padding could help the hand  Back is good See me again in 4-5 weeks

## 2019-03-09 NOTE — Progress Notes (Signed)
Kristin ScaleZach Nycole [name] D.O. [facility] [address] [place], [place] [address] Phone: [phone] Subjective:   Kristin [name], [name], am serving as a scribe for Dr. [name].  CC: right finger pain, back pain   BJY:NWGNFAOZHYHPI:Subjective   02/03/2019: Discussed weight loss and core strengthening.  Patient is seen neurosurgery on a regular basis.  We will see how patient does with this conservative therapy but may need surgical intervention if the pain is not completely resolved.  Patient states that this is the best she has been in quite some time.  Pars defect.  Discussed posture and ergonomics.  Discussed which activities which wants to avoid follow-up again in 6 weeks  Update 03/09/2019: Barnet GlasgowLauren G [name] is a 33 y.o. female coming in with complaint of right hand and finger pain. States that she has had a knot on her hand for 3 weeks near the 5th metacarpal. Has been using IBU and ice. Pain with pressure over that area. Does feel tingling in the 5th finger.   Back pain doing reasonably well.  Did do well with the other injections from an outside facility.  Patient feels like she has made progress.  Trying to workout on a more regular basis but finding it difficult.  States that it is not from pain but more secondary to time.        Past Medical History:  Diagnosis Date  . History of kidney stones   . Kidney stone   . Kidney stones    Kidney Stones  . Knee joint pain   . Pregnancy induced hypertension   . Syncope    Past Surgical History:  Procedure Laterality Date  . EXTRACORPOREAL SHOCK WAVE LITHOTRIPSY Left 01/30/2017   Procedure: LEFT EXTRACORPOREAL SHOCK WAVE LITHOTRIPSY (ESWL);  Surgeon: Malen GauzeMcKenzie, Patrick L, MD;  Location: [facility];  Service: Urology;  Laterality: Left;  . KIDNEY STONE SURGERY     Social History   Socioeconomic History  . Marital status: Married    Spouse name: Not on file  . Number of children: Not on file  . Years of education: Not on file  . Highest education  level: Not on file  Occupational History  . Not on file  Social Needs  . Financial resource strain: Not on file  . Food insecurity    Worry: Not on file    Inability: Not on file  . Transportation needs    Medical: Not on file    Non-medical: Not on file  Tobacco Use  . Smoking status: Never Smoker  . Smokeless tobacco: Never Used  Substance and Sexual Activity  . Alcohol use: No  . Drug use: No  . Sexual activity: Not on file  Lifestyle  . Physical activity    Days per week: Not on file    Minutes per session: Not on file  . Stress: Not on file  Relationships  . Social Musicianconnections    Talks on phone: Not on file    Gets together: Not on file    Attends religious service: Not on file    Active member of club or organization: Not on file    Attends meetings of clubs or organizations: Not on file    Relationship status: Not on file  Other Topics Concern  . Not on file  Social History Narrative  . Not on file   Allergies  Allergen Reactions  . Amoxicillin Hives    Has patient had a PCN reaction causing immediate rash, facial/tongue/throat  swelling, SOB or lightheadedness with hypotension: no Has patient had a PCN reaction causing severe rash involving mucus membranes or skin necrosis: No Has patient had a PCN reaction that required hospitalization No Has patient had a PCN reaction occurring within the last 10 years: No If all of the above answers are "NO", then may proceed with Cephalosporin use.   Family History  Problem Relation Age of Onset  . CVA Mother   . Hypertension Mother   . Diabetes Mellitus II Mother   . Congestive Heart Failure Mother   . CVA Father   . Hypertension Father   . Diabetes Mellitus II Father     Current Outpatient Medications (Endocrine & Metabolic):  .  levothyroxine (SYNTHROID, LEVOTHROID) 50 MCG tablet, TAKE 1 TABLET BY MOUTH EVERY DAY .  predniSONE (DELTASONE) 50 MG tablet, Take one tablet daily at breakfast    Current Outpatient  Medications (Analgesics):  .  HYDROcodone-acetaminophen (NORCO/VICODIN) 5-325 MG tablet, Take 1-2 tablets by mouth every 6 (six) hours as needed.   Current Outpatient Medications (Other):  Marland Kitchen.  Ascorbic Acid (VITAMIN C) 500 MG CAPS, Take 500 mg by mouth daily. .  Diclofenac Sodium (PENNSAID) 2 % SOLN, Place 2 g onto the skin 2 (two) times daily. .  Diclofenac Sodium (PENNSAID) 2 % SOLN, Place 2 g onto the skin 2 (two) times daily. .  diclofenac sodium (VOLTAREN) 1 % GEL, Apply 2 g topically 4 (four) times daily. Marland Kitchen.  gabapentin (NEURONTIN) 100 MG capsule, Take 2 capsules (200 mg total) by mouth at bedtime. .  lidocaine (LIDODERM) 5 %, Place 1 patch onto the skin daily. Remove & Discard patch within 12 hours or as directed by MD .  methocarbamol (ROBAXIN) 500 MG tablet, Take 1 tablet (500 mg total) by mouth 2 (two) times daily. .  Misc Natural Products (TART CHERRY ADVANCED PO), Take 3 tablets by mouth daily. .  Multiple Vitamins-Minerals (MULTIVITAMIN ADULT PO), Take 1 tablet by mouth daily. .  ondansetron (ZOFRAN ODT) 4 MG disintegrating tablet, Take 1 tablet (4 mg total) by mouth every 8 (eight) hours as needed for nausea or vomiting. Marland Kitchen.  tiZANidine (ZANAFLEX) 4 MG tablet, Take 1 tablet (4 mg total) by mouth every 6 (six) hours as needed for muscle spasms. .  Turmeric 500 MG CAPS, Take 500 mg by mouth daily. Marland Kitchen.  venlafaxine XR (EFFEXOR XR) 150 MG 24 hr capsule, Take 1 capsule (150 mg total) by mouth daily with breakfast. .  Vitamin D, Ergocalciferol, (DRISDOL) 50000 units CAPS capsule, TAKE 1 CAPSULE (50,000 UNITS TOTAL) BY MOUTH EVERY 7 (SEVEN) DAYS.    Past medical history, social, surgical and family history all reviewed in electronic medical record.  No pertanent information unless stated regarding to the chief complaint.   Review of Systems:  No headache, visual changes, nausea, vomiting, diarrhea, constipation, dizziness, abdominal pain, skin rash, fevers, chills, night sweats, weight  loss, swollen lymph nodes, body aches, joint swelling, muscle aches, chest pain, shortness of breath, mood changes.   Objective  currently breastfeeding. Systems examined below as of    General: No apparent distress alert and oriented x3 mood and affect normal, dressed appropriately.  HEENT: Pupils equal, extraocular movements intact  Respiratory: Patient's speak in full sentences and does not appear short of breath  Cardiovascular: No lower extremity edema, non tender, no erythema  Skin: Warm dry intact with no signs of infection or rash on extremities or on axial skeleton.  Abdomen: Soft nontender  Neuro: Cranial  nerves II through XII are intact, neurovascularly intact in all extremities with 2+ DTRs and 2+ pulses.  Lymph: No lymphadenopathy of posterior or anterior cervical chain or axillae bilaterally.  Gait normal with good balance and coordination.  MSK:  Non tender with full range of motion and good stability and symmetric strength and tone of shoulders, elbows, wrist, hip, knee and ankles bilaterally.    Back Exam:  Inspection: Unremarkable  Motion: Flexion 25 deg, Extension 15 deg, Side Bending to 35 deg bilaterally,  Rotation to 45 deg bilaterally  SLR laying: Negative  XSLR laying: Negative  Palpable tenderness: Tender to palpation of paraspinal musculature lumbar spine right greater than left. FABER: Tightness on the right. Sensory change: Gross sensation intact to all lumbar and sacral dermatomes.  Reflexes: 2+ at both patellar tendons, 2+ at achilles tendons, Babinski's downgoing.  Strength at foot  Plantar-flexion: 5/5 Dorsi-flexion: 5/5 Eversion: 5/5 Inversion: 5/5  Leg strength  Quad: 5/5 Hamstring: 5/5 Hip flexor: 5/5 Hip abductors: 5/5  Gait unremarkable.  Right hand exam shows the patient is tender to palpation over the palmar aspect of the fifth finger at the MCP on the palmar aspect.  Seems to be cystic in nature.  Limited musculoskeletal ultrasound was  performed and interpreted by Lyndal Pulley  Limited ultrasound shows the patient does have a cystic formation around the soft tissue fifth finger.  This is superficial to the flexor tendon.  Osteopathic findings C2 flexed rotated and side bent right C7 flexed rotated and side bent left T3 extended rotated and side bent right inhaled third rib T9 extended rotated and side bent left L2 flexed rotated and side bent right Sacrum right on right    Impression and Recommendations:     This case required medical decision making of moderate complexity. The above documentation has been reviewed and is accurate and complete Lyndal Pulley, DO       Note: This dictation was prepared with Dragon dictation along with smaller phrase technology. Any transcriptional errors that result from this process are unintentional.

## 2019-03-09 NOTE — Assessment & Plan Note (Signed)
Discussed posture and ergonomics.  Discussed which activities of doing which wants to avoid.  Patient will monitor.  Follow-up again in 4 to 8 weeks

## 2019-03-09 NOTE — Assessment & Plan Note (Signed)
Decision today to treat with OMT was based on Physical Exam  After verbal consent patient was treated with HVLA, ME, FPR techniques in  thoracic, lumbar and sacral areas  Patient tolerated the procedure well with improvement in symptoms  Patient given exercises, stretches and lifestyle modifications  See medications in patient instructions if given  Patient will follow up in 4-6 weeks 

## 2019-03-17 ENCOUNTER — Ambulatory Visit: Payer: 59 | Admitting: Family Medicine

## 2019-04-06 ENCOUNTER — Ambulatory Visit (INDEPENDENT_AMBULATORY_CARE_PROVIDER_SITE_OTHER): Payer: 59 | Admitting: Family Medicine

## 2019-04-06 ENCOUNTER — Other Ambulatory Visit: Payer: Self-pay

## 2019-04-06 ENCOUNTER — Encounter: Payer: Self-pay | Admitting: Family Medicine

## 2019-04-06 VITALS — BP 148/92 | Ht 65.0 in | Wt 247.0 lb

## 2019-04-06 DIAGNOSIS — M999 Biomechanical lesion, unspecified: Secondary | ICD-10-CM | POA: Diagnosis not present

## 2019-04-06 DIAGNOSIS — M4306 Spondylolysis, lumbar region: Secondary | ICD-10-CM | POA: Diagnosis not present

## 2019-04-06 MED ORDER — TRAZODONE HCL 50 MG PO TABS: ORAL_TABLET | ORAL | 0 refills | Status: DC

## 2019-04-06 NOTE — Assessment & Plan Note (Signed)
Decision today to treat with OMT was based on Physical Exam  After verbal consent patient was treated with HVLA, ME, FPR techniques in  thoracic, lumbar and sacral areas  Patient tolerated the procedure well with improvement in symptoms  Patient given exercises, stretches and lifestyle modifications  See medications in patient instructions if given  Patient will follow up in 8 weeks 

## 2019-04-06 NOTE — Patient Instructions (Addendum)
Good to see you Stop melatonin  Trazodone 1/2-1 tablet at night 1200 mg at night helps with sleep Follow up with me in 6-8 weeks

## 2019-04-06 NOTE — Progress Notes (Signed)
[name] D.O. [facility] [address] [place], [place] [address] Phone: [phone] Subjective:   I Ronelle NighKana Thompson am serving as a Neurosurgeonscribe for Dr. Antoine PrimasZachary Adamary Savary.  I'm seeing this patient by the request  of:    CC: Back pain follow-up  BJY:NWGNFAOZHYHPI:Subjective  Kristin [name] is a 33 y.o. female coming in with complaint of back pain. Sometimes feels a pinch in her back if she moves too fast.  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  Avoiding any surgical intervention.  We will continue to trend work-up regular basis but finds it difficult secondary to some of the discomfort.  No new radiation of pain or any numbness.     Past Medical History:  Diagnosis Date   History of kidney stones    Kidney stone    Kidney stones    Kidney Stones   Knee joint pain    Pregnancy induced hypertension    Syncope    Past Surgical History:  Procedure Laterality Date   EXTRACORPOREAL SHOCK WAVE LITHOTRIPSY Left 01/30/2017   Procedure: LEFT EXTRACORPOREAL SHOCK WAVE LITHOTRIPSY (ESWL);  Surgeon: Malen GauzeMcKenzie, Patrick L, MD;  Location: [facility];  Service: Urology;  Laterality: Left;   KIDNEY STONE SURGERY     Social History   Socioeconomic History   Marital status: Married    Spouse name: Not on file   Number of children: Not on file   Years of education: Not on file   Highest education level: Not on file  Occupational History   Not on file  Social Needs   Financial resource strain: Not on file   Food insecurity    Worry: Not on file    Inability: Not on file   Transportation needs    Medical: Not on file    Non-medical: Not on file  Tobacco Use   Smoking status: Never Smoker   Smokeless tobacco: Never Used  Substance and Sexual Activity   Alcohol use: No   Drug use: No   Sexual activity: Not on file  Lifestyle   Physical activity    Days per week: Not on  file    Minutes per session: Not on file   Stress: Not on file  Relationships   Social connections    Talks on phone: Not on file    Gets together: Not on file    Attends religious service: Not on file    Active member of club or organization: Not on file    Attends meetings of clubs or organizations: Not on file    Relationship status: Not on file  Other Topics Concern   Not on file  Social History Narrative   Not on file   Allergies  Allergen Reactions   Amoxicillin Hives    Has patient had a PCN reaction causing immediate rash, facial/tongue/throat swelling, SOB or lightheadedness with hypotension: no Has patient had a PCN reaction causing severe rash involving mucus membranes or skin necrosis: No Has patient had a PCN reaction that required hospitalization No Has patient had a PCN reaction occurring within the last 10 years: No If all of the above answers are "NO", then may proceed with Cephalosporin use.   Family History  Problem Relation Age of Onset   CVA Mother    Hypertension Mother    Diabetes Mellitus II Mother    Congestive Heart Failure Mother  CVA Father    Hypertension Father    Diabetes Mellitus II Father     Current Outpatient Medications (Endocrine & Metabolic):    levothyroxine (SYNTHROID, LEVOTHROID) 50 MCG tablet, TAKE 1 TABLET BY MOUTH EVERY DAY   predniSONE (DELTASONE) 50 MG tablet, Take one tablet daily at breakfast    Current Outpatient Medications (Analgesics):    HYDROcodone-acetaminophen (NORCO/VICODIN) 5-325 MG tablet, Take 1-2 tablets by mouth every 6 (six) hours as needed.   Current Outpatient Medications (Other):    Ascorbic Acid (VITAMIN C) 500 MG CAPS, Take 500 mg by mouth daily.   Diclofenac Sodium (PENNSAID) 2 % SOLN, Place 2 g onto the skin 2 (two) times daily.   Diclofenac Sodium (PENNSAID) 2 % SOLN, Place 2 g onto the skin 2 (two) times daily.   diclofenac sodium (VOLTAREN) 1 % GEL, Apply 2 g topically 4  (four) times daily.   gabapentin (NEURONTIN) 100 MG capsule, Take 2 capsules (200 mg total) by mouth at bedtime.   lidocaine (LIDODERM) 5 %, Place 1 patch onto the skin daily. Remove & Discard patch within 12 hours or as directed by MD   methocarbamol (ROBAXIN) 500 MG tablet, Take 1 tablet (500 mg total) by mouth 2 (two) times daily.   Misc Natural Products (TART CHERRY ADVANCED PO), Take 3 tablets by mouth daily.   Multiple Vitamins-Minerals (MULTIVITAMIN ADULT PO), Take 1 tablet by mouth daily.   ondansetron (ZOFRAN ODT) 4 MG disintegrating tablet, Take 1 tablet (4 mg total) by mouth every 8 (eight) hours as needed for nausea or vomiting.   tiZANidine (ZANAFLEX) 4 MG tablet, Take 1 tablet (4 mg total) by mouth every 6 (six) hours as needed for muscle spasms.   Turmeric 500 MG CAPS, Take 500 mg by mouth daily.   venlafaxine XR (EFFEXOR XR) 150 MG 24 hr capsule, Take 1 capsule (150 mg total) by mouth daily with breakfast.   Vitamin D, Ergocalciferol, (DRISDOL) 50000 units CAPS capsule, TAKE 1 CAPSULE (50,000 UNITS TOTAL) BY MOUTH EVERY 7 (SEVEN) DAYS.   traZODone (DESYREL) 50 MG tablet, 1/2-1 tablet at bedtime    Past medical history, social, surgical and family history all reviewed in electronic medical record.  No pertanent information unless stated regarding to the chief complaint.   Review of Systems:  No headache, visual changes, nausea, vomiting, diarrhea, constipation , dizziness, abdominal pain, skin rash, fevers, chills, night sweats, weight loss, swollen lymph nodes, body aches, joint swelling, chest pain, shortness of breath, mood changes.  Positive muscle aches  Objective  Blood pressure (!) 148/92, height 5\' 5"  (1.651 m), weight 247 lb (112 kg), currently breastfeeding.    General: No apparent distress alert and oriented x3 mood and affect normal, dressed appropriately.  HEENT: Pupils equal, extraocular movements intact  Respiratory: Patient's speak in full sentences  and does not appear short of breath  Cardiovascular: No lower extremity edema, non tender, no erythema  Skin: Warm dry intact with no signs of infection or rash on extremities or on axial skeleton.  Abdomen: Soft nontender  Neuro: Cranial nerves II through XII are intact, neurovascularly intact in all extremities with 2+ DTRs and 2+ pulses.  Lymph: No lymphadenopathy of posterior or anterior cervical chain or axillae bilaterally.  Gait normal with good balance and coordination.  MSK:  Non tender with full range of motion and good stability and symmetric strength and tone of shoulders, elbows, wrist, hip, knee and ankles bilaterally.   Back Exam:  Inspection: Unremarkable  Motion:  Flexion 35 deg, Extension 25 deg, Side Bending to 35 deg bilaterally,  Rotation to 35 deg bilaterally  SLR laying: Negative  XSLR laying: Negative  Palpable tenderness: Tender to palpation paraspinal musculature lumbar spine right greater than left. FABER: Tightness bilaterally. Sensory change: Gross sensation intact to all lumbar and sacral dermatomes.  Reflexes: 2+ at both patellar tendons, 2+ at achilles tendons, Babinski's downgoing.  Strength at foot  Plantar-flexion: 5/5 Dorsi-flexion: 5/5 Eversion: 5/5 Inversion: 5/5  Leg strength  Quad: 5/5 Hamstring: 5/5 Hip flexor: 5/5 Hip abductors: 5/5    Osteopathic findings  T6 extended rotated and side bent left L2 flexed rotated and side bent right L4 flexed rotated and side bent left Sacrum right on right     Impression and Recommendations:     This case required medical decision making of moderate complexity. The above documentation has been reviewed and is accurate and complete Lyndal Pulley, DO       Note: This dictation was prepared with Dragon dictation along with smaller phrase technology. Any transcriptional errors that result from this process are unintentional.

## 2019-04-06 NOTE — Assessment & Plan Note (Signed)
Stable overall.  Conservative therapy.  Continue the same medications.  Having some difficulty with sleep.  At this point will discussed with patient about posture and ergonomics.  Discussed core strengthening.  Follow-up again in 6 to 8 weeks

## 2019-04-30 ENCOUNTER — Other Ambulatory Visit: Payer: Self-pay | Admitting: Family Medicine

## 2019-05-24 NOTE — Progress Notes (Signed)
[name] [place], [place] [address] Phone: [phone] Subjective:   Kristin [name], am serving as a scribe for Dr. Hulan Saas.   CC: Low back pain follow-up  [id number]  Kristin [name] is a 33 y.o. female coming in with complaint of low back pain.  Significant work-up showing some mild nerve impingement.  Patient has seen other providers and has had injections.  Has also done formal physical therapy 2 times.  Responding somewhat to manipulation.  Patient states that she is having some tightness in the left side of lumbar spine. Is having some tingling into the left glute as well.      Past Medical History:  Diagnosis Date  . History of kidney stones   . Kidney stone   . Kidney stones    Kidney Stones  . Knee joint pain   . Pregnancy induced hypertension   . Syncope    Past Surgical History:  Procedure Laterality Date  . EXTRACORPOREAL SHOCK WAVE LITHOTRIPSY Left 01/30/2017   Procedure: LEFT EXTRACORPOREAL SHOCK WAVE LITHOTRIPSY (ESWL);  Surgeon: Cleon Gustin, MD;  Location: [facility];  Service: Urology;  Laterality: Left;  . KIDNEY STONE SURGERY     Social History   Socioeconomic History  . Marital status: Married    Spouse name: Not on file  . Number of children: Not on file  . Years of education: Not on file  . Highest education level: Not on file  Occupational History  . Not on file  Social Needs  . Financial resource strain: Not on file  . Food insecurity    Worry: Not on file    Inability: Not on file  . Transportation needs    Medical: Not on file    Non-medical: Not on file  Tobacco Use  . Smoking status: Never Smoker  . Smokeless tobacco: Never Used  Substance and Sexual Activity  . Alcohol use: [name]  . Drug use: [name]  . Sexual activity: Not on file  Lifestyle  . Physical activity    Days per week: Not on file    Minutes per session: Not on file  . Stress: Not on file  Relationships  . Social  Herbalist on phone: Not on file    Gets together: Not on file    Attends religious service: Not on file    Active member of club or organization: Not on file    Attends meetings of clubs or organizations: Not on file    Relationship status: Not on file  Other Topics Concern  . Not on file  Social History Narrative  . Not on file   Allergies  Allergen Reactions  . Amoxicillin Hives    Has patient had a PCN reaction causing immediate rash, facial/tongue/throat swelling, SOB or lightheadedness with hypotension: [name] Has patient had a PCN reaction causing severe rash involving mucus membranes or skin necrosis: [name] Has patient had a PCN reaction that required hospitalization [name] Has patient had a PCN reaction occurring within the last 10 years: [name] If all of the above answers are "[name]", then may proceed with Cephalosporin use.   Family History  Problem Relation Age of Onset  . CVA Mother   . Hypertension Mother   . Diabetes Mellitus II Mother   . Congestive Heart Failure Mother   . CVA Father   . Hypertension Father   . Diabetes Mellitus II Father     Current  Outpatient Medications (Endocrine & Metabolic):  .  levothyroxine (SYNTHROID, LEVOTHROID) 50 MCG tablet, TAKE 1 TABLET BY MOUTH EVERY DAY .  predniSONE (DELTASONE) 50 MG tablet, Take one tablet daily at breakfast    Current Outpatient Medications (Analgesics):  .  HYDROcodone-acetaminophen (NORCO/VICODIN) 5-325 MG tablet, Take 1-2 tablets by mouth every 6 (six) hours as needed.   Current Outpatient Medications (Other):  Marland Kitchen.  Ascorbic Acid (VITAMIN C) 500 MG CAPS, Take 500 mg by mouth daily. .  Diclofenac Sodium (PENNSAID) 2 % SOLN, Place 2 g onto the skin 2 (two) times daily. .  Diclofenac Sodium (PENNSAID) 2 % SOLN, Place 2 g onto the skin 2 (two) times daily. .  diclofenac sodium (VOLTAREN) 1 % GEL, Apply 2 g topically 4 (four) times daily. Marland Kitchen.  gabapentin (NEURONTIN) 100 MG capsule, Take 2 capsules (200 mg total) by  mouth at bedtime. .  lidocaine (LIDODERM) 5 %, Place 1 patch onto the skin daily. Remove & Discard patch within 12 hours or as directed by MD .  methocarbamol (ROBAXIN) 500 MG tablet, Take 1 tablet (500 mg total) by mouth 2 (two) times daily. .  Misc Natural Products (TART CHERRY ADVANCED PO), Take 3 tablets by mouth daily. .  Multiple Vitamins-Minerals (MULTIVITAMIN ADULT PO), Take 1 tablet by mouth daily. .  ondansetron (ZOFRAN ODT) 4 MG disintegrating tablet, Take 1 tablet (4 mg total) by mouth every 8 (eight) hours as needed for nausea or vomiting. Marland Kitchen.  tiZANidine (ZANAFLEX) 4 MG tablet, Take 1 tablet (4 mg total) by mouth every 6 (six) hours as needed for muscle spasms. .  traZODone (DESYREL) 50 MG tablet, TAKE 1/2 TO 1 TABLET BY MOUTH AT BEDTIME .  Turmeric 500 MG CAPS, Take 500 mg by mouth daily. Marland Kitchen.  venlafaxine XR (EFFEXOR XR) 150 MG 24 hr capsule, Take 1 capsule (150 mg total) by mouth daily with breakfast. .  Vitamin D, Ergocalciferol, (DRISDOL) 50000 units CAPS capsule, TAKE 1 CAPSULE (50,000 UNITS TOTAL) BY MOUTH EVERY 7 (SEVEN) DAYS. .  hydrOXYzine (VISTARIL) 25 MG capsule, Take 1 capsule (25 mg total) by mouth at bedtime.    Past medical history, social, surgical and family history all reviewed in electronic medical record.  [name] pertanent information unless stated regarding to the chief complaint.   Review of Systems:  [name] headache, visual changes, nausea, vomiting, diarrhea, constipation, dizziness, abdominal pain, skin rash, fevers, chills, night sweats, weight loss, swollen lymph nodes, body aches, joint swelling,  chest pain, shortness of breath, mood changes.  Positive muscle aches  Objective  Blood pressure 112/84, pulse 70, height 5\' 5"  (1.651 m), weight 244 lb (110.7 kg), SpO2 99 %, currently breastfeeding.   General: [name] apparent distress alert and oriented x3 mood and affect normal, dressed appropriately.  HEENT: Pupils equal, extraocular movements intact  Respiratory:  Patient's speak in full sentences and does not appear short of breath  Cardiovascular: [name] lower extremity edema, non tender, [name] erythema  Skin: Warm dry intact with [name] signs of infection or rash on extremities or on axial skeleton.  Abdomen: Soft nontender  Neuro: Cranial nerves II through XII are intact, neurovascularly intact in all extremities with 2+ DTRs and 2+ pulses.  Lymph: [name] lymphadenopathy of posterior or anterior cervical chain or axillae bilaterally.  Gait normal with good balance and coordination.  MSK:  Non tender with full range of motion and good stability and symmetric strength and tone of shoulders, elbows, wrist, hip, knee and ankles bilaterally.   Back  exam shows some mild loss of lordosis.  Tender to palpation more on the left side of the paraspinal musculature lumbar spine.  Mild positive Faber negative straight leg test.  Worsening pain with extension.  Osteopathic findings   C7 flexed rotated and side bent left T3 extended rotated and side bent right inhaled third rib T7 extended rotated and side bent left L4 flexed rotated and side bent left Sacrum right on right     Impression and Recommendations:     This case required medical decision making of moderate complexity. The above documentation has been reviewed and is accurate and complete [name], DO       Note: This dictation was prepared with Dragon dictation along with smaller phrase technology. Any transcriptional errors that result from this process are unintentional.

## 2019-05-25 ENCOUNTER — Other Ambulatory Visit: Payer: Self-pay

## 2019-05-25 ENCOUNTER — Encounter: Payer: Self-pay | Admitting: Family Medicine

## 2019-05-25 ENCOUNTER — Ambulatory Visit (INDEPENDENT_AMBULATORY_CARE_PROVIDER_SITE_OTHER): Payer: 59 | Admitting: Family Medicine

## 2019-05-25 VITALS — BP 112/84 | HR 70 | Ht 65.0 in | Wt 244.0 lb

## 2019-05-25 DIAGNOSIS — M4306 Spondylolysis, lumbar region: Secondary | ICD-10-CM | POA: Diagnosis not present

## 2019-05-25 DIAGNOSIS — M999 Biomechanical lesion, unspecified: Secondary | ICD-10-CM | POA: Diagnosis not present

## 2019-05-25 MED ORDER — HYDROXYZINE PAMOATE 25 MG PO CAPS: 25.0000 mg | ORAL_CAPSULE | Freq: Every day | ORAL | 0 refills | Status: DC

## 2019-05-25 NOTE — Assessment & Plan Note (Signed)
Has been doing relatively well.  Discussed posture and ergonomics again in greater than detail.  Discussed the importance of weight loss.  Follow-up again in 4 to 8 weeks

## 2019-05-25 NOTE — Assessment & Plan Note (Signed)
Decision today to treat with OMT was based on Physical Exam  After verbal consent patient was treated with HVLA, ME, FPR techniques in  thoracic, lumbar and sacral areas  Patient tolerated the procedure well with improvement in symptoms  Patient given exercises, stretches and lifestyle modifications  See medications in patient instructions if given  Patient will follow up in 4-8 weeks 

## 2019-05-25 NOTE — Patient Instructions (Addendum)
Keep up with exercises Called in hydroxizine See me again in 6 weeks

## 2019-06-27 ENCOUNTER — Other Ambulatory Visit: Payer: Self-pay | Admitting: Family Medicine

## 2019-07-05 NOTE — Progress Notes (Signed)
Kristin ScaleZach Tashauna [name] D.O. [facility] [address] [place], [place] [address] Phone: [phone] Subjective:   I Kristin NighKana [name] am serving as a Neurosurgeonscribe for Dr. Antoine PrimasZachary Tericka [name].  I'm seeing this patient by the request  of:    CC: Low back pain follow-up  BJY:[id number]:Subjective   05/25/2019 Has been doing relatively well.  Discussed posture and ergonomics again in greater than detail.  Discussed the importance of weight loss.  Follow-up again in 4 to 8 weeks  Update 07/06/2019 Kristin GlasgowLauren G [name] is a 33 y.o. female coming in with complaint of back pain. Patient states she is doing well. Pain is not as bad as before. 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  Patient continues on the same medications.  Patient still avoid any surgical intervention and does not feel that the injections at this moment would be beneficial.  Discussed which activities of doing which will still avoid.       Past Medical History:  Diagnosis Date   History of kidney stones    Kidney stone    Kidney stones    Kidney Stones   Knee joint pain    Pregnancy induced hypertension    Syncope    Past Surgical History:  Procedure Laterality Date   EXTRACORPOREAL SHOCK WAVE LITHOTRIPSY Left 01/30/2017   Procedure: LEFT EXTRACORPOREAL SHOCK WAVE LITHOTRIPSY (ESWL);  Surgeon: Malen GauzeMcKenzie, Patrick L, MD;  Location: [facility];  Service: Urology;  Laterality: Left;   KIDNEY STONE SURGERY     Social History   Socioeconomic History   Marital status: Married    Spouse name: Not on file   Number of children: Not on file   Years of education: Not on file   Highest education level: Not on file  Occupational History   Not on file  Social Needs   Financial resource strain: Not on file   Food insecurity    Worry: Not on file    Inability: Not on file   Transportation needs    Medical: Not on file    Non-medical: Not on file  Tobacco Use   Smoking status: Never Smoker   Smokeless tobacco: Never Used  Substance  and Sexual Activity   Alcohol use: No   Drug use: No   Sexual activity: Not on file  Lifestyle   Physical activity    Days per week: Not on file    Minutes per session: Not on file   Stress: Not on file  Relationships   Social connections    Talks on phone: Not on file    Gets together: Not on file    Attends religious service: Not on file    Active member of club or organization: Not on file    Attends meetings of clubs or organizations: Not on file    Relationship status: Not on file  Other Topics Concern   Not on file  Social History Narrative   Not on file   Allergies  Allergen Reactions   Amoxicillin Hives    Has patient had a PCN reaction causing immediate rash, facial/tongue/throat swelling, SOB or lightheadedness with hypotension: no Has patient had a PCN reaction causing severe rash involving mucus membranes or skin necrosis: No Has patient had a PCN reaction that required hospitalization No Has patient had a PCN reaction occurring within the last 10 years: No If all of the above answers are "NO", then may proceed with Cephalosporin use.   Family History  Problem Relation Age  of Onset   CVA Mother    Hypertension Mother    Diabetes Mellitus II Mother    Congestive Heart Failure Mother    CVA Father    Hypertension Father    Diabetes Mellitus II Father     Current Outpatient Medications (Endocrine & Metabolic):    levothyroxine (SYNTHROID, LEVOTHROID) 50 MCG tablet, TAKE 1 TABLET BY MOUTH EVERY DAY   predniSONE (DELTASONE) 50 MG tablet, Take one tablet daily at breakfast    Current Outpatient Medications (Analgesics):    HYDROcodone-acetaminophen (NORCO/VICODIN) 5-325 MG tablet, Take 1-2 tablets by mouth every 6 (six) hours as needed.   Current Outpatient Medications (Other):    Ascorbic Acid (VITAMIN C) 500 MG CAPS, Take 500 mg by mouth daily.   Diclofenac Sodium (PENNSAID) 2 % SOLN, Place 2 g onto the skin 2 (two) times daily.    Diclofenac Sodium (PENNSAID) 2 % SOLN, Place 2 g onto the skin 2 (two) times daily.   diclofenac sodium (VOLTAREN) 1 % GEL, Apply 2 g topically 4 (four) times daily.   gabapentin (NEURONTIN) 100 MG capsule, Take 2 capsules (200 mg total) by mouth at bedtime.   hydrOXYzine (VISTARIL) 25 MG capsule, TAKE 1 CAPSULE BY MOUTH AT BEDTIME   lidocaine (LIDODERM) 5 %, Place 1 patch onto the skin daily. Remove & Discard patch within 12 hours or as directed by MD   methocarbamol (ROBAXIN) 500 MG tablet, Take 1 tablet (500 mg total) by mouth 2 (two) times daily.   Misc Natural Products (TART CHERRY ADVANCED PO), Take 3 tablets by mouth daily.   Multiple Vitamins-Minerals (MULTIVITAMIN ADULT PO), Take 1 tablet by mouth daily.   ondansetron (ZOFRAN ODT) 4 MG disintegrating tablet, Take 1 tablet (4 mg total) by mouth every 8 (eight) hours as needed for nausea or vomiting.   tiZANidine (ZANAFLEX) 4 MG tablet, Take 1 tablet (4 mg total) by mouth every 6 (six) hours as needed for muscle spasms.   Turmeric 500 MG CAPS, Take 500 mg by mouth daily.   venlafaxine XR (EFFEXOR XR) 150 MG 24 hr capsule, Take 1 capsule (150 mg total) by mouth daily with breakfast.   Vitamin D, Ergocalciferol, (DRISDOL) 50000 units CAPS capsule, TAKE 1 CAPSULE (50,000 UNITS TOTAL) BY MOUTH EVERY 7 (SEVEN) DAYS.   traZODone (DESYREL) 50 MG tablet, TAKE 1/2 TO 1 TABLET BY MOUTH AT BEDTIME    Past medical history, social, surgical and family history all reviewed in electronic medical record.  No pertanent information unless stated regarding to the chief complaint.   Review of Systems:  No headache, visual changes, nausea, vomiting, diarrhea, constipation, dizziness, abdominal pain, skin rash, fevers, chills, night sweats, weight loss, swollen lymph nodes, body aches, joint swelling, muscle aches, chest pain, shortness of breath, mood changes.   Objective  Blood pressure (!) 154/90, pulse 65, height 5\' 5"  (1.651 m), weight 248  lb (112.5 kg), SpO2 98 %, currently breastfeeding.    General: No apparent distress alert and oriented x3 mood and affect normal, dressed appropriately.  HEENT: Pupils equal, extraocular movements intact  Respiratory: Patient's speak in full sentences and does not appear short of breath  Cardiovascular: No lower extremity edema, non tender, no erythema  Skin: Warm dry intact with no signs of infection or rash on extremities or on axial skeleton.  Abdomen: Soft nontender  Neuro: Cranial nerves II through XII are intact, neurovascularly intact in all extremities with 2+ DTRs and 2+ pulses.  Lymph: No lymphadenopathy of posterior or  anterior cervical chain or axillae bilaterally.  Gait normal with good balance and coordination.  MSK:  Non tender with full range of motion and good stability and symmetric strength and tone of shoulders, elbows, wrist, hip, knee and ankles bilaterally.  Low back exam does have some mild loss of lordosis, some tender to palpation in the paraspinal musculature.  Patient does have some pain with extension of the back more in the L5-S1 area bilaterally.  Mild positive Faber bilaterally.  Osteopathic findings  T9 extended rotated and side bent left L2 flexed rotated and side bent right Sacrum right on right    Impression and Recommendations:     This case required medical decision making of moderate complexity. The above documentation has been reviewed and is accurate and complete [name], DO       Note: This dictation was prepared with Dragon dictation along with smaller phrase technology. Any transcriptional errors that result from this process are unintentional.

## 2019-07-06 ENCOUNTER — Ambulatory Visit (INDEPENDENT_AMBULATORY_CARE_PROVIDER_SITE_OTHER): Payer: 59 | Admitting: Family Medicine

## 2019-07-06 ENCOUNTER — Encounter: Payer: Self-pay | Admitting: Family Medicine

## 2019-07-06 ENCOUNTER — Other Ambulatory Visit: Payer: Self-pay

## 2019-07-06 VITALS — BP 154/90 | HR 65 | Ht 65.0 in | Wt 248.0 lb

## 2019-07-06 DIAGNOSIS — M533 Sacrococcygeal disorders, not elsewhere classified: Secondary | ICD-10-CM

## 2019-07-06 DIAGNOSIS — M999 Biomechanical lesion, unspecified: Secondary | ICD-10-CM

## 2019-07-06 MED ORDER — TRAZODONE HCL 50 MG PO TABS: ORAL_TABLET | ORAL | 1 refills | Status: DC

## 2019-07-06 NOTE — Patient Instructions (Signed)
voltaren gel over the counter See me again in 6 weeks

## 2019-07-06 NOTE — Assessment & Plan Note (Signed)
Decision today to treat with OMT was based on Physical Exam  After verbal consent patient was treated with HVLA, ME, FPR techniques in , thoracic, lumbar and sacral areas  Patient tolerated the procedure well with improvement in symptoms  Patient given exercises, stretches and lifestyle modifications  See medications in patient instructions if given  Patient will follow up in 6-8 weeks 

## 2019-07-06 NOTE — Assessment & Plan Note (Signed)
Continues to respond fairly well to osteopathic manipulation.  We discussed posture and ergonomics, patient has been improving a little bit with her core strength.  Patient is having some mild discomfort and pain but nothing as severe as what it has been in the past.  Follow-up again in 6 to 8 weeks

## 2019-08-17 ENCOUNTER — Encounter: Payer: Self-pay | Admitting: Family Medicine

## 2019-08-17 ENCOUNTER — Ambulatory Visit (INDEPENDENT_AMBULATORY_CARE_PROVIDER_SITE_OTHER): Payer: 59 | Admitting: Family Medicine

## 2019-08-17 ENCOUNTER — Other Ambulatory Visit: Payer: Self-pay

## 2019-08-17 VITALS — BP 112/70 | HR 63 | Wt 245.0 lb

## 2019-08-17 DIAGNOSIS — M7052 Other bursitis of knee, left knee: Secondary | ICD-10-CM | POA: Diagnosis not present

## 2019-08-17 DIAGNOSIS — M999 Biomechanical lesion, unspecified: Secondary | ICD-10-CM | POA: Diagnosis not present

## 2019-08-17 DIAGNOSIS — M7051 Other bursitis of knee, right knee: Secondary | ICD-10-CM | POA: Diagnosis not present

## 2019-08-17 MED ORDER — DICLOFENAC SODIUM 2 % EX SOLN: 2.0000 g | Freq: Two times a day (BID) | CUTANEOUS | 3 refills | Status: DC

## 2019-08-17 NOTE — Progress Notes (Signed)
Tawana ScaleZach Catera Hankins D.O. [facility] [address] [place], [place] [address] Phone: [phone] Subjective:   [name], [name], am serving as a scribe for Dr. Antoine PrimasZachary Keneth Borg.  This visit occurred during the SARS-CoV-2 public health emergency.  Safety protocols were in place, including screening questions prior to the visit, additional usage of staff PPE, and extensive cleaning of exam room while observing appropriate contact time as indicated for disinfecting solutions.     CC:  Low back pain and bilateral knee pain BJY:[id number]:Subjective  Kristin [name] is a 33 y.o. female coming in with complaint of back pain. Patient is here for OMT. Patient last seen on 07/06/2019. Back pain is doing good since last visit.   Patient is having sharp pain with standing and walking. Pain is medial aspect. Is moving into a new house and has been doing a lot of squatting. Last seen for knee pain on 07/03/2018.  Patient states it is different.  Has been going up and down stairs on numerous basis.  Patient has noticed this in a more regular basis and states that it hurts more on the medial aspect.     Past Medical History:  Diagnosis Date  . History of kidney stones   . Kidney stone   . Kidney stones    Kidney Stones  . Knee joint pain   . Pregnancy induced hypertension   . Syncope    Past Surgical History:  Procedure Laterality Date  . EXTRACORPOREAL SHOCK WAVE LITHOTRIPSY Left 01/30/2017   Procedure: LEFT EXTRACORPOREAL SHOCK WAVE LITHOTRIPSY (ESWL);  Surgeon: Malen GauzeMcKenzie, Patrick L, MD;  Location: [facility];  Service: Urology;  Laterality: Left;  . KIDNEY STONE SURGERY     Social History   Socioeconomic History  . Marital status: Married    Spouse name: Not on file  . Number of children: Not on file  . Years of education: Not on file  . Highest education level: Not on file  Occupational History  . Not on file  Social Needs  . Financial resource strain: Not on file  . Food insecurity    Worry:  Not on file    Inability: Not on file  . Transportation needs    Medical: Not on file    Non-medical: Not on file  Tobacco Use  . Smoking status: Never Smoker  . Smokeless tobacco: Never Used  Substance and Sexual Activity  . Alcohol use: No  . Drug use: No  . Sexual activity: Not on file  Lifestyle  . Physical activity    Days per week: Not on file    Minutes per session: Not on file  . Stress: Not on file  Relationships  . Social Musicianconnections    Talks on phone: Not on file    Gets together: Not on file    Attends religious service: Not on file    Active member of club or organization: Not on file    Attends meetings of clubs or organizations: Not on file    Relationship status: Not on file  Other Topics Concern  . Not on file  Social History Narrative  . Not on file   Allergies  Allergen Reactions  . Amoxicillin Hives    Has patient had a PCN reaction causing immediate rash, facial/tongue/throat swelling, SOB or lightheadedness with hypotension: no Has patient had a PCN reaction causing severe rash involving mucus membranes or skin necrosis: No Has patient had a PCN reaction that required hospitalization No Has patient  had a PCN reaction occurring within the last 10 years: No If all of the above answers are "NO", then may proceed with Cephalosporin use.   Family History  Problem Relation Age of Onset  . CVA Mother   . Hypertension Mother   . Diabetes Mellitus II Mother   . Congestive Heart Failure Mother   . CVA Father   . Hypertension Father   . Diabetes Mellitus II Father     Current Outpatient Medications (Endocrine & Metabolic):  .  levothyroxine (SYNTHROID, LEVOTHROID) 50 MCG tablet, TAKE 1 TABLET BY MOUTH EVERY DAY .  predniSONE (DELTASONE) 50 MG tablet, Take one tablet daily at breakfast    Current Outpatient Medications (Analgesics):  .  HYDROcodone-acetaminophen (NORCO/VICODIN) 5-325 MG tablet, Take 1-2 tablets by mouth every 6 (six) hours as needed.    Current Outpatient Medications (Other):  Marland Kitchen  Ascorbic Acid (VITAMIN C) 500 MG CAPS, Take 500 mg by mouth daily. .  Diclofenac Sodium (PENNSAID) 2 % SOLN, Place 2 g onto the skin 2 (two) times daily. .  Diclofenac Sodium (PENNSAID) 2 % SOLN, Place 2 g onto the skin 2 (two) times daily. .  diclofenac sodium (VOLTAREN) 1 % GEL, Apply 2 g topically 4 (four) times daily. Marland Kitchen  gabapentin (NEURONTIN) 100 MG capsule, Take 2 capsules (200 mg total) by mouth at bedtime. .  hydrOXYzine (VISTARIL) 25 MG capsule, TAKE 1 CAPSULE BY MOUTH AT BEDTIME .  lidocaine (LIDODERM) 5 %, Place 1 patch onto the skin daily. Remove & Discard patch within 12 hours or as directed by MD .  methocarbamol (ROBAXIN) 500 MG tablet, Take 1 tablet (500 mg total) by mouth 2 (two) times daily. .  Misc Natural Products (TART CHERRY ADVANCED PO), Take 3 tablets by mouth daily. .  Multiple Vitamins-Minerals (MULTIVITAMIN ADULT PO), Take 1 tablet by mouth daily. .  ondansetron (ZOFRAN ODT) 4 MG disintegrating tablet, Take 1 tablet (4 mg total) by mouth every 8 (eight) hours as needed for nausea or vomiting. Marland Kitchen  tiZANidine (ZANAFLEX) 4 MG tablet, Take 1 tablet (4 mg total) by mouth every 6 (six) hours as needed for muscle spasms. .  traZODone (DESYREL) 50 MG tablet, TAKE 1/2 TO 1 TABLET BY MOUTH AT BEDTIME .  Turmeric 500 MG CAPS, Take 500 mg by mouth daily. Marland Kitchen  venlafaxine XR (EFFEXOR XR) 150 MG 24 hr capsule, Take 1 capsule (150 mg total) by mouth daily with breakfast. .  Vitamin D, Ergocalciferol, (DRISDOL) 50000 units CAPS capsule, TAKE 1 CAPSULE (50,000 UNITS TOTAL) BY MOUTH EVERY 7 (SEVEN) DAYS.    Past medical history, social, surgical and family history all reviewed in electronic medical record.  No pertanent information unless stated regarding to the chief complaint.   Review of Systems:  No headache, visual changes, nausea, vomiting, diarrhea, constipation, dizziness, abdominal pain, skin rash, fevers, chills, night sweats,  weight loss, swollen lymph nodes, body aches, joint swelling, muscle aches, chest pain, shortness of breath, mood changes.   Objective  currently breastfeeding. Systems examined below as of    General: No apparent distress alert and oriented x3 mood and affect normal, dressed appropriately.  HEENT: Pupils equal, extraocular movements intact  Respiratory: Patient's speak in full sentences and does not appear short of breath  Cardiovascular: No lower extremity edema, non tender, no erythema  Skin: Warm dry intact with no signs of infection or rash on extremities or on axial skeleton.  Abdomen: Soft nontender  Neuro: Cranial nerves II through XII are  intact, neurovascularly intact in all extremities with 2+ DTRs and 2+ pulses.  Lymph: No lymphadenopathy of posterior or anterior cervical chain or axillae bilaterally.  Gait normal with good balance and coordination.  MSK:  Non tender with full range of motion and good stability and symmetric strength and tone of shoulders, elbows, wrist, hip, knee and ankles bilaterally.  Bilateral knee exam shows severe pain over the pes anserine which more than the medial joint space.  Tightness of the hamstrings bilaterally.  Patient though patella appears to be unremarkable.  Plica still noted on the right knee.  Back exam shows mild loss of lordosis.  Patient does have some tenderness in the paraspinal musculature right greater than left in the L4-L5 area and L5-S1 areas.  Tightness with Pearlean Brownie but negative straight leg test more pain in the hamstrings noted with the straight leg test at the knee  Osteopathic findings   T9 extended rotated and side bent left L2 flexed rotated and side bent right Sacrum right on right     Impression and Recommendations:     This case required medical decision making of moderate complexity. The above documentation has been reviewed and is accurate and complete Wilford Grist       Note: This dictation was prepared  with Dragon dictation along with smaller phrase technology. Any transcriptional errors that result from this process are unintentional.

## 2019-08-17 NOTE — Assessment & Plan Note (Signed)
Decision today to treat with OMT was based on Physical Exam  After verbal consent patient was treated with HVLA, ME, FPR techniques in  thoracic, lumbar and sacral areas  Patient tolerated the procedure well with improvement in symptoms  Patient given exercises, stretches and lifestyle modifications  See medications in patient instructions if given  Patient will follow up in 4-8 weeks 

## 2019-08-17 NOTE — Assessment & Plan Note (Signed)
Patient has been moving more frequently.  Discussed topical anti-inflammatories, thigh compression sleeve, which activities to do which was to avoid.  Patient will increase activity slowly over the course of next several weeks.  Continue see medication.  Worsening symptoms will consider injections or formal physical therapy.

## 2019-08-17 NOTE — Assessment & Plan Note (Signed)
Doing relatively well, did have some tightness recently.  Patient does have some baseline regimen given.  Topical anti-inflammatories, gabapentin.  We discussed which activities to do which was to avoid.  Follow-up again in 4 to 8 weeks

## 2019-08-17 NOTE — Patient Instructions (Addendum)
Pennsaid 2x a day as needed Exercises 3x a week See me again in 6 weeks

## 2019-09-24 DIAGNOSIS — G35 Multiple sclerosis: Secondary | ICD-10-CM

## 2019-09-24 HISTORY — DX: Multiple sclerosis: G35

## 2019-09-29 ENCOUNTER — Ambulatory Visit: Payer: 59 | Admitting: Family Medicine

## 2019-10-18 ENCOUNTER — Ambulatory Visit (INDEPENDENT_AMBULATORY_CARE_PROVIDER_SITE_OTHER): Payer: Managed Care, Other (non HMO)

## 2019-10-18 ENCOUNTER — Ambulatory Visit: Payer: 59 | Admitting: Family Medicine

## 2019-10-18 ENCOUNTER — Encounter: Payer: Self-pay | Admitting: Family Medicine

## 2019-10-18 ENCOUNTER — Other Ambulatory Visit: Payer: Self-pay

## 2019-10-18 VITALS — BP 142/90 | HR 81 | Ht 65.0 in | Wt 243.0 lb

## 2019-10-18 DIAGNOSIS — M79644 Pain in right finger(s): Secondary | ICD-10-CM | POA: Diagnosis not present

## 2019-10-18 DIAGNOSIS — M654 Radial styloid tenosynovitis [de Quervain]: Secondary | ICD-10-CM

## 2019-10-18 DIAGNOSIS — M79645 Pain in left finger(s): Secondary | ICD-10-CM | POA: Diagnosis not present

## 2019-10-18 DIAGNOSIS — M533 Sacrococcygeal disorders, not elsewhere classified: Secondary | ICD-10-CM

## 2019-10-18 DIAGNOSIS — M999 Biomechanical lesion, unspecified: Secondary | ICD-10-CM

## 2019-10-18 HISTORY — DX: Radial styloid tenosynovitis (de quervain): M65.4

## 2019-10-18 MED ORDER — MELOXICAM 7.5 MG PO TABS: 7.5000 mg | ORAL_TABLET | Freq: Every day | ORAL | 0 refills | Status: DC

## 2019-10-18 NOTE — Patient Instructions (Signed)
Meloxicam daily for 10 days then as needed Sleep in a brace if you can Exercise 3 times a week See me again in 4-6 weeks

## 2019-10-18 NOTE — Assessment & Plan Note (Signed)
Discussed HEP, icing regimen, discussed avoiding certain activities. Chronic problem with exacerbation  RTC in 4 weeks

## 2019-10-18 NOTE — Progress Notes (Signed)
[facility] [address] [place] Phone: [phone] Subjective:   Kristin [name] am serving as a Education administrator for Dr. Hulan Saas.  This visit occurred during the SARS-CoV-2 public health emergency.  Safety protocols were in place, including screening questions prior to the visit, additional usage of staff PPE, and extensive cleaning of exam room while observing appropriate contact time as indicated for disinfecting solutions.   Kristin'm seeing this patient by the request  of:  Patient, No Pcp Per  CC: Right wrist pain, back pain  [id number]  Ivyonna LUARA [name] is a 34 y.o. female coming in with complaint of back pain. Last seen on 08/17/2019 for OMT. Patient states she is also having bilateral thumb pain. Pain radiates into the wrist. Pain started 2 weeks. Numbness and tingling bilaterally.       Past Medical History:  Diagnosis Date  . History of kidney stones   . Kidney stone   . Kidney stones    Kidney Stones  . Knee joint pain   . Pregnancy induced hypertension   . Syncope    Past Surgical History:  Procedure Laterality Date  . EXTRACORPOREAL SHOCK WAVE LITHOTRIPSY Left 01/30/2017   Procedure: LEFT EXTRACORPOREAL SHOCK WAVE LITHOTRIPSY (ESWL);  Surgeon: Cleon Gustin, MD;  Location: [facility];  Service: Urology;  Laterality: Left;  . KIDNEY STONE SURGERY     Social History   Socioeconomic History  . Marital status: Married    Spouse name: Not on file  . Number of children: Not on file  . Years of education: Not on file  . Highest education level: Not on file  Occupational History  . Not on file  Tobacco Use  . Smoking status: Never Smoker  . Smokeless tobacco: Never Used  Substance and Sexual Activity  . Alcohol use: No  . Drug use: No  . Sexual activity: Not on file  Other Topics Concern  . Not on file  Social History Narrative  . Not on file   Social Determinants of Health   Financial Resource Strain:   .  Difficulty of Paying Living Expenses: Not on file  Food Insecurity:   . Worried About Charity fundraiser in the Last Year: Not on file  . Ran Out of Food in the Last Year: Not on file  Transportation Needs:   . Lack of Transportation (Medical): Not on file  . Lack of Transportation (Non-Medical): Not on file  Physical Activity:   . Days of Exercise per Week: Not on file  . Minutes of Exercise per Session: Not on file  Stress:   . Feeling of Stress : Not on file  Social Connections:   . Frequency of Communication with Friends and Family: Not on file  . Frequency of Social Gatherings with Friends and Family: Not on file  . Attends Religious Services: Not on file  . Active Member of Clubs or Organizations: Not on file  . Attends Archivist Meetings: Not on file  . Marital Status: Not on file   Allergies  Allergen Reactions  . Amoxicillin Hives    Has patient had a PCN reaction causing immediate rash, facial/tongue/throat swelling, SOB or lightheadedness with hypotension: no Has patient had a PCN reaction causing severe rash involving mucus membranes or skin necrosis: No Has patient had a PCN reaction that required hospitalization No Has patient had a PCN reaction occurring within the last 10 years: No If all of the  above answers are "NO", then may proceed with Cephalosporin use.   Family History  Problem Relation Age of Onset  . CVA Mother   . Hypertension Mother   . Diabetes Mellitus II Mother   . Congestive Heart Failure Mother   . CVA Father   . Hypertension Father   . Diabetes Mellitus II Father     Current Outpatient Medications (Endocrine & Metabolic):  .  levothyroxine (SYNTHROID, LEVOTHROID) 50 MCG tablet, TAKE 1 TABLET BY MOUTH EVERY DAY .  predniSONE (DELTASONE) 50 MG tablet, Take one tablet daily at breakfast    Current Outpatient Medications (Analgesics):  .  HYDROcodone-acetaminophen (NORCO/VICODIN) 5-325 MG tablet, Take 1-2 tablets by mouth every 6  (six) hours as needed. .  meloxicam (MOBIC) 7.5 MG tablet, Take 1 tablet (7.5 mg total) by mouth daily.   Current Outpatient Medications (Other):  Marland Kitchen  Ascorbic Acid (VITAMIN C) 500 MG CAPS, Take 500 mg by mouth daily. .  Diclofenac Sodium (PENNSAID) 2 % SOLN, Place 2 g onto the skin 2 (two) times daily. .  Diclofenac Sodium (PENNSAID) 2 % SOLN, Place 2 g onto the skin 2 (two) times daily. .  diclofenac sodium (VOLTAREN) 1 % GEL, Apply 2 g topically 4 (four) times daily. .  Diclofenac Sodium 2 % SOLN, Place 2 g onto the skin 2 (two) times daily. Marland Kitchen  gabapentin (NEURONTIN) 100 MG capsule, Take 2 capsules (200 mg total) by mouth at bedtime. .  hydrOXYzine (VISTARIL) 25 MG capsule, TAKE 1 CAPSULE BY MOUTH AT BEDTIME .  lidocaine (LIDODERM) 5 %, Place 1 patch onto the skin daily. Remove & Discard patch within 12 hours or as directed by MD .  methocarbamol (ROBAXIN) 500 MG tablet, Take 1 tablet (500 mg total) by mouth 2 (two) times daily. .  Misc Natural Products (TART CHERRY ADVANCED PO), Take 3 tablets by mouth daily. .  Multiple Vitamins-Minerals (MULTIVITAMIN ADULT PO), Take 1 tablet by mouth daily. .  ondansetron (ZOFRAN ODT) 4 MG disintegrating tablet, Take 1 tablet (4 mg total) by mouth every 8 (eight) hours as needed for nausea or vomiting. Marland Kitchen  tiZANidine (ZANAFLEX) 4 MG tablet, Take 1 tablet (4 mg total) by mouth every 6 (six) hours as needed for muscle spasms. .  traZODone (DESYREL) 50 MG tablet, TAKE 1/2 TO 1 TABLET BY MOUTH AT BEDTIME .  Turmeric 500 MG CAPS, Take 500 mg by mouth daily. Marland Kitchen  venlafaxine XR (EFFEXOR XR) 150 MG 24 hr capsule, Take 1 capsule (150 mg total) by mouth daily with breakfast. .  Vitamin D, Ergocalciferol, (DRISDOL) 50000 units CAPS capsule, TAKE 1 CAPSULE (50,000 UNITS TOTAL) BY MOUTH EVERY 7 (SEVEN) DAYS.    Past medical history, social, surgical and family history all reviewed in electronic medical record.  No pertanent information unless stated regarding to the  chief complaint.   Review of Systems:  No headache, visual changes, nausea, vomiting, diarrhea, constipation, dizziness, abdominal pain, skin rash, fevers, chills, night sweats, weight loss, swollen lymph nodes, , joint swelling, chest pain, shortness of breath, mood changes. POSITIVE muscle aches, body aches  Objective  Blood pressure (!) 142/90, pulse 81, height 5\' 5"  (1.651 m), weight 243 lb (110.2 kg), SpO2 95 %, currently breastfeeding.   General: No apparent distress alert and oriented x3 mood and affect normal, dressed appropriately.  HEENT: Pupils equal, extraocular movements intact  Respiratory: Patient's speak in full sentences and does not appear short of breath  Cardiovascular: No lower extremity edema, non tender,  no erythema  Skin: Warm dry intact with no signs of infection or rash on extremities or on axial skeleton.  Abdomen: Soft nontender  Neuro: Cranial nerves II through XII are intact, neurovascularly intact in all extremities with 2+ DTRs and 2+ pulses.  Lymph: No lymphadenopathy of posterior or anterior cervical chain or axillae bilaterally.  Gait antalgic MSK:  tender with mild mild limited range of motion and good stability and symmetric strength and tone of shoulders, elbows,  hip, knee and ankles bilaterally.  Right wrist exam shows the patient does have a positive Finkelstein's.  Patient has negative grind test.  Good grip strength noted.  Full range of motion of the wrist.  Back exam does have some loss of lordosis.  Tender to palpation in the paraspinal musculature lumbar spine.  Tightness noted of the sacroiliac joint and L4-L5 5 paraspinal musculature.  Bilateral.  Worsening pain with extension.  Limited musculoskeletal ultrasound was performed and interpreted by [name]  Limited ultrasound of patient's right wrist shows the patient does have a positive hypoechoic changes within the abductor pollicis longus tendon sheath.  No true tear appreciated.  No  significant arthritic changes of the CMC joint. Impression: De Quervain's tenosynovitis  Osteopathic findings  T9 extended rotated and side bent left L2 flexed rotated and side bent right Sacrum right on right    Impression and Recommendations:     This case required medical decision making of moderate complexity. The above documentation has been reviewed and is accurate and complete [name], DO       Note: This dictation was prepared with Dragon dictation along with smaller phrase technology. Any transcriptional errors that result from this process are unintentional.

## 2019-10-18 NOTE — Assessment & Plan Note (Signed)
New problem, prescribed vitamin D, and prescribed bracing at night, discussed icing regimen and home exercises, follow-up again in 4 to 8 weeks

## 2019-10-18 NOTE — Assessment & Plan Note (Signed)
Decision today to treat with OMT was based on Physical Exam  After verbal consent patient was treated with HVLA, ME, FPR techniques in  thoracic, lumbar and sacral areas  Patient tolerated the procedure well with improvement in symptoms  Patient given exercises, stretches and lifestyle modifications  See medications in patient instructions if given  Patient will follow up in 4-8 weeks 

## 2019-12-01 ENCOUNTER — Ambulatory Visit: Payer: Self-pay

## 2019-12-01 ENCOUNTER — Ambulatory Visit (INDEPENDENT_AMBULATORY_CARE_PROVIDER_SITE_OTHER): Payer: 59 | Admitting: Family Medicine

## 2019-12-01 VITALS — BP 124/82 | HR 50 | Ht 65.0 in | Wt 245.0 lb

## 2019-12-01 DIAGNOSIS — M4306 Spondylolysis, lumbar region: Secondary | ICD-10-CM

## 2019-12-01 DIAGNOSIS — I809 Phlebitis and thrombophlebitis of unspecified site: Secondary | ICD-10-CM

## 2019-12-01 DIAGNOSIS — M79642 Pain in left hand: Secondary | ICD-10-CM

## 2019-12-01 DIAGNOSIS — M999 Biomechanical lesion, unspecified: Secondary | ICD-10-CM

## 2019-12-01 MED ORDER — DOXYCYCLINE HYCLATE 100 MG PO TABS: 100.0000 mg | ORAL_TABLET | Freq: Two times a day (BID) | ORAL | 0 refills | Status: DC

## 2019-12-01 NOTE — Assessment & Plan Note (Signed)
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  Discussed which activities to do which wants to avoid.  Patient is to increase activity slowly.  Discussed icing regimen.  Chronic problem with very mild exacerbation.  Discussed avoiding certain activities.  Follow-up again in 4 to 8 weeks

## 2019-12-01 NOTE — Progress Notes (Signed)
[name] [address] [place] [address] Phone: [phone] Subjective:   Kristin [name], am serving as a scribe for Dr. Antoine Primas.. This visit occurred during the SARS-CoV-2 public health emergency.  Safety protocols were in place, including screening questions prior to the visit, additional usage of staff PPE, and extensive cleaning of exam room while observing appropriate contact time as indicated for disinfecting solutions.   CC: Low back pain follow-up, wrist pain follow-up  [id number]   10/18/2019 New problem, prescribed vitamin D, and prescribed bracing at night, discussed icing regimen and home exercises, follow-up again in 4 to 8 weeks  Update 12/01/2019 Kristin [name] is a 34 y.o. female coming in with complaint of right wrist pain, DeQuervains. Patient states that she is having continued tingling in the 3rd and 4th fingers on right hand. Thumb and wrist pain is improving but still has pain with brushing her teeth.  Had a student pull on middle finger 3 weeks ago. Felt a pop and noticed a bulge on the lateral aspect of PIP joint.  Continues to have back issues. Has not had any sharp pains since last visit.  Patient does have known facet arthropathy with a spondylosis patient continues to try to do the home exercises but finds it difficult.  Working 50 to 60 hours a week at the moment.     Past Medical History:  Diagnosis Date  . History of kidney stones   . Kidney stone   . Kidney stones    Kidney Stones  . Knee joint pain   . Pregnancy induced hypertension   . Syncope    Past Surgical History:  Procedure Laterality Date  . EXTRACORPOREAL SHOCK WAVE LITHOTRIPSY Left 01/30/2017   Procedure: LEFT EXTRACORPOREAL SHOCK WAVE LITHOTRIPSY (ESWL);  Surgeon: Malen Gauze, MD;  Location: [facility];  Service: Urology;  Laterality: Left;  . KIDNEY STONE SURGERY     Social History   Socioeconomic History  . Marital status:  Married    Spouse name: Not on file  . Number of children: Not on file  . Years of education: Not on file  . Highest education level: Not on file  Occupational History  . Not on file  Tobacco Use  . Smoking status: Never Smoker  . Smokeless tobacco: Never Used  Substance and Sexual Activity  . Alcohol use: No  . Drug use: No  . Sexual activity: Not on file  Other Topics Concern  . Not on file  Social History Narrative  . Not on file   Social Determinants of Health   Financial Resource Strain:   . Difficulty of Paying Living Expenses: Not on file  Food Insecurity:   . Worried About Programme researcher, broadcasting/film/video in the Last Year: Not on file  . Ran Out of Food in the Last Year: Not on file  Transportation Needs:   . Lack of Transportation (Medical): Not on file  . Lack of Transportation (Non-Medical): Not on file  Physical Activity:   . Days of Exercise per Week: Not on file  . Minutes of Exercise per Session: Not on file  Stress:   . Feeling of Stress : Not on file  Social Connections:   . Frequency of Communication with Friends and Family: Not on file  . Frequency of Social Gatherings with Friends and Family: Not on file  . Attends Religious Services: Not on file  . Active Member of Clubs or Organizations: Not on  file  . Attends Archivist Meetings: Not on file  . Marital Status: Not on file   Allergies  Allergen Reactions  . Amoxicillin Hives    Has patient had a PCN reaction causing immediate rash, facial/tongue/throat swelling, SOB or lightheadedness with hypotension: no Has patient had a PCN reaction causing severe rash involving mucus membranes or skin necrosis: No Has patient had a PCN reaction that required hospitalization No Has patient had a PCN reaction occurring within the last 10 years: No If all of the above answers are "NO", then may proceed with Cephalosporin use.   Family History  Problem Relation Age of Onset  . CVA Mother   . Hypertension Mother    . Diabetes Mellitus II Mother   . Congestive Heart Failure Mother   . CVA Father   . Hypertension Father   . Diabetes Mellitus II Father     Current Outpatient Medications (Endocrine & Metabolic):  .  levothyroxine (SYNTHROID, LEVOTHROID) 50 MCG tablet, TAKE 1 TABLET BY MOUTH EVERY DAY .  predniSONE (DELTASONE) 50 MG tablet, Take one tablet daily at breakfast    Current Outpatient Medications (Analgesics):  .  HYDROcodone-acetaminophen (NORCO/VICODIN) 5-325 MG tablet, Take 1-2 tablets by mouth every 6 (six) hours as needed. .  meloxicam (MOBIC) 7.5 MG tablet, Take 1 tablet (7.5 mg total) by mouth daily.   Current Outpatient Medications (Other):  Marland Kitchen  Ascorbic Acid (VITAMIN C) 500 MG CAPS, Take 500 mg by mouth daily. .  Diclofenac Sodium (PENNSAID) 2 % SOLN, Place 2 g onto the skin 2 (two) times daily. .  Diclofenac Sodium (PENNSAID) 2 % SOLN, Place 2 g onto the skin 2 (two) times daily. .  diclofenac sodium (VOLTAREN) 1 % GEL, Apply 2 g topically 4 (four) times daily. .  Diclofenac Sodium 2 % SOLN, Place 2 g onto the skin 2 (two) times daily. Marland Kitchen  gabapentin (NEURONTIN) 100 MG capsule, Take 2 capsules (200 mg total) by mouth at bedtime. .  hydrOXYzine (VISTARIL) 25 MG capsule, TAKE 1 CAPSULE BY MOUTH AT BEDTIME .  lidocaine (LIDODERM) 5 %, Place 1 patch onto the skin daily. Remove & Discard patch within 12 hours or as directed by MD .  methocarbamol (ROBAXIN) 500 MG tablet, Take 1 tablet (500 mg total) by mouth 2 (two) times daily. .  Misc Natural Products (TART CHERRY ADVANCED PO), Take 3 tablets by mouth daily. .  Multiple Vitamins-Minerals (MULTIVITAMIN ADULT PO), Take 1 tablet by mouth daily. .  ondansetron (ZOFRAN ODT) 4 MG disintegrating tablet, Take 1 tablet (4 mg total) by mouth every 8 (eight) hours as needed for nausea or vomiting. Marland Kitchen  tiZANidine (ZANAFLEX) 4 MG tablet, Take 1 tablet (4 mg total) by mouth every 6 (six) hours as needed for muscle spasms. .  traZODone (DESYREL)  50 MG tablet, TAKE 1/2 TO 1 TABLET BY MOUTH AT BEDTIME .  Turmeric 500 MG CAPS, Take 500 mg by mouth daily. Marland Kitchen  venlafaxine XR (EFFEXOR XR) 150 MG 24 hr capsule, Take 1 capsule (150 mg total) by mouth daily with breakfast. .  Vitamin D, Ergocalciferol, (DRISDOL) 50000 units CAPS capsule, TAKE 1 CAPSULE (50,000 UNITS TOTAL) BY MOUTH EVERY 7 (SEVEN) DAYS.   Reviewed prior external information including notes and imaging from  primary care provider As well as notes that were available from care everywhere and other healthcare systems.  Past medical history, social, surgical and family history all reviewed in electronic medical record.  No pertanent information unless  stated regarding to the chief complaint.   Review of Systems:  No headache, visual changes, nausea, vomiting, diarrhea, constipation, dizziness, abdominal pain, skin rash, fevers, chills, night sweats, weight loss, swollen lymph nodes, body aches, joint swelling, chest pain, shortness of breath, mood changes. POSITIVE muscle aches  Objective  currently breastfeeding.   General: No apparent distress alert and oriented x3 mood and affect normal, dressed appropriately.  HEENT: Pupils equal, extraocular movements intact  Respiratory: Patient's speak in full sentences and does not appear short of breath  Cardiovascular: No lower extremity edema, non tender, no erythema  Skin: Warm dry intact with no signs of infection or rash on extremities or on axial skeleton.  Abdomen: Soft nontender  Neuro: Cranial nerves II through XII are intact, neurovascularly intact in all extremities with 2+ DTRs and 2+ pulses.  Lymph: No lymphadenopathy of posterior or anterior cervical chain or axillae bilaterally.  Gait normal with good balance and coordination.  MSK:  Non tender with full range of motion and good stability and symmetric strength and tone of shoulders, elbows,  hip, knee and ankles bilaterally.   Right wrist exam shows the patient does  have a mild positive Lourena Simmonds still remaining.  Patient does have good grip strength.  Mild positive Tinel's sign on the right side.   Back Exam:  Inspection: Loss of lordosis Motion: Flexion 45 deg, Extension 25 deg with worsening pain with extension, Side Bending to 35 deg bilaterally,  Rotation to 35 deg bilaterally  SLR laying: Negative  XSLR laying: Negative  Palpable tenderness: Tender to palpation of paraspinal musculature lumbar spine left greater than right. FABER: Tightness bilaterally. Sensory change: Gross sensation intact to all lumbar and sacral dermatomes.  Reflexes: 2+ at both patellar tendons, 2+ at achilles tendons, Babinski's downgoing.  Strength at foot  Plantar-flexion: 5/5 Dorsi-flexion: 5/5 Eversion: 5/5 Inversion: 5/5  Leg strength  Quad: 5/5 Hamstring: 5/5 Hip flexor: 5/5 Hip abductors: 5/5   Osteopathic findings  T9 extended rotated and side bent left L4 flexed rotated and side bent right Sacrum left on left  Limited musculoskeletal ultrasound was performed and interpreted by [name]  Limited ultrasound of patient's right wrist shows that patient does have what appears to be a superficial phlebitis of more of the radial vein.  It is compressible but continues to have some mild soft tissue in the area.  No true cyst formation.  Patient's de Quervain's actually improved with less hypoechoic changes in the tendon sheath.    Impression and Recommendations:     This case required medical decision making of moderate complexity. The above documentation has been reviewed and is accurate and complete [name], DO       Note: This dictation was prepared with Dragon dictation along with smaller phrase technology. Any transcriptional errors that result from this process are unintentional.

## 2019-12-01 NOTE — Patient Instructions (Addendum)
Doxycycline 10 days 2 baby aspirin 1 month See me in 4-5 weeks

## 2019-12-01 NOTE — Assessment & Plan Note (Signed)
Decision today to treat with OMT was based on Physical Exam  After verbal consent patient was treated with HVLA, ME, FPR techniques in  thoracic, lumbar and sacral areas  Patient tolerated the procedure well with improvement in symptoms  Patient given exercises, stretches and lifestyle modifications  See medications in patient instructions if given  Patient will follow up in 4-8 weeks 

## 2019-12-02 ENCOUNTER — Encounter: Payer: Self-pay | Admitting: Family Medicine

## 2019-12-02 DIAGNOSIS — I809 Phlebitis and thrombophlebitis of unspecified site: Secondary | ICD-10-CM

## 2019-12-02 HISTORY — DX: Phlebitis and thrombophlebitis of unspecified site: I80.9

## 2019-12-02 NOTE — Assessment & Plan Note (Addendum)
Appears to be more of the upper extremity which is different.  Does not seem to be a true pseudoaneurysm.  Discussed with patient in great length about icing regimen, home exercise, which activities social determinants of health including patient having pain unintended weight in her home for her husband who recently had some surgery.  Follow-up with me again in 4 to 8 weeks medication management including doxycycline as well as aspirin therapy

## 2019-12-14 ENCOUNTER — Other Ambulatory Visit: Payer: Self-pay | Admitting: Family Medicine

## 2019-12-29 ENCOUNTER — Other Ambulatory Visit: Payer: Self-pay

## 2019-12-29 ENCOUNTER — Ambulatory Visit: Payer: 59 | Admitting: Family Medicine

## 2019-12-29 ENCOUNTER — Encounter: Payer: Self-pay | Admitting: Family Medicine

## 2019-12-29 VITALS — BP 108/74 | HR 69 | Ht 65.0 in | Wt 248.0 lb

## 2019-12-29 DIAGNOSIS — M533 Sacrococcygeal disorders, not elsewhere classified: Secondary | ICD-10-CM | POA: Diagnosis not present

## 2019-12-29 DIAGNOSIS — M2550 Pain in unspecified joint: Secondary | ICD-10-CM | POA: Diagnosis not present

## 2019-12-29 DIAGNOSIS — M999 Biomechanical lesion, unspecified: Secondary | ICD-10-CM | POA: Diagnosis not present

## 2019-12-29 DIAGNOSIS — I809 Phlebitis and thrombophlebitis of unspecified site: Secondary | ICD-10-CM | POA: Diagnosis not present

## 2019-12-29 LAB — VITAMIN D 25 HYDROXY (VIT D DEFICIENCY, FRACTURES): VITD: 13.36 ng/mL — ABNORMAL LOW (ref 30.00–100.00)

## 2019-12-29 LAB — COMPREHENSIVE METABOLIC PANEL
ALT: 16 U/L (ref 0–35)
AST: 20 U/L (ref 0–37)
Albumin: 3.9 g/dL (ref 3.5–5.2)
Alkaline Phosphatase: 68 U/L (ref 39–117)
BUN: 11 mg/dL (ref 6–23)
CO2: 25 mEq/L (ref 19–32)
Calcium: 8.8 mg/dL (ref 8.4–10.5)
Chloride: 106 mEq/L (ref 96–112)
Creatinine, Ser: 0.67 mg/dL (ref 0.40–1.20)
GFR: 100.16 mL/min (ref >60.00)
Glucose, Bld: 111 mg/dL — ABNORMAL HIGH (ref 70–99)
Potassium: 3.7 mEq/L (ref 3.5–5.1)
Sodium: 137 mEq/L (ref 135–145)
Total Bilirubin: 0.5 mg/dL (ref 0.2–1.2)
Total Protein: 7.1 g/dL (ref 6.0–8.3)

## 2019-12-29 LAB — CBC WITH DIFFERENTIAL/PLATELET
Basophils Absolute: 0 10*3/uL (ref 0.0–0.1)
Basophils Relative: 0.4 % (ref 0.0–3.0)
Eosinophils Absolute: 0.2 10*3/uL (ref 0.0–0.7)
Eosinophils Relative: 2.1 % (ref 0.0–5.0)
HCT: 36.6 % (ref 36.0–46.0)
Hemoglobin: 12.1 g/dL (ref 12.0–15.0)
Lymphocytes Relative: 25.8 % (ref 12.0–46.0)
Lymphs Abs: 1.9 10*3/uL (ref 0.7–4.0)
MCHC: 33.1 g/dL (ref 30.0–36.0)
MCV: 82.9 fl (ref 78.0–100.0)
Monocytes Absolute: 0.4 10*3/uL (ref 0.1–1.0)
Monocytes Relative: 5.9 % (ref 3.0–12.0)
Neutro Abs: 4.8 10*3/uL (ref 1.4–7.7)
Neutrophils Relative %: 65.8 % (ref 43.0–77.0)
Platelets: 256 10*3/uL (ref 150.0–400.0)
RBC: 4.41 Mil/uL (ref 3.87–5.11)
RDW: 14.7 % (ref 11.5–15.5)
WBC: 7.4 10*3/uL (ref 4.0–10.5)

## 2019-12-29 LAB — IBC PANEL
Iron: 62 ug/dL (ref 42–145)
Saturation Ratios: 15.9 % — ABNORMAL LOW (ref 20.0–50.0)
Transferrin: 279 mg/dL (ref 212.0–360.0)

## 2019-12-29 LAB — C-REACTIVE PROTEIN: CRP: 1 mg/dL (ref 0.5–20.0)

## 2019-12-29 LAB — SEDIMENTATION RATE: Sed Rate: 21 mm/hr — ABNORMAL HIGH (ref 0–20)

## 2019-12-29 LAB — FERRITIN: Ferritin: 12.1 ng/mL (ref 10.0–291.0)

## 2019-12-29 LAB — URIC ACID: Uric Acid, Serum: 5.2 mg/dL (ref 2.4–7.0)

## 2019-12-29 LAB — TSH: TSH: 2.02 u[IU]/mL (ref 0.35–4.50)

## 2019-12-29 NOTE — Assessment & Plan Note (Signed)
   Decision today to treat with OMT was based on Physical Exam  After verbal consent patient was treated with HVLA, ME, FPR techniques in , thoracic,  lumbar and sacral areas, all areas are chronic   Patient tolerated the procedure well with improvement in symptoms  Patient given exercises, stretches and lifestyle modifications  See medications in patient instructions if given  Patient will follow up in 4-8 weeks 

## 2019-12-29 NOTE — Assessment & Plan Note (Addendum)
Mild exacerbation of a chronic problem.  Acacian management discussed including gabapentin and topical anti-inflammatories.  Discussed icing regimen and home exercise, which activities to doing which wants to avoid, increase activity slowly.  Follow-up again in 6 to 8 weeks

## 2019-12-29 NOTE — Assessment & Plan Note (Signed)
Appears to completely resolved at this time.  Patient does have many different comorbidities and continues to have an complaints of multiple different things I do feel laboratory work-up is necessary including sedimentation rate.  Patient will follow up with me again in 4 to 6 weeks

## 2019-12-29 NOTE — Patient Instructions (Addendum)
Labs today See me in 6 weeks 

## 2019-12-29 NOTE — Progress Notes (Signed)
[facility] [place] Phone: [phone] Subjective:   Kristin [name], am serving as a scribe for Dr. Hulan Saas. This visit occurred during the SARS-CoV-2 public health emergency.  Safety protocols were in place, including screening questions prior to the visit, additional usage of staff PPE, and extensive cleaning of exam room while observing appropriate contact time as indicated for disinfecting solutions.    I'm seeing this patient by the request  of:  Patient, [name] Pcp Per  CC: Wrist pain, back pain follow-up  [id number]   12/01/2019 Appears to be more of the upper extremity which is different.  Does not seem to be a true pseudoaneurysm.  Discussed with patient in great length about icing regimen, home exercise, which activities social determinants of health including patient having pain unintended weight in her home for her husband who recently had some surgery.  Follow-up with me again in 4 to 8 weeks medication management including doxycycline as well as aspirin therapy  Update 12/29/2019 Kristin [name] is a 34 y.o. female coming in with complaint of left hand pain and back pain. Patient states that her back pain has increased as she stepped wrong.   Having discoloration more on pale side of right hand middle and ring finger. Fingers are tingling, making it hard to hold onto pencil or toothbrush. Used antibiotic and baby aspirin.      Past Medical History:  Diagnosis Date  . History of kidney stones   . Kidney stone   . Kidney stones    Kidney Stones  . Knee joint pain   . Pregnancy induced hypertension   . Syncope    Past Surgical History:  Procedure Laterality Date  . EXTRACORPOREAL SHOCK WAVE LITHOTRIPSY Left 01/30/2017   Procedure: LEFT EXTRACORPOREAL SHOCK WAVE LITHOTRIPSY (ESWL);  Surgeon: Cleon Gustin, MD;  Location: [facility];  Service: Urology;  Laterality: Left;  . KIDNEY STONE SURGERY      Social History   Socioeconomic History  . Marital status: Married    Spouse name: Not on file  . Number of children: Not on file  . Years of education: Not on file  . Highest education level: Not on file  Occupational History  . Not on file  Tobacco Use  . Smoking status: Never Smoker  . Smokeless tobacco: Never Used  Substance and Sexual Activity  . Alcohol use: [name]  . Drug use: [name]  . Sexual activity: Not on file  Other Topics Concern  . Not on file  Social History Narrative  . Not on file   Social Determinants of Health   Financial Resource Strain:   . Difficulty of Paying Living Expenses:   Food Insecurity:   . Worried About Charity fundraiser in the Last Year:   . Arboriculturist in the Last Year:   Transportation Needs:   . Film/video editor (Medical):   Marland Kitchen Lack of Transportation (Non-Medical):   Physical Activity:   . Days of Exercise per Week:   . Minutes of Exercise per Session:   Stress:   . Feeling of Stress :   Social Connections:   . Frequency of Communication with Friends and Family:   . Frequency of Social Gatherings with Friends and Family:   . Attends Religious Services:   . Active Member of Clubs or Organizations:   . Attends Archivist Meetings:   Marland Kitchen Marital Status:    Allergies  Allergen Reactions  . Amoxicillin Hives    Has patient had a PCN reaction causing immediate rash, facial/tongue/throat swelling, SOB or lightheadedness with hypotension: [name] Has patient had a PCN reaction causing severe rash involving mucus membranes or skin necrosis: [name] Has patient had a PCN reaction that required hospitalization [name] Has patient had a PCN reaction occurring within the last 10 years: [name] If all of the above answers are "[name]", then may proceed with Cephalosporin use.   Family History  Problem Relation Age of Onset  . CVA Mother   . Hypertension Mother   . Diabetes Mellitus II Mother   . Congestive Heart Failure Mother   . CVA Father    . Hypertension Father   . Diabetes Mellitus II Father     Current Outpatient Medications (Endocrine & Metabolic):  .  levothyroxine (SYNTHROID, LEVOTHROID) 50 MCG tablet, TAKE 1 TABLET BY MOUTH EVERY DAY .  predniSONE (DELTASONE) 50 MG tablet, Take one tablet daily at breakfast    Current Outpatient Medications (Analgesics):  .  HYDROcodone-acetaminophen (NORCO/VICODIN) 5-325 MG tablet, Take 1-2 tablets by mouth every 6 (six) hours as needed. .  meloxicam (MOBIC) 7.5 MG tablet, Take 1 tablet (7.5 mg total) by mouth daily.   Current Outpatient Medications (Other):  Marland Kitchen  Ascorbic Acid (VITAMIN C) 500 MG CAPS, Take 500 mg by mouth daily. .  Diclofenac Sodium (PENNSAID) 2 % SOLN, Place 2 g onto the skin 2 (two) times daily. .  Diclofenac Sodium (PENNSAID) 2 % SOLN, Place 2 g onto the skin 2 (two) times daily. .  diclofenac sodium (VOLTAREN) 1 % GEL, Apply 2 g topically 4 (four) times daily. .  Diclofenac Sodium 2 % SOLN, Place 2 g onto the skin 2 (two) times daily. Marland Kitchen  doxycycline (VIBRA-TABS) 100 MG tablet, Take 1 tablet (100 mg total) by mouth 2 (two) times daily. Marland Kitchen  gabapentin (NEURONTIN) 100 MG capsule, Take 2 capsules (200 mg total) by mouth at bedtime. .  hydrOXYzine (VISTARIL) 25 MG capsule, TAKE 1 CAPSULE BY MOUTH AT BEDTIME .  lidocaine (LIDODERM) 5 %, Place 1 patch onto the skin daily. Remove & Discard patch within 12 hours or as directed by MD .  methocarbamol (ROBAXIN) 500 MG tablet, Take 1 tablet (500 mg total) by mouth 2 (two) times daily. .  Misc Natural Products (TART CHERRY ADVANCED PO), Take 3 tablets by mouth daily. .  Multiple Vitamins-Minerals (MULTIVITAMIN ADULT PO), Take 1 tablet by mouth daily. .  ondansetron (ZOFRAN ODT) 4 MG disintegrating tablet, Take 1 tablet (4 mg total) by mouth every 8 (eight) hours as needed for nausea or vomiting. Marland Kitchen  tiZANidine (ZANAFLEX) 4 MG tablet, Take 1 tablet (4 mg total) by mouth every 6 (six) hours as needed for muscle spasms. .   traZODone (DESYREL) 50 MG tablet, TAKE 1/2 TO 1 TABLET BY MOUTH AT BEDTIME .  Turmeric 500 MG CAPS, Take 500 mg by mouth daily. Marland Kitchen  venlafaxine XR (EFFEXOR XR) 150 MG 24 hr capsule, Take 1 capsule (150 mg total) by mouth daily with breakfast. .  Vitamin D, Ergocalciferol, (DRISDOL) 50000 units CAPS capsule, TAKE 1 CAPSULE (50,000 UNITS TOTAL) BY MOUTH EVERY 7 (SEVEN) DAYS.   Reviewed prior external information including notes and imaging from  primary care provider As well as notes that were available from care everywhere and other healthcare systems.  Past medical history, social, surgical and family history all reviewed in electronic medical record.  [name] pertanent information unless stated regarding to  the chief complaint.   Review of Systems:  [name] headache, visual changes, nausea, vomiting, diarrhea, constipation, dizziness, abdominal pain, skin rash, fevers, chills, night sweats, weight loss, swollen lymph nodes, body aches, joint swelling, chest pain, shortness of breath, mood changes. POSITIVE muscle aches  Objective  Blood pressure 108/74, pulse 69, height 5\' 5"  (1.651 m), weight 248 lb (112.5 kg), SpO2 98 %, currently breastfeeding.   General: [name] apparent distress alert and oriented x3 mood and affect normal, dressed appropriately.  HEENT: Pupils equal, extraocular movements intact  Respiratory: Patient's speak in full sentences and does not appear short of breath  Cardiovascular: [name] lower extremity edema, non tender, [name] erythema  Neuro: Cranial nerves II through XII are intact, neurovascularly intact in all extremities with 2+ DTRs and 2+ pulses.  Gait normal with good balance and coordination.  MSK:  Non tender with full range of motion and good stability and symmetric strength and tone of shoulders, elbows,  hip, knee and ankles bilaterally.  Right wrist exam shows the patient does not have any significant abnormality noted at this moment.  Significant decrease in tenderness from  previous exam.  Thoracic back mostly at the thoracolumbar juncture seems to be doing relatively well.  Tender in the sacroiliac junction right greater than left.  Poor core strength noted.  Osteopathic findings  T3 extended rotated and side bent righ T8 extended rotated and side bent left L2 flexed rotated and side bent right Sacrum right on right  Impression and Recommendations:     This case required medical decision making of moderate complexity. The above documentation has been reviewed and is accurate and complete , DO       Note: This dictation was prepared with Dragon dictation along with smaller phrase technology. Any transcriptional errors that result from this process are unintentional.

## 2020-01-02 LAB — ANGIOTENSIN CONVERTING ENZYME: Angiotensin-Converting Enzyme: 55 U/L (ref 9–67)

## 2020-01-02 LAB — CYCLIC CITRUL PEPTIDE ANTIBODY, IGG: Cyclic Citrullin Peptide Ab: <16 UNITS

## 2020-01-02 LAB — RHEUMATOID FACTOR: Rheumatoid fact SerPl-aCnc: <14 IU/mL (ref <14)

## 2020-01-02 LAB — CALCIUM, IONIZED: Calcium, Ion: 4.9 mg/dL (ref 4.8–5.6)

## 2020-01-02 LAB — PTH, INTACT AND CALCIUM
Calcium: 8.9 mg/dL (ref 8.6–10.2)
PTH: 30 pg/mL (ref 14–64)

## 2020-01-02 LAB — ANA: Anti Nuclear Antibody (ANA): NEGATIVE

## 2020-01-04 ENCOUNTER — Other Ambulatory Visit: Payer: Self-pay

## 2020-01-04 ENCOUNTER — Encounter: Payer: Self-pay | Admitting: Family Medicine

## 2020-01-04 MED ORDER — VITAMIN D (ERGOCALCIFEROL) 1.25 MG (50000 UNIT) PO CAPS: 50000.0000 [IU] | ORAL_CAPSULE | ORAL | 0 refills | Status: DC

## 2020-01-14 ENCOUNTER — Encounter: Payer: Self-pay | Admitting: Family Medicine

## 2020-02-04 ENCOUNTER — Encounter: Payer: Self-pay | Admitting: Family Medicine

## 2020-02-04 ENCOUNTER — Ambulatory Visit: Payer: 59 | Admitting: Family Medicine

## 2020-02-04 ENCOUNTER — Other Ambulatory Visit: Payer: Self-pay

## 2020-02-04 VITALS — BP 122/86 | HR 76 | Ht 65.0 in | Wt 261.0 lb

## 2020-02-04 DIAGNOSIS — M999 Biomechanical lesion, unspecified: Secondary | ICD-10-CM

## 2020-02-04 DIAGNOSIS — M4306 Spondylolysis, lumbar region: Secondary | ICD-10-CM

## 2020-02-04 NOTE — Progress Notes (Signed)
[facility] [place] Phone: [phone] Subjective:   Fontaine No, am serving as a scribe for Dr. Hulan Saas. This visit occurred during the SARS-CoV-2 public health emergency.  Safety protocols were in place, including screening questions prior to the visit, additional usage of staff PPE, and extensive cleaning of exam room while observing appropriate contact time as indicated for disinfecting solutions.   I'm seeing this patient by the request  of:  Patient, No Pcp Per  CC: Low back pain  TDV:[id number]   Kristin [name] is a 34 y.o. female coming in with complaint of back pain. Last seen on 12/29/2019 for OMT. Patient states that she fell at work on Wednesday which increased back pain. Right sided with radiculopathy. Using IBU for pain relief.  Patient states that it seems to be somewhat the same when she has had which is worsening.  Patient has been taking some of the medications and especially the gabapentin on a more regular basis secondary to the discomfort and pain.  Also noticed ibuprofen is helpful.       Past Medical History:  Diagnosis Date  . History of kidney stones   . Kidney stone   . Kidney stones    Kidney Stones  . Knee joint pain   . Pregnancy induced hypertension   . Syncope    Past Surgical History:  Procedure Laterality Date  . EXTRACORPOREAL SHOCK WAVE LITHOTRIPSY Left 01/30/2017   Procedure: LEFT EXTRACORPOREAL SHOCK WAVE LITHOTRIPSY (ESWL);  Surgeon: Cleon Gustin, MD;  Location: [facility];  Service: Urology;  Laterality: Left;  . KIDNEY STONE SURGERY     Social History   Socioeconomic History  . Marital status: Married    Spouse name: Not on file  . Number of children: Not on file  . Years of education: Not on file  . Highest education level: Not on file  Occupational History  . Not on file  Tobacco Use  . Smoking status: Never Smoker  . Smokeless tobacco: Never Used    Substance and Sexual Activity  . Alcohol use: No  . Drug use: No  . Sexual activity: Not on file  Other Topics Concern  . Not on file  Social History Narrative  . Not on file   Social Determinants of Health   Financial Resource Strain:   . Difficulty of Paying Living Expenses:   Food Insecurity:   . Worried About Charity fundraiser in the Last Year:   . Arboriculturist in the Last Year:   Transportation Needs:   . Film/video editor (Medical):   Marland Kitchen Lack of Transportation (Non-Medical):   Physical Activity:   . Days of Exercise per Week:   . Minutes of Exercise per Session:   Stress:   . Feeling of Stress :   Social Connections:   . Frequency of Communication with Friends and Family:   . Frequency of Social Gatherings with Friends and Family:   . Attends Religious Services:   . Active Member of Clubs or Organizations:   . Attends Archivist Meetings:   Marland Kitchen Marital Status:    Allergies  Allergen Reactions  . Amoxicillin Hives    Has patient had a PCN reaction causing immediate rash, facial/tongue/throat swelling, SOB or lightheadedness with hypotension: no Has patient had a PCN reaction causing severe rash involving mucus membranes or skin necrosis: No Has patient had a PCN reaction that required  hospitalization No Has patient had a PCN reaction occurring within the last 10 years: No If all of the above answers are "NO", then may proceed with Cephalosporin use.   Family History  Problem Relation Age of Onset  . CVA Mother   . Hypertension Mother   . Diabetes Mellitus II Mother   . Congestive Heart Failure Mother   . CVA Father   . Hypertension Father   . Diabetes Mellitus II Father     Current Outpatient Medications (Endocrine & Metabolic):  .  levothyroxine (SYNTHROID, LEVOTHROID) 50 MCG tablet, TAKE 1 TABLET BY MOUTH EVERY DAY .  predniSONE (DELTASONE) 50 MG tablet, Take one tablet daily at breakfast    Current Outpatient Medications  (Analgesics):  .  HYDROcodone-acetaminophen (NORCO/VICODIN) 5-325 MG tablet, Take 1-2 tablets by mouth every 6 (six) hours as needed. .  meloxicam (MOBIC) 7.5 MG tablet, Take 1 tablet (7.5 mg total) by mouth daily.   Current Outpatient Medications (Other):  Marland Kitchen  Ascorbic Acid (VITAMIN C) 500 MG CAPS, Take 500 mg by mouth daily. .  Diclofenac Sodium (PENNSAID) 2 % SOLN, Place 2 g onto the skin 2 (two) times daily. .  Diclofenac Sodium (PENNSAID) 2 % SOLN, Place 2 g onto the skin 2 (two) times daily. .  diclofenac sodium (VOLTAREN) 1 % GEL, Apply 2 g topically 4 (four) times daily. .  Diclofenac Sodium 2 % SOLN, Place 2 g onto the skin 2 (two) times daily. Marland Kitchen  doxycycline (VIBRA-TABS) 100 MG tablet, Take 1 tablet (100 mg total) by mouth 2 (two) times daily. Marland Kitchen  gabapentin (NEURONTIN) 100 MG capsule, Take 2 capsules (200 mg total) by mouth at bedtime. .  hydrOXYzine (VISTARIL) 25 MG capsule, TAKE 1 CAPSULE BY MOUTH AT BEDTIME .  lidocaine (LIDODERM) 5 %, Place 1 patch onto the skin daily. Remove & Discard patch within 12 hours or as directed by MD .  methocarbamol (ROBAXIN) 500 MG tablet, Take 1 tablet (500 mg total) by mouth 2 (two) times daily. .  Misc Natural Products (TART CHERRY ADVANCED PO), Take 3 tablets by mouth daily. .  Multiple Vitamins-Minerals (MULTIVITAMIN ADULT PO), Take 1 tablet by mouth daily. .  ondansetron (ZOFRAN ODT) 4 MG disintegrating tablet, Take 1 tablet (4 mg total) by mouth every 8 (eight) hours as needed for nausea or vomiting. Marland Kitchen  tiZANidine (ZANAFLEX) 4 MG tablet, Take 1 tablet (4 mg total) by mouth every 6 (six) hours as needed for muscle spasms. .  traZODone (DESYREL) 50 MG tablet, TAKE 1/2 TO 1 TABLET BY MOUTH AT BEDTIME .  Turmeric 500 MG CAPS, Take 500 mg by mouth daily. Marland Kitchen  venlafaxine XR (EFFEXOR XR) 150 MG 24 hr capsule, Take 1 capsule (150 mg total) by mouth daily with breakfast. .  Vitamin D, Ergocalciferol, (DRISDOL) 1.25 MG (50000 UNIT) CAPS capsule, Take 1  capsule (50,000 Units total) by mouth every 7 (seven) days.   Reviewed prior external information including notes and imaging from  primary care provider As well as notes that were available from care everywhere and other healthcare systems.  Past medical history, social, surgical and family history all reviewed in electronic medical record.  No pertanent information unless stated regarding to the chief complaint.   Review of Systems:  No headache, visual changes, nausea, vomiting, diarrhea, constipation, dizziness, abdominal pain, skin rash, fevers, chills, night sweats, weight loss, swollen lymph nodes, body aches, joint swelling, chest pain, shortness of breath, mood changes. POSITIVE muscle aches  Objective  Blood pressure 122/86, pulse 76, height 5\' 5"  (1.651 m), weight 261 lb (118.4 kg), SpO2 97 %, currently breastfeeding.   General: No apparent distress alert and oriented x3 mood and affect normal, dressed appropriately.  HEENT: Pupils equal, extraocular movements intact  Respiratory: Patient's speak in full sentences and does not appear short of breath  Cardiovascular: No lower extremity edema, non tender, no erythema  Neuro: Cranial nerves II through XII are intact, neurovascularly intact in all extremities with 2+ DTRs and 2+ pulses.  Gait normal with good balance and coordination.  MSK:  Non tender with full range of motion and good stability and symmetric strength and tone of shoulders, elbows, wrist, hip, knee and ankles bilaterally.  Low back exam shows more tenderness over the L4-L5 and L5-S1 areas of the paraspinal musculature.  Worsening pain with extension of the back.  Negative straight leg test.  Tightness with FABER test noted.  Neurovascular intact distally.  No CVA tenderness noted today.  Osteopathic findings   T9 extended rotated and side bent left L2 flexed rotated and side bent right Sacrum right on right    Impression and Recommendations:     This case  required medical decision making of moderate complexity. The above documentation has been reviewed and is accurate and complete , DO       Note: This dictation was prepared with Dragon dictation along with smaller phrase technology. Any transcriptional errors that result from this process are unintentional.

## 2020-02-04 NOTE — Patient Instructions (Signed)
Congrats on school Wear brace at work for the next 2 weeks Arnica lotion to the wrist  Watch the back See me again in 4 weeks

## 2020-02-04 NOTE — Assessment & Plan Note (Signed)
   Decision today to treat with OMT was based on Physical Exam  After verbal consent patient was treated with HVLA, ME, FPR techniques in cervical, thoracic,  lumbar and sacral areas, all areas are chronic   Patient tolerated the procedure well with improvement in symptoms  Patient given exercises, stretches and lifestyle modifications  See medications in patient instructions if given  Patient will follow up in 4-8 weeks 

## 2020-02-04 NOTE — Assessment & Plan Note (Signed)
Exacerbation of the lower back after a fall.  Patient did need significant number of injections initially.  Patient would like to avoid any at the moment.  We discussed different medications including prednisone is a possibility.  Patient declined at the moment.  Discussed icing regimen and home exercises, discussed which activities to doing which wants to avoid.  Increase activity slowly.  Follow-up with me again in 4 to 8 weeks

## 2020-03-06 ENCOUNTER — Ambulatory Visit (INDEPENDENT_AMBULATORY_CARE_PROVIDER_SITE_OTHER): Payer: 59 | Admitting: Family Medicine

## 2020-03-06 ENCOUNTER — Encounter: Payer: Self-pay | Admitting: Family Medicine

## 2020-03-06 ENCOUNTER — Other Ambulatory Visit: Payer: Self-pay

## 2020-03-06 ENCOUNTER — Ambulatory Visit: Payer: Self-pay

## 2020-03-06 VITALS — BP 130/80 | HR 71 | Ht 65.0 in | Wt 251.0 lb

## 2020-03-06 DIAGNOSIS — M4306 Spondylolysis, lumbar region: Secondary | ICD-10-CM

## 2020-03-06 DIAGNOSIS — M67431 Ganglion, right wrist: Secondary | ICD-10-CM

## 2020-03-06 DIAGNOSIS — M25531 Pain in right wrist: Secondary | ICD-10-CM | POA: Diagnosis not present

## 2020-03-06 DIAGNOSIS — M999 Biomechanical lesion, unspecified: Secondary | ICD-10-CM | POA: Diagnosis not present

## 2020-03-06 NOTE — Progress Notes (Signed)
[name] [address] [place] [address] Phone: [phone] Subjective:   I Kristin [name] am serving as a Neurosurgeon for Dr. Antoine Primas.  This visit occurred during the SARS-CoV-2 public health emergency.  Safety protocols were in place, including screening questions prior to the visit, additional usage of staff PPE, and extensive cleaning of exam room while observing appropriate contact time as indicated for disinfecting solutions.   I'm seeing this patient by the request  of:  Patient, No Pcp Per  CC: Wrist pain and back pain follow-up  [id number]  Kristin [name] is a 34 y.o. female coming in with complaint of back and wrist pain. Last seen 02/04/2020 for OMT. Wrist has a cyst now. Back is doing ok.  Patient is seeing me multiple times for the back.  Does have some mild radicular symptoms.  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  Patient has avoided epidurals for some time now.  Right wrist seems to be getting worse.  Starting to affect daily activities.  Brace is not helping.  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 History of kidney stones   . Kidney stone   . Kidney stones    Kidney Stones  . Knee joint pain   . Pregnancy induced hypertension   . Syncope    Past Surgical History:  Procedure Laterality Date  . EXTRACORPOREAL SHOCK WAVE LITHOTRIPSY Left 01/30/2017   Procedure: LEFT EXTRACORPOREAL SHOCK WAVE LITHOTRIPSY (ESWL);  Surgeon: Malen Gauze, MD;  Location: [facility];  Service: Urology;  Laterality: Left;  . KIDNEY STONE SURGERY     Social History   Socioeconomic History  . Marital status: Married    Spouse name: Not on file  . Number of children: Not on file  . Years of education: Not on file  . Highest education level: Not on file  Occupational History  . Not on file  Tobacco Use  .  Smoking status: Never Smoker  . Smokeless tobacco: Never Used  Substance and Sexual Activity  . Alcohol use: No  . Drug use: No  . Sexual activity: Not on file  Other Topics Concern  . Not on file  Social History Narrative  . Not on file   Social Determinants of Health   Financial Resource Strain:   . Difficulty of Paying Living Expenses:   Food Insecurity:   . Worried About Programme researcher, broadcasting/film/video in the Last Year:   . Barista in the Last Year:   Transportation Needs:   . Freight forwarder (Medical):   Marland Kitchen Lack of Transportation (Non-Medical):   Physical Activity:   . Days of Exercise per Week:   . Minutes of Exercise per Session:   Stress:   . Feeling of Stress :   Social Connections:   . Frequency of Communication with Friends and Family:   . Frequency of Social Gatherings with Friends and Family:   . Attends Religious Services:   . Active Member of Clubs or Organizations:   . Attends Banker Meetings:   Marland Kitchen Marital Status:    Allergies  Allergen Reactions  . Amoxicillin Hives    Has patient had a PCN reaction causing immediate rash, facial/tongue/throat swelling, SOB or lightheadedness with hypotension: no Has patient had a PCN reaction causing severe  rash involving mucus membranes or skin necrosis: No Has patient had a PCN reaction that required hospitalization No Has patient had a PCN reaction occurring within the last 10 years: No If all of the above answers are "NO", then may proceed with Cephalosporin use.   Family History  Problem Relation Age of Onset  . CVA Mother   . Hypertension Mother   . Diabetes Mellitus II Mother   . Congestive Heart Failure Mother   . CVA Father   . Hypertension Father   . Diabetes Mellitus II Father     Current Outpatient Medications (Endocrine & Metabolic):  .  levothyroxine (SYNTHROID, LEVOTHROID) 50 MCG tablet, TAKE 1 TABLET BY MOUTH EVERY DAY .  predniSONE (DELTASONE) 50 MG tablet, Take one tablet daily  at breakfast    Current Outpatient Medications (Analgesics):  .  HYDROcodone-acetaminophen (NORCO/VICODIN) 5-325 MG tablet, Take 1-2 tablets by mouth every 6 (six) hours as needed. .  meloxicam (MOBIC) 7.5 MG tablet, Take 1 tablet (7.5 mg total) by mouth daily.   Current Outpatient Medications (Other):  Marland Kitchen  Ascorbic Acid (VITAMIN C) 500 MG CAPS, Take 500 mg by mouth daily. .  Diclofenac Sodium (PENNSAID) 2 % SOLN, Place 2 g onto the skin 2 (two) times daily. .  Diclofenac Sodium (PENNSAID) 2 % SOLN, Place 2 g onto the skin 2 (two) times daily. .  diclofenac sodium (VOLTAREN) 1 % GEL, Apply 2 g topically 4 (four) times daily. .  Diclofenac Sodium 2 % SOLN, Place 2 g onto the skin 2 (two) times daily. Marland Kitchen  doxycycline (VIBRA-TABS) 100 MG tablet, Take 1 tablet (100 mg total) by mouth 2 (two) times daily. Marland Kitchen  gabapentin (NEURONTIN) 100 MG capsule, Take 2 capsules (200 mg total) by mouth at bedtime. .  hydrOXYzine (VISTARIL) 25 MG capsule, TAKE 1 CAPSULE BY MOUTH AT BEDTIME .  lidocaine (LIDODERM) 5 %, Place 1 patch onto the skin daily. Remove & Discard patch within 12 hours or as directed by MD .  methocarbamol (ROBAXIN) 500 MG tablet, Take 1 tablet (500 mg total) by mouth 2 (two) times daily. .  Misc Natural Products (TART CHERRY ADVANCED PO), Take 3 tablets by mouth daily. .  Multiple Vitamins-Minerals (MULTIVITAMIN ADULT PO), Take 1 tablet by mouth daily. .  ondansetron (ZOFRAN ODT) 4 MG disintegrating tablet, Take 1 tablet (4 mg total) by mouth every 8 (eight) hours as needed for nausea or vomiting. Marland Kitchen  tiZANidine (ZANAFLEX) 4 MG tablet, Take 1 tablet (4 mg total) by mouth every 6 (six) hours as needed for muscle spasms. .  traZODone (DESYREL) 50 MG tablet, TAKE 1/2 TO 1 TABLET BY MOUTH AT BEDTIME .  Turmeric 500 MG CAPS, Take 500 mg by mouth daily. Marland Kitchen  venlafaxine XR (EFFEXOR XR) 150 MG 24 hr capsule, Take 1 capsule (150 mg total) by mouth daily with breakfast. .  Vitamin D, Ergocalciferol,  (DRISDOL) 1.25 MG (50000 UNIT) CAPS capsule, Take 1 capsule (50,000 Units total) by mouth every 7 (seven) days.   Reviewed prior external information including notes and imaging from  primary care provider As well as notes that were available from care everywhere and other healthcare systems.  Past medical history, social, surgical and family history all reviewed in electronic medical record.  No pertanent information unless stated regarding to the chief complaint.   Review of Systems:  No headache, visual changes, nausea, vomiting, diarrhea, constipation, dizziness, abdominal pain, skin rash, fevers, chills, night sweats, weight loss, swollen lymph nodes, body  aches, joint swelling, chest pain, shortness of breath, mood changes. POSITIVE muscle aches  Objective  Blood pressure 130/80, pulse 71, height 5\' 5"  (1.651 m), weight 251 lb (113.9 kg), SpO2 98 %, currently breastfeeding.   General: No apparent distress alert and oriented x3 mood and affect normal, dressed appropriately.  HEENT: Pupils equal, extraocular movements intact  Respiratory: Patient's speak in full sentences and does not appear short of breath  Cardiovascular: No lower extremity edema, non tender, no erythema  Neuro: Cranial nerves II through XII are intact, neurovascularly intact in all extremities with 2+ DTRs and 2+ pulses.  Gait normal with good balance and coordination.  MSK: Right wrist exam shows that over the radial artery there is a cyst noted.  Tender to palpation in the area.  Negative Finkelstein's.  5 out of 5 grip strength noted. Low back exam does have some tenderness to palpation diffusely in the thoracolumbar juncture as well as the lumbosacral area but seems to be right greater than left.  Tightness with .  Negative straight leg test.  Osteopathic findings C2 flexed rotated and side bent right C6 flexed rotated and side bent left T3 extended rotated and side bent right inhaled third rib T8  extended rotated and side bent left L2 flexed rotated and side bent right Sacrum right on right  Procedure: Real-time Ultrasound Guided Injection of right palmar cyst wrist Device: GE Logiq Q7 Ultrasound guided injection is preferred based studies that show increased duration, increased effect, greater accuracy, decreased procedural pain, increased response rate, and decreased cost with ultrasound guided versus blind injection.  Verbal informed consent obtained.  Time-out conducted.  Noted no overlying erythema, induration, or other signs of local infection.  Skin prepped in a sterile fashion.  Local anesthesia: Topical Ethyl chloride.  With sterile technique and under real time ultrasound guidance: With a 25-gauge half inch needle injected with 0.5 cc of 0.5% Marcaine and 0.5 cc of Kenalog 40 mg/mL Completed without difficulty  Pain immediately resolved suggesting accurate placement of the medication.  Advised to call if fevers/chills, erythema, induration, drainage, or persistent bleeding.  Images permanently stored and available for review in the ultrasound unit.  Impression: Technically successful ultrasound guided injection.    Impression and Recommendations:     The above documentation has been reviewed and is accurate and complete Pearlean Brownie, DO       Note: This dictation was prepared with Dragon dictation along with smaller phrase technology. Any transcriptional errors that result from this process are unintentional.

## 2020-03-06 NOTE — Patient Instructions (Signed)
See me again in 4-6 weeks Watch it should be better in 2 weeks

## 2020-03-07 ENCOUNTER — Encounter: Payer: Self-pay | Admitting: Family Medicine

## 2020-03-07 DIAGNOSIS — M67431 Ganglion, right wrist: Secondary | ICD-10-CM

## 2020-03-07 HISTORY — DX: Ganglion, right wrist: M67.431

## 2020-03-07 NOTE — Assessment & Plan Note (Signed)
Injection given today, tolerated the procedure well, discussed icing regimen and home exercise, increase activity slowly.  Follow-up again in 4 to 8 weeks

## 2020-03-07 NOTE — Assessment & Plan Note (Signed)

## 2020-03-07 NOTE — Assessment & Plan Note (Signed)
Chronic problem with exacerbation.  Discussed Zanaflex, Effexor, responds well to manipulation, discussed vitamin D.  Icing regimen, core strengthening and weight loss.  Follow-up again in 4 to 8 weeks

## 2020-03-22 ENCOUNTER — Other Ambulatory Visit: Payer: Self-pay | Admitting: Family Medicine

## 2020-04-14 ENCOUNTER — Ambulatory Visit: Payer: Self-pay

## 2020-04-14 ENCOUNTER — Other Ambulatory Visit: Payer: Self-pay

## 2020-04-14 ENCOUNTER — Ambulatory Visit: Payer: 59 | Admitting: Family Medicine

## 2020-04-14 ENCOUNTER — Encounter: Payer: Self-pay | Admitting: Family Medicine

## 2020-04-14 VITALS — BP 150/90 | HR 66 | Ht 65.0 in | Wt 249.0 lb

## 2020-04-14 DIAGNOSIS — M4306 Spondylolysis, lumbar region: Secondary | ICD-10-CM

## 2020-04-14 DIAGNOSIS — M67431 Ganglion, right wrist: Secondary | ICD-10-CM

## 2020-04-14 DIAGNOSIS — M999 Biomechanical lesion, unspecified: Secondary | ICD-10-CM | POA: Diagnosis not present

## 2020-04-14 DIAGNOSIS — M25531 Pain in right wrist: Secondary | ICD-10-CM | POA: Diagnosis not present

## 2020-04-14 NOTE — Assessment & Plan Note (Signed)

## 2020-04-14 NOTE — Progress Notes (Signed)
Kristin [name].  This visit occurred during the SARS-CoV-2 public health emergency.  Safety protocols were in place, including screening questions prior to the visit, additional usage of staff PPE, and extensive cleaning of exam room while observing appropriate contact time as indicated for disinfecting solutions.   I'm seeing this patient by the request  of:  Patient, No Pcp Per  CC: Wrist pain, back pain follow-up  [id number]   03/06/2020 Injection given today, tolerated the procedure well, discussed icing regimen and home exercise, increase activity slowly.  Follow-up again in 4 to 8 weeks  04/14/2020 Kristin [name] is a 34 y.o. female coming in with complaint of right wrist pain. Patient states she still loses feeling. Believes the wrist might need drained. States the last injection she noticed increased pain.  Patient is also having some mild back pain.  Has known facet arthritic changes as well as a pars defect.  Has responded well though recently to more the conservative aspect with the manipulation.  Not having as many of the injections.  Trying to avoid those.  Continuing with the Effexor that does seem to be helpful.      Past Medical History:  Diagnosis Date  . History of kidney stones   . Kidney stone   . Kidney stones    Kidney Stones  . Knee joint pain   . Pregnancy induced hypertension   . Syncope    Past Surgical History:  Procedure Laterality Date  . EXTRACORPOREAL SHOCK WAVE LITHOTRIPSY Left 01/30/2017   Procedure: LEFT EXTRACORPOREAL SHOCK WAVE LITHOTRIPSY (ESWL);  Surgeon: Malen Gauze, MD;  Location: [facility];  Service: Urology;  Laterality: Left;  . KIDNEY STONE SURGERY     Social History   Socioeconomic History  . Marital status: Married    Spouse name: Not on file  . Number of  children: Not on file  . Years of education: Not on file  . Highest education level: Not on file  Occupational History  . Not on file  Tobacco Use  . Smoking status: Never Smoker  . Smokeless tobacco: Never Used  Substance and Sexual Activity  . Alcohol use: No  . Drug use: No  . Sexual activity: Not on file  Other Topics Concern  . Not on file  Social History Narrative  . Not on file   Social Determinants of Health   Financial Resource Strain:   . Difficulty of Paying Living Expenses:   Food Insecurity:   . Worried About Programme researcher, broadcasting/film/video in the Last Year:   . Barista in the Last Year:   Transportation Needs:   . Freight forwarder (Medical):   Marland Kitchen Lack of Transportation (Non-Medical):   Physical Activity:   . Days of Exercise per Week:   . Minutes of Exercise per Session:   Stress:   . Feeling of Stress :   Social Connections:   . Frequency of Communication with Friends and Family:   . Frequency of Social Gatherings with Friends and Family:   . Attends Religious Services:   . Active Member of Clubs or Organizations:   . Attends Banker Meetings:   Marland Kitchen Marital Status:    Allergies  Allergen Reactions  . Amoxicillin Hives    Has patient had a PCN reaction causing immediate rash,  facial/tongue/throat swelling, SOB or lightheadedness with hypotension: no Has patient had a PCN reaction causing severe rash involving mucus membranes or skin necrosis: No Has patient had a PCN reaction that required hospitalization No Has patient had a PCN reaction occurring within the last 10 years: No If all of the above answers are "NO", then may proceed with Cephalosporin use.   Family History  Problem Relation Age of Onset  . CVA Mother   . Hypertension Mother   . Diabetes Mellitus II Mother   . Congestive Heart Failure Mother   . CVA Father   . Hypertension Father   . Diabetes Mellitus II Father     Current Outpatient Medications (Endocrine &  Metabolic):  .  levothyroxine (SYNTHROID, LEVOTHROID) 50 MCG tablet, TAKE 1 TABLET BY MOUTH EVERY DAY .  predniSONE (DELTASONE) 50 MG tablet, Take one tablet daily at breakfast    Current Outpatient Medications (Analgesics):  .  HYDROcodone-acetaminophen (NORCO/VICODIN) 5-325 MG tablet, Take 1-2 tablets by mouth every 6 (six) hours as needed. .  meloxicam (MOBIC) 7.5 MG tablet, Take 1 tablet (7.5 mg total) by mouth daily.   Current Outpatient Medications (Other):  Marland Kitchen  Ascorbic Acid (VITAMIN C) 500 MG CAPS, Take 500 mg by mouth daily. .  Diclofenac Sodium (PENNSAID) 2 % SOLN, Place 2 g onto the skin 2 (two) times daily. .  Diclofenac Sodium (PENNSAID) 2 % SOLN, Place 2 g onto the skin 2 (two) times daily. .  diclofenac sodium (VOLTAREN) 1 % GEL, Apply 2 g topically 4 (four) times daily. .  Diclofenac Sodium 2 % SOLN, Place 2 g onto the skin 2 (two) times daily. Marland Kitchen  doxycycline (VIBRA-TABS) 100 MG tablet, Take 1 tablet (100 mg total) by mouth 2 (two) times daily. Marland Kitchen  gabapentin (NEURONTIN) 100 MG capsule, Take 2 capsules (200 mg total) by mouth at bedtime. .  hydrOXYzine (VISTARIL) 25 MG capsule, TAKE 1 CAPSULE BY MOUTH AT BEDTIME .  lidocaine (LIDODERM) 5 %, Place 1 patch onto the skin daily. Remove & Discard patch within 12 hours or as directed by MD .  methocarbamol (ROBAXIN) 500 MG tablet, Take 1 tablet (500 mg total) by mouth 2 (two) times daily. .  Misc Natural Products (TART CHERRY ADVANCED PO), Take 3 tablets by mouth daily. .  Multiple Vitamins-Minerals (MULTIVITAMIN ADULT PO), Take 1 tablet by mouth daily. .  ondansetron (ZOFRAN ODT) 4 MG disintegrating tablet, Take 1 tablet (4 mg total) by mouth every 8 (eight) hours as needed for nausea or vomiting. Marland Kitchen  tiZANidine (ZANAFLEX) 4 MG tablet, Take 1 tablet (4 mg total) by mouth every 6 (six) hours as needed for muscle spasms. .  traZODone (DESYREL) 50 MG tablet, TAKE 1/2 TO 1 TABLET BY MOUTH AT BEDTIME .  Turmeric 500 MG CAPS, Take 500 mg  by mouth daily. Marland Kitchen  venlafaxine XR (EFFEXOR XR) 150 MG 24 hr capsule, Take 1 capsule (150 mg total) by mouth daily with breakfast. .  Vitamin D, Ergocalciferol, (DRISDOL) 1.25 MG (50000 UNIT) CAPS capsule, TAKE 1 CAPSULE (50,000 UNITS TOTAL) BY MOUTH EVERY 7 (SEVEN) DAYS.   Reviewed prior external information including notes and imaging from  primary care provider As well as notes that were available from care everywhere and other healthcare systems.  Past medical history, social, surgical and family history all reviewed in electronic medical record.  No pertanent information unless stated regarding to the chief complaint.   Review of Systems:  No headache, visual changes, nausea, vomiting, diarrhea,  constipation, dizziness, abdominal pain, skin rash, fevers, chills, night sweats, weight loss, swollen lymph nodes, body aches, joint swelling, chest pain, shortness of breath, mood changes. POSITIVE muscle aches  Objective  Blood pressure (!) 150/90, pulse 66, height 5\' 5"  (1.651 m), weight (!) 249 lb (112.9 kg), SpO2 97 %, currently breastfeeding.   General: No apparent distress alert and oriented x3 mood and affect normal, dressed appropriately.  HEENT: Pupils equal, extraocular movements intact  Respiratory: Patient's speak in full sentences and does not appear short of breath  Cardiovascular: No lower extremity edema, non tender, no erythema  Neuro: Cranial nerves II through XII are intact, neurovascularly intact in all extremities with 2+ DTRs and 2+ pulses.  Gait normal with good balance and coordination.  MSK:  Right wrist does show the patient does have a palmar aspect cyst noted that seems to be ganglion in nature.  Tender to palpation in the surrounding area.  More on the radial side.  Low back exam still has some loss of lordosis.  Poor core strength.  Tightness with Faber's test.  Worsening pain with extension of the back.  Osteopathic findings   T6 extended rotated and side  bent left L5 flexed rotated and side bent left Sacrum right on right  Procedure: Real-time Ultrasound Guided Injection of right ganglion cyst of wrist Device: GE Logiq E  Ultrasound guided injection is preferred based studies that show increased duration, increased effect, greater accuracy, decreased procedural pain, increased response rate with ultrasound guided versus blind injection.  Verbal informed consent obtained.  Time-out conducted.  Noted no overlying erythema, induration, or other signs of local infection.  Skin prepped in a sterile fashion.  Local anesthesia: Topical Ethyl chloride.  With sterile technique and under real time ultrasound guidance:  tendon visualized.  23g 5/8 inch needle inserted distal to proximal approach into ganglion cyst sheath. Pictures taken  for needle placement. Patient did have injection of 0.5 cc of 0.5% Marcaine, aspirated some gel-like material and then injected 0.5 cc of Kenalog 40 mg/dL. Completed without difficulty  Pain immediately resolved suggesting accurate placement of the medication.  Advised to call if fevers/chills, erythema, induration, drainage, or persistent bleeding.  Images permanently stored and available for review in the ultrasound unit.  Impression: Technically successful ultrasound guided injection.   Impression and Recommendations:     The above documentation has been reviewed and is accurate and complete 7/8, DO       Note: This dictation was prepared with Dragon dictation along with smaller phrase technology. Any transcriptional errors that result from this process are unintentional.

## 2020-04-14 NOTE — Assessment & Plan Note (Signed)
Chronic problem with exacerbation.  Discussed with activities to doing which wants to avoid.  Increasing activity slowly.  Patient has responded well to manipulation.  Discussed medications including the Effexor follow-up again 6 weeks.

## 2020-04-14 NOTE — Assessment & Plan Note (Signed)
I do believe the patient should do well with this aspiration.  I do believe the patient should have improvement noted.  Discussed if he continues to come back possible surgical intervention will be necessary.  Increase activity slowly.  Follow-up again 6 weeks

## 2020-04-14 NOTE — Patient Instructions (Signed)
Keep up the exercises Good luck with work See me again in 6 weeks

## 2020-04-17 ENCOUNTER — Encounter: Payer: Self-pay | Admitting: Family Medicine

## 2020-05-24 NOTE — Progress Notes (Signed)
[name] [address] [place] [address] Phone: [phone] Subjective:   Kristin [name], am serving as a scribe for Dr. Antoine Primas. This visit occurred during the SARS-CoV-2 public health emergency.  Safety protocols were in place, including screening questions prior to the visit, additional usage of staff PPE, and extensive cleaning of exam room while observing appropriate contact time as indicated for disinfecting solutions.   I'm seeing this patient by the request  of:  Patient, No Pcp Per  CC: Right wrist pain follow-up, back pain follow-up  [id number]   04/14/2020 Chronic problem with exacerbation.  Discussed with activities to doing which wants to avoid.  Increasing activity slowly.  Patient has responded well to manipulation.  Discussed medications including the Effexor follow-up again 6 weeks.  I do believe the patient should do well with this aspiration.  I do believe the patient should have improvement noted.  Discussed if he continues to come back possible surgical intervention will be necessary.  Increase activity slowly.  Follow-up again 6 weeks   Update 05/25/2020 Kristin [name] is a 34 y.o. female coming in with complaint of low back and right wrist pain. OMT performed at last visit. Has not has any issues since last visit.  Doing relatively well.  No significant radiation of pain.  Still very achy throughout the day but nothing that stops her from activity.  States that swelling came back in right wrist. Is having numbness in 3rd and 4th fingers right hand. Numbness was gone after draining cyst.  Patient states that unfortunately continues to have the pain.  Noticing the swelling seems to be coming back as well.      Past Medical History:  Diagnosis Date  . History of kidney stones   . Kidney stone   . Kidney stones    Kidney Stones  . Knee joint pain   . Pregnancy induced hypertension   . Syncope    Past Surgical  History:  Procedure Laterality Date  . EXTRACORPOREAL SHOCK WAVE LITHOTRIPSY Left 01/30/2017   Procedure: LEFT EXTRACORPOREAL SHOCK WAVE LITHOTRIPSY (ESWL);  Surgeon: Malen Gauze, MD;  Location: [facility];  Service: Urology;  Laterality: Left;  . KIDNEY STONE SURGERY     Social History   Socioeconomic History  . Marital status: Married    Spouse name: Not on file  . Number of children: Not on file  . Years of education: Not on file  . Highest education level: Not on file  Occupational History  . Not on file  Tobacco Use  . Smoking status: Never Smoker  . Smokeless tobacco: Never Used  Substance and Sexual Activity  . Alcohol use: No  . Drug use: No  . Sexual activity: Not on file  Other Topics Concern  . Not on file  Social History Narrative  . Not on file   Social Determinants of Health   Financial Resource Strain:   . Difficulty of Paying Living Expenses: Not on file  Food Insecurity:   . Worried About Programme researcher, broadcasting/film/video in the Last Year: Not on file  . Ran Out of Food in the Last Year: Not on file  Transportation Needs:   . Lack of Transportation (Medical): Not on file  . Lack of Transportation (Non-Medical): Not on file  Physical Activity:   . Days of Exercise per Week: Not on file  . Minutes of Exercise per Session: Not on file  Stress:   .  Feeling of Stress : Not on file  Social Connections:   . Frequency of Communication with Friends and Family: Not on file  . Frequency of Social Gatherings with Friends and Family: Not on file  . Attends Religious Services: Not on file  . Active Member of Clubs or Organizations: Not on file  . Attends Banker Meetings: Not on file  . Marital Status: Not on file   Allergies  Allergen Reactions  . Amoxicillin Hives    Has patient had a PCN reaction causing immediate rash, facial/tongue/throat swelling, SOB or lightheadedness with hypotension: no Has patient had a PCN reaction causing severe rash involving  mucus membranes or skin necrosis: No Has patient had a PCN reaction that required hospitalization No Has patient had a PCN reaction occurring within the last 10 years: No If all of the above answers are "NO", then may proceed with Cephalosporin use.   Family History  Problem Relation Age of Onset  . CVA Mother   . Hypertension Mother   . Diabetes Mellitus II Mother   . Congestive Heart Failure Mother   . CVA Father   . Hypertension Father   . Diabetes Mellitus II Father     Current Outpatient Medications (Endocrine & Metabolic):  .  levothyroxine (SYNTHROID, LEVOTHROID) 50 MCG tablet, TAKE 1 TABLET BY MOUTH EVERY DAY .  predniSONE (DELTASONE) 50 MG tablet, Take one tablet daily at breakfast    Current Outpatient Medications (Analgesics):  .  HYDROcodone-acetaminophen (NORCO/VICODIN) 5-325 MG tablet, Take 1-2 tablets by mouth every 6 (six) hours as needed. .  meloxicam (MOBIC) 7.5 MG tablet, Take 1 tablet (7.5 mg total) by mouth daily.   Current Outpatient Medications (Other):  Marland Kitchen  Ascorbic Acid (VITAMIN C) 500 MG CAPS, Take 500 mg by mouth daily. .  Diclofenac Sodium (PENNSAID) 2 % SOLN, Place 2 g onto the skin 2 (two) times daily. .  Diclofenac Sodium (PENNSAID) 2 % SOLN, Place 2 g onto the skin 2 (two) times daily. .  diclofenac sodium (VOLTAREN) 1 % GEL, Apply 2 g topically 4 (four) times daily. .  Diclofenac Sodium 2 % SOLN, Place 2 g onto the skin 2 (two) times daily. Marland Kitchen  doxycycline (VIBRA-TABS) 100 MG tablet, Take 1 tablet (100 mg total) by mouth 2 (two) times daily. Marland Kitchen  gabapentin (NEURONTIN) 100 MG capsule, Take 2 capsules (200 mg total) by mouth at bedtime. .  hydrOXYzine (VISTARIL) 25 MG capsule, TAKE 1 CAPSULE BY MOUTH AT BEDTIME .  lidocaine (LIDODERM) 5 %, Place 1 patch onto the skin daily. Remove & Discard patch within 12 hours or as directed by MD .  methocarbamol (ROBAXIN) 500 MG tablet, Take 1 tablet (500 mg total) by mouth 2 (two) times daily. .  Misc Natural  Products (TART CHERRY ADVANCED PO), Take 3 tablets by mouth daily. .  Multiple Vitamins-Minerals (MULTIVITAMIN ADULT PO), Take 1 tablet by mouth daily. .  ondansetron (ZOFRAN ODT) 4 MG disintegrating tablet, Take 1 tablet (4 mg total) by mouth every 8 (eight) hours as needed for nausea or vomiting. Marland Kitchen  tiZANidine (ZANAFLEX) 4 MG tablet, Take 1 tablet (4 mg total) by mouth every 6 (six) hours as needed for muscle spasms. .  traZODone (DESYREL) 50 MG tablet, TAKE 1/2 TO 1 TABLET BY MOUTH AT BEDTIME .  Turmeric 500 MG CAPS, Take 500 mg by mouth daily. Marland Kitchen  venlafaxine XR (EFFEXOR XR) 150 MG 24 hr capsule, Take 1 capsule (150 mg total) by mouth daily  with breakfast. .  Vitamin D, Ergocalciferol, (DRISDOL) 1.25 MG (50000 UNIT) CAPS capsule, TAKE 1 CAPSULE (50,000 UNITS TOTAL) BY MOUTH EVERY 7 (SEVEN) DAYS.   Reviewed prior external information including notes and imaging from  primary care provider As well as notes that were available from care everywhere and other healthcare systems.  Past medical history, social, surgical and family history all reviewed in electronic medical record.  No pertanent information unless stated regarding to the chief complaint.   Review of Systems:  No headache, visual changes, nausea, vomiting, diarrhea, constipation, dizziness, abdominal pain, skin rash, fevers, chills, night sweats, weight loss, swollen lymph nodes, body aches, joint swelling, chest pain, shortness of breath, mood changes. POSITIVE muscle aches  Objective  Blood pressure 132/90, pulse 62, height 5\' 5"  (1.651 m), weight 244 lb (110.7 kg), SpO2 99 %,   General: No apparent distress alert and oriented x3 mood and affect normal, dressed appropriately.  Overweight  HEENT: Pupils equal, extraocular movements intact  Respiratory: Patient's speak in full sentences and does not appear short of breath  Cardiovascular: No lower extremity edema, non tender, no erythema  Neuro: Cranial nerves II through XII are  intact, neurovascularly intact in all extremities with 2+ DTRs and 2+ pulses.  Gait normal with good balance and coordination.  MSK: Right wrist exam shows the patient does have on the palmar aspect near the radial sinus cyst formation starting again.  Tenderness to area.  Very mild positive Tinel signs as well.  No thenar eminence wasting.  Back exam shows some loss of lordosis, poor core strength, tightness noted in the paraspinal musculature of the lumbar spine right greater than left.  Positive .  Worsening pain with extension.   Limited musculoskeletal ultrasound was performed and interpreted by Pearlean Brownie  Limited ultrasound of patient's right wrist shows that patient's ganglion cyst does seem to be superficial as well as deep to the flexor tendon sheath.  Does seem to be a relatively the same size.  Questionable to more soft tissue swelling noted likely secondary to rupture from the previous aspiration.  Osteopathic findings  T8 extended rotated and side bent left L2 flexed rotated and side bent right Sacrum right on right    Impression and Recommendations:     The above documentation has been reviewed and is accurate and complete [name], DO       Note: This dictation was prepared with Dragon dictation along with smaller phrase technology. Any transcriptional errors that result from this process are unintentional.

## 2020-05-25 ENCOUNTER — Ambulatory Visit: Payer: Self-pay

## 2020-05-25 ENCOUNTER — Ambulatory Visit: Payer: 59 | Admitting: Family Medicine

## 2020-05-25 ENCOUNTER — Other Ambulatory Visit: Payer: Self-pay

## 2020-05-25 VITALS — BP 132/90 | HR 62 | Ht 65.0 in | Wt 244.0 lb

## 2020-05-25 DIAGNOSIS — M25531 Pain in right wrist: Secondary | ICD-10-CM

## 2020-05-25 DIAGNOSIS — M4306 Spondylolysis, lumbar region: Secondary | ICD-10-CM | POA: Diagnosis not present

## 2020-05-25 DIAGNOSIS — M67431 Ganglion, right wrist: Secondary | ICD-10-CM

## 2020-05-25 DIAGNOSIS — M999 Biomechanical lesion, unspecified: Secondary | ICD-10-CM | POA: Diagnosis not present

## 2020-05-25 NOTE — Patient Instructions (Addendum)
Nerve conduction study MRI right wrist  No heels in the wedding  See me for your back in 6 weeks

## 2020-05-26 ENCOUNTER — Encounter: Payer: Self-pay | Admitting: Family Medicine

## 2020-05-26 NOTE — Assessment & Plan Note (Signed)
   Decision today to treat with OMT was based on Physical Exam  After verbal consent patient was treated with HVLA, ME, FPR techniques in , thoracic, , lumbar and sacral areas, all areas are chronic   Patient tolerated the procedure well with improvement in symptoms  Patient given exercises, stretches and lifestyle modifications  See medications in patient instructions if given  Patient will follow up in 4-8 weeks 

## 2020-05-26 NOTE — Assessment & Plan Note (Signed)
Cyst is actually on the palmar aspect of the wrist.  Discussed with patient.  Did not seem to respond as well to the aspiration this time.  Does have some mild swelling in the area.  I do feel that further evaluation with MRI at this time to see how large this is really is.  On ultrasound it does appear that it does possibly wraparound may be the abductor pollicis longus or flexor tendon sheath of some sort.  Patient does have some radicular symptoms as well and do not know if it is secondary to more of compression from the cyst or carpal tunnel with some mild enlargement noted on ultrasound as well.  I would like patient to have a nerve conduction study and MRI of the wrist to further evaluate and see if surgical intervention is necessary at this time

## 2020-05-26 NOTE — Assessment & Plan Note (Signed)
Stable.  No significant changes at this time.  Has different medications including gabapentin if needed.  Follow-up again in 8 weeks

## 2020-06-01 ENCOUNTER — Emergency Department (HOSPITAL_BASED_OUTPATIENT_CLINIC_OR_DEPARTMENT_OTHER)
Admission: EM | Admit: 2020-06-01 | Discharge: 2020-06-02 | Disposition: A | Discharge status: Home / Self Care | Payer: 59 | Attending: Emergency Medicine | Admitting: Emergency Medicine

## 2020-06-01 ENCOUNTER — Encounter (HOSPITAL_BASED_OUTPATIENT_CLINIC_OR_DEPARTMENT_OTHER): Payer: Self-pay | Admitting: Emergency Medicine

## 2020-06-01 ENCOUNTER — Other Ambulatory Visit: Payer: Self-pay

## 2020-06-01 DIAGNOSIS — S0990XA Unspecified injury of head, initial encounter: Secondary | ICD-10-CM | POA: Diagnosis not present

## 2020-06-01 DIAGNOSIS — S20212A Contusion of left front wall of thorax, initial encounter: Secondary | ICD-10-CM | POA: Diagnosis not present

## 2020-06-01 DIAGNOSIS — Y939 Activity, unspecified: Secondary | ICD-10-CM | POA: Diagnosis not present

## 2020-06-01 DIAGNOSIS — Y929 Unspecified place or not applicable: Secondary | ICD-10-CM | POA: Diagnosis not present

## 2020-06-01 DIAGNOSIS — W010XXA Fall on same level from slipping, tripping and stumbling without subsequent striking against object, initial encounter: Secondary | ICD-10-CM | POA: Insufficient documentation

## 2020-06-01 DIAGNOSIS — Z79899 Other long term (current) drug therapy: Secondary | ICD-10-CM | POA: Insufficient documentation

## 2020-06-01 DIAGNOSIS — Y999 Unspecified external cause status: Secondary | ICD-10-CM | POA: Insufficient documentation

## 2020-06-01 HISTORY — DX: Spondylolysis, lumbar region: M43.06

## 2020-06-01 NOTE — ED Triage Notes (Signed)
Pt reports slipping on dryer sheet and hitting her left wrist and head yesterday. Reports she's been having headaches since received COVID vaccine which has been "a while" - unsure exactly when, states this headache is no different. Reports she has been repeating things more frequently. States she didn't sleep well last night.

## 2020-06-02 ENCOUNTER — Encounter (HOSPITAL_BASED_OUTPATIENT_CLINIC_OR_DEPARTMENT_OTHER): Payer: Self-pay | Admitting: Emergency Medicine

## 2020-06-02 NOTE — ED Provider Notes (Signed)
[facility] Provider Note: Kristin Dell, MD, FACEP  CSN: [id number] MRN: [id number] ARRIVAL: 06/01/20 at 2144 ROOM: MHFT2/MHFT2   CHIEF COMPLAINT  Fall   HISTORY OF PRESENT ILLNESS  06/02/20 12:03 AM Kristin [name] is a 34 y.o. female whose husband states she slipped on a dryer sheet the evening before yesterday.  The patient does not remember falling but subsequently was complaining of mild pain in her left wrist (which has now resolved) and her left lateral rib area along with a mild headache.  She has had no vomiting, seizure activity or somnolence.  Her husband thinks she has been more confused and forgetful today than usual.  She rates the pain in her ribs as a 2 out of 10, worse with palpation or movement.  She has been having headaches since her he received her Covid vaccine "a while ago" and her current headache is no different.   Past Medical History:  Diagnosis Date  . Kidney stones   . Knee joint pain   . Pars defect of lumbar spine    L5  . Pregnancy induced hypertension   . Syncope     Past Surgical History:  Procedure Laterality Date  . EXTRACORPOREAL SHOCK WAVE LITHOTRIPSY Left 01/30/2017   Procedure: LEFT EXTRACORPOREAL SHOCK WAVE LITHOTRIPSY (ESWL);  Surgeon: Malen Gauze, MD;  Location: [facility];  Service: Urology;  Laterality: Left;  . KIDNEY STONE SURGERY      Family History  Problem Relation Age of Onset  . CVA Mother   . Hypertension Mother   . Diabetes Mellitus II Mother   . Congestive Heart Failure Mother   . CVA Father   . Hypertension Father   . Diabetes Mellitus II Father     Social History   Tobacco Use  . Smoking status: Never Smoker  . Smokeless tobacco: Never Used  Substance Use Topics  . Alcohol use: No  . Drug use: No    Prior to Admission medications   Medication Sig Start Date End Date Taking? Authorizing Provider  Ascorbic Acid (VITAMIN C) 500 MG CAPS Take 500 mg by mouth daily.    [provider]  Diclofenac Sodium (PENNSAID) 2 % SOLN Place 2 g onto the skin 2 (two) times daily. 07/03/18   [name], DO  Diclofenac Sodium (PENNSAID) 2 % SOLN Place 2 g onto the skin 2 (two) times daily. 10/21/18   [name], DO  diclofenac sodium (VOLTAREN) 1 % GEL Apply 2 g topically 4 (four) times daily. 12/31/16   Gwyneth Sprout, MD  Diclofenac Sodium 2 % SOLN Place 2 g onto the skin 2 (two) times daily. 08/17/19   [name], DO  doxycycline (VIBRA-TABS) 100 MG tablet Take 1 tablet (100 mg total) by mouth 2 (two) times daily. 12/01/19   [name], DO  gabapentin (NEURONTIN) 100 MG capsule Take 2 capsules (200 mg total) by mouth at bedtime. 08/18/17   [name], DO  HYDROcodone-acetaminophen (NORCO/VICODIN) 5-325 MG tablet Take 1-2 tablets by mouth every 6 (six) hours as needed. 01/11/18   Roxy Horseman, PA-C  hydrOXYzine (VISTARIL) 25 MG capsule TAKE 1 CAPSULE BY MOUTH AT BEDTIME 06/28/19   [name], DO  levothyroxine (SYNTHROID, LEVOTHROID) 50 MCG tablet TAKE 1 TABLET BY MOUTH EVERY DAY 09/09/18   [name], DO  lidocaine (LIDODERM) 5 % Place 1 patch onto the skin daily. Remove & Discard patch within 12 hours or as directed  by MD 04/18/18   Janne Napoleon, NP  meloxicam (MOBIC) 7.5 MG tablet Take 1 tablet (7.5 mg total) by mouth daily. 10/18/19   [name], DO  methocarbamol (ROBAXIN) 500 MG tablet Take 1 tablet (500 mg total) by mouth 2 (two) times daily. 04/18/18   Janne Napoleon, NP  Misc Natural Products (TART CHERRY ADVANCED PO) Take 3 tablets by mouth daily.    [provider]  Multiple Vitamins-Minerals (MULTIVITAMIN ADULT PO) Take 1 tablet by mouth daily.    [provider]  ondansetron (ZOFRAN ODT) 4 MG disintegrating tablet Take 1 tablet (4 mg total) by mouth every 8 (eight) hours as needed for nausea or vomiting. 01/15/17   Alvira Monday, MD  predniSONE (DELTASONE) 50 MG tablet Take one tablet daily at breakfast 04/23/18    [name], DO  tiZANidine (ZANAFLEX) 4 MG tablet Take 1 tablet (4 mg total) by mouth every 6 (six) hours as needed for muscle spasms. 02/03/19   [name], DO  traZODone (DESYREL) 50 MG tablet TAKE 1/2 TO 1 TABLET BY MOUTH AT BEDTIME 07/06/19   [name], DO  Turmeric 500 MG CAPS Take 500 mg by mouth daily.    [provider]  venlafaxine XR (EFFEXOR XR) 150 MG 24 hr capsule Take 1 capsule (150 mg total) by mouth daily with breakfast. 01/08/18   [name], DO  Vitamin D, Ergocalciferol, (DRISDOL) 1.25 MG (50000 UNIT) CAPS capsule TAKE 1 CAPSULE (50,000 UNITS TOTAL) BY MOUTH EVERY 7 (SEVEN) DAYS. 03/22/20   [name], DO    Allergies Amoxicillin   REVIEW OF SYSTEMS  Negative except as noted here or in the History of Present Illness.   PHYSICAL EXAMINATION  Initial Vital Signs Blood pressure (!) 140/97, pulse 66, temperature 98.6 F (37 C), temperature source Oral, resp. rate 14, weight 110.5 kg, SpO2 100 %, currently breastfeeding.  Examination General: Well-developed, well-nourished female in no acute distress; appearance consistent with age of record HENT: normocephalic; no palpable scalp hematoma; left TM normal, right TM obscured by cerumen Eyes: pupils equal, round and reactive to light; extraocular muscles intact Neck: supple; no C-spine tenderness Heart: regular rate and rhythm Lungs: clear to auscultation bilaterally Abdomen: soft; nondistended; nontender; bowel sounds present Chest: Mild left lateral rib tenderness Extremities: No deformity; full range of motion; pulses normal; no left wrist tenderness, ecchymosis or swelling Neurologic: Awake, alert and oriented x 4; motor function intact in all extremities and symmetric; no facial droop Skin: Warm and dry Psychiatric: Normal mood and affect   RESULTS  Summary of this visit's results, reviewed and interpreted by myself:   EKG Interpretation  Date/Time:    Ventricular Rate:      PR Interval:    QRS Duration:   QT Interval:    QTC Calculation:   R Axis:     Text Interpretation:        Laboratory Studies: No results found for this or any previous visit (from the past 24 hour(s)). Imaging Studies: No results found.  ED COURSE and MDM  Nursing notes, initial and subsequent vitals signs, including pulse oximetry, reviewed and interpreted by myself.  Vitals:   06/01/20 2154 06/01/20 2156  BP:  (!) 140/97  Pulse:  66  Resp:  14  Temp:  98.6 F (37 C)  TempSrc:  Oral  SpO2:  100%  Weight: 110.5 kg    Medications - No data to display  If the patient did  suffer a head injury there is no significant external evidence of trauma.  Her mental status is normal and she is fully oriented.  She may have a mild concussion causing some confusion and clotting of memory but I do not believe a head CT is indicated at this time.  She and her husband were advised of symptoms that would warrant return.  PROCEDURES  Procedures   ED DIAGNOSES     ICD-10-CM   1. Fall from slip, trip, or stumble, initial encounter  W01.0XXA   2. Minor head injury, initial encounter  S09.90XA   3. Rib contusion, left, initial encounter  S20.212A        Paula Libra, MD 06/02/20 [phone]

## 2020-06-08 ENCOUNTER — Encounter: Payer: Self-pay | Admitting: Neurology

## 2020-06-12 ENCOUNTER — Inpatient Hospital Stay (HOSPITAL_COMMUNITY): Payer: 59

## 2020-06-12 ENCOUNTER — Observation Stay (HOSPITAL_COMMUNITY)
Admission: AD | Admit: 2020-06-12 | Discharge: 2020-06-13 | Disposition: A | Discharge status: Home / Self Care | Payer: 59 | Attending: Internal Medicine | Admitting: Internal Medicine

## 2020-06-12 ENCOUNTER — Encounter (HOSPITAL_COMMUNITY): Payer: Self-pay | Admitting: Obstetrics and Gynecology

## 2020-06-12 ENCOUNTER — Other Ambulatory Visit: Payer: Self-pay

## 2020-06-12 DIAGNOSIS — R9089 Other abnormal findings on diagnostic imaging of central nervous system: Secondary | ICD-10-CM

## 2020-06-12 DIAGNOSIS — Z79899 Other long term (current) drug therapy: Secondary | ICD-10-CM | POA: Diagnosis not present

## 2020-06-12 DIAGNOSIS — R2990 Unspecified symptoms and signs involving the nervous system: Secondary | ICD-10-CM

## 2020-06-12 DIAGNOSIS — R4182 Altered mental status, unspecified: Secondary | ICD-10-CM | POA: Diagnosis present

## 2020-06-12 DIAGNOSIS — R569 Unspecified convulsions: Secondary | ICD-10-CM

## 2020-06-12 DIAGNOSIS — R9402 Abnormal brain scan: Secondary | ICD-10-CM | POA: Diagnosis not present

## 2020-06-12 DIAGNOSIS — R4701 Aphasia: Secondary | ICD-10-CM | POA: Diagnosis not present

## 2020-06-12 DIAGNOSIS — E876 Hypokalemia: Secondary | ICD-10-CM | POA: Diagnosis not present

## 2020-06-12 DIAGNOSIS — Z20822 Contact with and (suspected) exposure to covid-19: Secondary | ICD-10-CM | POA: Diagnosis not present

## 2020-06-12 DIAGNOSIS — R030 Elevated blood-pressure reading, without diagnosis of hypertension: Secondary | ICD-10-CM | POA: Insufficient documentation

## 2020-06-12 DIAGNOSIS — R1030 Lower abdominal pain, unspecified: Secondary | ICD-10-CM | POA: Diagnosis present

## 2020-06-12 HISTORY — DX: Unspecified symptoms and signs involving the nervous system: R29.90

## 2020-06-12 LAB — VITAMIN B12: Vitamin B-12: 425 pg/mL (ref 180–914)

## 2020-06-12 LAB — RAPID URINE DRUG SCREEN, HOSP PERFORMED
Amphetamines: NOT DETECTED
Barbiturates: NOT DETECTED
Benzodiazepines: NOT DETECTED
Cocaine: NOT DETECTED
Opiates: NOT DETECTED
Tetrahydrocannabinol: NOT DETECTED

## 2020-06-12 LAB — LIPASE, BLOOD: Lipase: 25 U/L (ref 11–51)

## 2020-06-12 LAB — COMPREHENSIVE METABOLIC PANEL
ALT: 19 U/L (ref 0–44)
AST: 19 U/L (ref 15–41)
Albumin: 3.9 g/dL (ref 3.5–5.0)
Alkaline Phosphatase: 63 U/L (ref 38–126)
Anion gap: 10 (ref 5–15)
BUN: 8 mg/dL (ref 6–20)
CO2: 22 mmol/L (ref 22–32)
Calcium: 9.5 mg/dL (ref 8.9–10.3)
Chloride: 106 mmol/L (ref 98–111)
Creatinine, Ser: 0.82 mg/dL (ref 0.44–1.00)
GFR calc Af Amer: >60 mL/min (ref >60)
GFR calc non Af Amer: >60 mL/min (ref >60)
Glucose, Bld: 123 mg/dL — ABNORMAL HIGH (ref 70–99)
Potassium: 3.4 mmol/L — ABNORMAL LOW (ref 3.5–5.1)
Sodium: 138 mmol/L (ref 135–145)
Total Bilirubin: 1 mg/dL (ref 0.3–1.2)
Total Protein: 7.8 g/dL (ref 6.5–8.1)

## 2020-06-12 LAB — CBC
HCT: 41 % (ref 36.0–46.0)
Hemoglobin: 12.9 g/dL (ref 12.0–15.0)
MCH: 26.9 pg (ref 26.0–34.0)
MCHC: 31.5 g/dL (ref 30.0–36.0)
MCV: 85.4 fL (ref 80.0–100.0)
Platelets: 296 10*3/uL (ref 150–400)
RBC: 4.8 MIL/uL (ref 3.87–5.11)
RDW: 13.5 % (ref 11.5–15.5)
WBC: 10.7 10*3/uL — ABNORMAL HIGH (ref 4.0–10.5)
nRBC: 0 % (ref 0.0–0.2)

## 2020-06-12 LAB — FOLATE: Folate: 20.3 ng/mL (ref >5.9)

## 2020-06-12 LAB — URINALYSIS, ROUTINE W REFLEX MICROSCOPIC
Bilirubin Urine: NEGATIVE
Glucose, UA: NEGATIVE mg/dL
Ketones, ur: 20 mg/dL — AB
Nitrite: NEGATIVE
Protein, ur: 30 mg/dL — AB
RBC / HPF: >50 RBC/hpf — ABNORMAL HIGH (ref 0–5)
Specific Gravity, Urine: 1.015 (ref 1.005–1.030)
WBC, UA: >50 WBC/hpf — ABNORMAL HIGH (ref 0–5)
pH: 5 (ref 5.0–8.0)

## 2020-06-12 LAB — TSH: TSH: 0.896 u[IU]/mL (ref 0.350–4.500)

## 2020-06-12 LAB — PROCALCITONIN: Procalcitonin: <0.1 ng/mL

## 2020-06-12 LAB — PROTIME-INR
INR: 1.1 (ref 0.8–1.2)
Prothrombin Time: 14 seconds (ref 11.4–15.2)

## 2020-06-12 LAB — LACTIC ACID, PLASMA: Lactic Acid, Venous: 0.9 mmol/L (ref 0.5–1.9)

## 2020-06-12 LAB — SARS CORONAVIRUS 2 BY RT PCR (HOSPITAL ORDER, PERFORMED IN ~~LOC~~ HOSPITAL LAB): SARS Coronavirus 2: NEGATIVE

## 2020-06-12 LAB — HCG, QUANTITATIVE, PREGNANCY: hCG, Beta Chain, Quant, S: <1 m[IU]/mL (ref <5)

## 2020-06-12 LAB — APTT: aPTT: 39 seconds — ABNORMAL HIGH (ref 24–36)

## 2020-06-12 LAB — HIV ANTIBODY (ROUTINE TESTING W REFLEX): HIV Screen 4th Generation wRfx: NONREACTIVE

## 2020-06-12 LAB — MAGNESIUM: Magnesium: 1.8 mg/dL (ref 1.7–2.4)

## 2020-06-12 MED ORDER — GADOBUTROL 1 MMOL/ML IV SOLN
10.0000 mL | Freq: Once | INTRAVENOUS | Status: AC | PRN
  Administered 2020-06-12: 10 mL via INTRAVENOUS

## 2020-06-12 MED ORDER — MORPHINE SULFATE (PF) 4 MG/ML IV SOLN: 2.0000 mg | INTRAVENOUS | Status: DC | PRN

## 2020-06-12 MED ORDER — ACETAMINOPHEN 650 MG RE SUPP: 650.0000 mg | RECTAL | Status: DC | PRN

## 2020-06-12 MED ORDER — ENOXAPARIN SODIUM 40 MG/0.4ML ~~LOC~~ SOLN
40.0000 mg | SUBCUTANEOUS | Status: DC
  Administered 2020-06-12: 40 mg via SUBCUTANEOUS
  Filled 2020-06-12 (×2): qty 0.4

## 2020-06-12 MED ORDER — LORAZEPAM 2 MG/ML IJ SOLN: 1.0000 mg | Freq: Once | INTRAMUSCULAR | Status: DC

## 2020-06-12 MED ORDER — LORAZEPAM 2 MG/ML IJ SOLN: 1.0000 mg | Freq: Once | INTRAMUSCULAR | Status: DC | PRN

## 2020-06-12 MED ORDER — POTASSIUM CHLORIDE CRYS ER 20 MEQ PO TBCR
40.0000 meq | EXTENDED_RELEASE_TABLET | Freq: Once | ORAL | Status: AC
  Administered 2020-06-12: 40 meq via ORAL
  Filled 2020-06-12 (×2): qty 2

## 2020-06-12 MED ORDER — LORAZEPAM 2 MG/ML IJ SOLN
INTRAMUSCULAR | Status: AC
  Filled 2020-06-12: qty 1

## 2020-06-12 MED ORDER — ADULT MULTIVITAMIN W/MINERALS CH
1.0000 | ORAL_TABLET | Freq: Every day | ORAL | Status: DC
  Administered 2020-06-12 – 2020-06-13 (×2): 1 via ORAL
  Filled 2020-06-12 (×2): qty 1

## 2020-06-12 MED ORDER — ACETAMINOPHEN 325 MG PO TABS
650.0000 mg | ORAL_TABLET | ORAL | Status: DC | PRN
  Administered 2020-06-12: 650 mg via ORAL
  Filled 2020-06-12: qty 2

## 2020-06-12 MED ORDER — ACETAMINOPHEN 160 MG/5ML PO SOLN
650.0000 mg | ORAL | Status: DC | PRN
  Filled 2020-06-12: qty 20.3

## 2020-06-12 MED ORDER — HYDRALAZINE HCL 20 MG/ML IJ SOLN: 10.0000 mg | Freq: Four times a day (QID) | INTRAMUSCULAR | Status: DC | PRN

## 2020-06-12 MED ORDER — STROKE: EARLY STAGES OF RECOVERY BOOK
Freq: Once | Status: DC
  Filled 2020-06-12: qty 1

## 2020-06-12 NOTE — Progress Notes (Signed)
Pt has arrived to the unit via hospital bed. All belongings sent with the patient. All IV are intact and tele being connected. Pt reports no pain. And has all equipment present too. Call bell and telephone are connected and within reach.    06/12/20 1754  Vitals  Temp 98.3 F (36.8 C)  Temp Source Oral  BP (!) 143/80  MAP (mmHg) 98  BP Location Right Arm  BP Method Automatic  Patient Position (if appropriate) Lying  Pulse Rate 83  Pulse Rate Source Dinamap  Resp 18  Level of Consciousness  Level of Consciousness Alert  MEWS COLOR  MEWS Score Color Green  Oxygen Therapy  SpO2 100 %  O2 Device Room Air  Pain Assessment  Pain Scale 0-10  Pain Score 0  MEWS Score  MEWS Temp 0  MEWS Systolic 0  MEWS Pulse 0  MEWS RR 0  MEWS LOC 0  MEWS Score 0

## 2020-06-12 NOTE — MAU Note (Signed)
EEG tech called RN into room to ask if we had noticed patient's intermittent  L arm twitching. This is a new symptom as far as MAU providers/nurses were aware. MAU provider notified.

## 2020-06-12 NOTE — MAU Note (Addendum)
~  2013 called to lobby, pt had fainted.  Pt non-responsive to name or sternal rub.  Pt's employer present, had brought her in for ? Miscarriage.  Pt taken to rm via wc.  Started responding to alcohol under nose.   Pt unable to speak, would shake or nod her head to questions(writing with right hand, holding paper with left).  (question seizure hx, or hx of fainting).  Pt unsure where she was this a.m. or who brought her in.  Attending called. 1018- went out again,less than a minute.    Pt able to write, "2 +HPT" this week.  Mom has had 2 strokes. Pt using sign language.

## 2020-06-12 NOTE — Progress Notes (Signed)
Code stroke activated via [facility].

## 2020-06-12 NOTE — Progress Notes (Signed)
EEG complete - results pending 

## 2020-06-12 NOTE — Consult Note (Addendum)
NEUROLOGY CONSULTATION NOTE   Date of service: June 12, 2020 Patient Name: Kristin [name] MRN:  [id number] DOB:  1986-05-02 Reason for consult: "stroke code"  History of Present Illness  Kristin [name] is a 34 y.o. female with PMH significant for kidney stones, syncope  who we saw as a stroke code for acute onset left sided weakness and unable to speak.   Patient was initially brought in by her boss for acute onset abdominal pain and cramps concerning for miscarriage. Patient self reported 2 positive at home pregnancy tests and not sure of her last menstrual period. In the ED, patient had an episode of unresponsiveness with eyes rolling back in her head. She had a second episode of unresponsiveness in the ED lasting less than a minute. Immediately after the episode, she was noted to be limp on the left side. She was awake, alert after the episode but unable to speak with intact comprehension, nodding to questions and able to write (?aphemia). LKW was established to be 0957 on 05/2020. She had earlier walked in to the ED and was able to converse with the front desk staff. A stroke code was called for STAT evaluation.  On my initial eval, she was limp in left arm and left leg with no facial droop. She appeared very distraught. She nods yes and no to questions, no weakness or incoordination in RUE and RLE.  We opted for a STAT MRI Brain instead of a CTH given concern that she is so early in her pregnancy, which was negative for a stroke but did demonstrate T2/FLAIR hyperintensities which could be potentially concerning for MS. Vital with SBP of 231 at presentation, no leukocytosis on the CBC, glucose of 123,  satting 100%, CMP with hypokalemia but no significant electrolyte abnormality.  On chart review, it seems that back in 2016, patient had episodes of unresponsiveness, about 10 episodes clustered close together, once of which was in the ED. There was no clonic activity and no postictal state  associated with these episodes. She was seen in the ED, admitted and had a 24 hours EEG which did not capture any epileptogenic discharges, no events captured.  NIHSS: 15, repeat NIHSS of 10 Premorbid mRS: 0 TPA: MRI Brain was negative for stroke, not given Thrombectomy: Not eligible, no stroke on MRI.  ROS   Constitutional Denies weight loss, fever and chills.  HEENT Denies changes in vision and hearing.  Respiratory Denies SOB and cough.  CV Denies palpitations and CP  GI Denies abdominal pain, nausea, vomiting and diarrhea.  GU Denies dysuria and urinary frequency.  MSK Denies myalgia and joint pain.  Skin Denies rash and pruritus.  Neurological Denies headache and syncope.  Psychiatric Denies recent changes in mood. Denies anxiety and depression.   Past History   Past Medical History:  Diagnosis Date  . Kidney stones   . Knee joint pain   . Pars defect of lumbar spine    L5  . Pregnancy induced hypertension   . Syncope    Past Surgical History:  Procedure Laterality Date  . EXTRACORPOREAL SHOCK WAVE LITHOTRIPSY Left 01/30/2017   Procedure: LEFT EXTRACORPOREAL SHOCK WAVE LITHOTRIPSY (ESWL);  Surgeon: Malen Gauze, MD;  Location: [facility];  Service: Urology;  Laterality: Left;  . KIDNEY STONE SURGERY     Family History  Problem Relation Age of Onset  . CVA Mother   . Hypertension Mother   . Diabetes Mellitus II Mother   . Congestive Heart Failure  Mother   . Stroke Mother   . CVA Father   . Hypertension Father   . Diabetes Mellitus II Father   . Heart disease Father   . Stroke Father   . Stroke Sister    Social History   Socioeconomic History  . Marital status: Married    Spouse name: Not on file  . Number of children: Not on file  . Years of education: Not on file  . Highest education level: Not on file  Occupational History  . Not on file  Tobacco Use  . Smoking status: Never Smoker  . Smokeless tobacco: Never Used  Substance and Sexual Activity   . Alcohol use: No  . Drug use: No  . Sexual activity: Not on file  Other Topics Concern  . Not on file  Social History Narrative  . Not on file   Social Determinants of Health   Financial Resource Strain:   . Difficulty of Paying Living Expenses: Not on file  Food Insecurity:   . Worried About Programme researcher, broadcasting/film/video in the Last Year: Not on file  . Ran Out of Food in the Last Year: Not on file  Transportation Needs:   . Lack of Transportation (Medical): Not on file  . Lack of Transportation (Non-Medical): Not on file  Physical Activity:   . Days of Exercise per Week: Not on file  . Minutes of Exercise per Session: Not on file  Stress:   . Feeling of Stress : Not on file  Social Connections:   . Frequency of Communication with Friends and Family: Not on file  . Frequency of Social Gatherings with Friends and Family: Not on file  . Attends Religious Services: Not on file  . Active Member of Clubs or Organizations: Not on file  . Attends Banker Meetings: Not on file  . Marital Status: Not on file   Allergies  Allergen Reactions  . Amoxicillin Hives    Has patient had a PCN reaction causing immediate rash, facial/tongue/throat swelling, SOB or lightheadedness with hypotension: no Has patient had a PCN reaction causing severe rash involving mucus membranes or skin necrosis: No Has patient had a PCN reaction that required hospitalization No Has patient had a PCN reaction occurring within the last 10 years: No If all of the above answers are "NO", then may proceed with Cephalosporin use.    Medications   Medications Prior to Admission  Medication Sig Dispense Refill Last Dose  . Ascorbic Acid (VITAMIN C) 500 MG CAPS Take 500 mg by mouth daily.     . Diclofenac Sodium (PENNSAID) 2 % SOLN Place 2 g onto the skin 2 (two) times daily. 112 g 3   . Diclofenac Sodium (PENNSAID) 2 % SOLN Place 2 g onto the skin 2 (two) times daily. 112 g 3   . diclofenac sodium  (VOLTAREN) 1 % GEL Apply 2 g topically 4 (four) times daily. 100 g 0   . Diclofenac Sodium 2 % SOLN Place 2 g onto the skin 2 (two) times daily. 112 g 3   . doxycycline (VIBRA-TABS) 100 MG tablet Take 1 tablet (100 mg total) by mouth 2 (two) times daily. 42 tablet 0   . gabapentin (NEURONTIN) 100 MG capsule Take 2 capsules (200 mg total) by mouth at bedtime. 180 capsule 1   . HYDROcodone-acetaminophen (NORCO/VICODIN) 5-325 MG tablet Take 1-2 tablets by mouth every 6 (six) hours as needed. 6 tablet 0   . hydrOXYzine (VISTARIL)  25 MG capsule TAKE 1 CAPSULE BY MOUTH AT BEDTIME 90 capsule 1   . levothyroxine (SYNTHROID, LEVOTHROID) 50 MCG tablet TAKE 1 TABLET BY MOUTH EVERY DAY 90 tablet 1   . lidocaine (LIDODERM) 5 % Place 1 patch onto the skin daily. Remove & Discard patch within 12 hours or as directed by MD 30 patch 0   . meloxicam (MOBIC) 7.5 MG tablet Take 1 tablet (7.5 mg total) by mouth daily. 30 tablet 0   . methocarbamol (ROBAXIN) 500 MG tablet Take 1 tablet (500 mg total) by mouth 2 (two) times daily. 20 tablet 0   . Misc Natural Products (TART CHERRY ADVANCED PO) Take 3 tablets by mouth daily.     . Multiple Vitamins-Minerals (MULTIVITAMIN ADULT PO) Take 1 tablet by mouth daily.     . ondansetron (ZOFRAN ODT) 4 MG disintegrating tablet Take 1 tablet (4 mg total) by mouth every 8 (eight) hours as needed for nausea or vomiting. 20 tablet 0   . predniSONE (DELTASONE) 50 MG tablet Take one tablet daily at breakfast 5 tablet 0   . tiZANidine (ZANAFLEX) 4 MG tablet Take 1 tablet (4 mg total) by mouth every 6 (six) hours as needed for muscle spasms. 30 tablet 0   . traZODone (DESYREL) 50 MG tablet TAKE 1/2 TO 1 TABLET BY MOUTH AT BEDTIME 90 tablet 1   . Turmeric 500 MG CAPS Take 500 mg by mouth daily.     Marland Kitchen venlafaxine XR (EFFEXOR XR) 150 MG 24 hr capsule Take 1 capsule (150 mg total) by mouth daily with breakfast. 90 capsule 1   . Vitamin D, Ergocalciferol, (DRISDOL) 1.25 MG (50000 UNIT) CAPS  capsule TAKE 1 CAPSULE (50,000 UNITS TOTAL) BY MOUTH EVERY 7 (SEVEN) DAYS. 12 capsule 0      Vitals  Temp:  [99.2 F (37.3 C)] 99.2 F (37.3 C) (09/20 1041) Pulse Rate:  [65-93] 65 (09/20 1150) Resp:  [22] 22 (09/20 1042) BP: (129-231)/(77-115) 129/77 (09/20 1150) SpO2:  [99 %-100 %] 99 % (09/20 1040)  There is no height or weight on file to calculate BMI.  Physical Exam   [name]: Laying comfortably in bed; in no acute distress.  HENT: Normal oropharynx and mucosa. Normal external appearance of ears and nose. Neck: Supple, no pain or tenderness CV: No JVD. No peripheral edema. Pulmonary: Symmetric Chest rise. Normal respiratory effort. Abdomen: Soft to touch, non-tender Ext: No cyanosis, edema, or deformity  Skin: No rash. Normal palpation of skin.   Musculoskeletal: Normal digits and nails by inspection. No clubbing.  Neurologic Examination  Mental status/Cognition: Alert, oriented to self, place, month and year, good attention. Speech/language: aphemia. Cranial nerves:   CN II Pupils equal and reactive to light, no VF deficits   CN III,IV,VI EOM intact, no gaze preference or deviation, no nystagmus   CN V normal sensation in V1, V2, and V3 segments bilaterally   CN VII no asymmetry, no nasolabial fold flattening   CN VIII Nods appropriately to questions.   CN IX & X normal palatal elevation, no uvular deviation   CN XI -   CN XII midline tongue protrusion   Motor:  Muscle bulk: normal, tone normal Mvmt Root Nerve  Muscle Right Left Comments  SA C5/6 Ax Deltoid 5    EF C5/6 Mc Biceps 5    EE C6/7/8 Rad Triceps 5    WF C6/7 Med FCR 5    WE C7/8 PIN ECU 5    F Ab C8/T1  U ADM/FDI 5    HF L1/2/3 Fem Illopsoas 5    KE L2/3/4 Fem Quad 5    DF L4/5 D Peron Tib Ant 5    PF S1/2 Tibial Grc/Sol 5     Reflexes:  Right Left Comments  Pectoralis      Biceps (C5/6)     Brachioradialis (C5/6)      Triceps (C6/7)      Patellar (L3/4)      Achilles (S1)      Hoffman       Plantar     Jaw jerk    Sensation:  Light touch Absent in LUE and LLE   Pin prick    Temperature    Vibration   Proprioception    Coordination/Complex Motor:  - Finger to Nose intact on the Right. - Gait: deferred.  Labs   Lab Results  Component Value Date   NA 138 06/12/2020   K 3.4 (L) 06/12/2020   CL 106 06/12/2020   CO2 22 06/12/2020   GLUCOSE 123 (H) 06/12/2020   BUN 8 06/12/2020   CREATININE 0.82 06/12/2020   CALCIUM 9.5 06/12/2020   ALBUMIN 3.9 06/12/2020   AST 19 06/12/2020   ALT 19 06/12/2020   ALKPHOS 63 06/12/2020   BILITOT 1.0 06/12/2020   GFRNONAA >60 06/12/2020   GFRAA >60 06/12/2020     Imaging and Diagnostic studies  MRI Brain without contrast: IMPRESSION: Negative for acute infarct Multiple small white matter hyperintensities bilaterally. Several are periventricular location. Findings suspicious for multiple sclerosis.      Impression   Kristin [name] is a 34 y.o. female with PMH significant for  kidney stones, syncope  who we saw as a stroke code for episode of loss of consciousness followed by minimal to no post ictal period but acute onset left sided weakness and unable to speak.  Her neurologic examination is notable for aphemia and Left sided weakness. Given initial concern for pregnancy, a STAT MRI Brain was obtained and was negative for a stroke. Therefore, no tPA was given. MRI however, does show some T2/FLAIR hyperintensities that may be concerning for MS. Althou aphemia can localize to a inferior frontal gyrus but MRI does not show any T2/FLAIR hyperintensities in that region.  It is possible that a potential seizure caused by the noted FLAIR lesions can cause some of her symptoms too, but with no post ictal period, I personally think that it would be a stretch and is unlikely. I will get a routine EEG to evaluate for that.  Her current symptoms specially with aphemia are unlikely to come from spinal cord. But, given the concern for  MS, I think it is reasonable to get an MRI of her Cervical and thoracic spine to evaluate for any T2/STIR lesions in the spinal cord. This would help [place] assess if she would benefit from steroids.  Her betaHCG is negative and tehrefore not concerning for pregnancy, I think it should be okay for [place] to get contrast enhanced MRIs.  Recommendations  - I ordered seizure precautions - I ordered a routine EEG - I ordered Neuro checks Q2Hours - I ordered MRI Brain with contrast, MRI C and T Spine with and without contrast - I ordered a routine EEG - I ordered urine drug screen. - I ordered MS mimics with RPR, Lyme Ab, Mog Ab, NMO panel, APLA Ab, DSDNA, ANA IFA with reflex, serum ACE. - Further recs pending above. ______________________________________________________________________  This patient is critically ill  and at significant risk of neurological worsening, death and care requires constant monitoring of vital signs, hemodynamics,respiratory and cardiac monitoring, neurological assessment, discussion with family, other specialists and medical decision making of high complexity. I spent 65 minutes of neurocritical care time  in the care of  this patient. This was time spent independent of any time provided by nurse practitioner or PA.  Erick BlinksSalman Zurisadai Helminiak [facility] Pager Number [phone] 06/12/2020  1:05 PM  Thank you for the opportunity to take part in the care of this patient. If you have any further questions, please contact the neurology consultation attending.  Signed,  Erick BlinksSalman [place] [facility] Pager Number [phone]

## 2020-06-12 NOTE — Code Documentation (Signed)
Stroke Response Nurse Documentation Code Documentation  Kristin [name] is a 34 y.o. female arriving to [facility] via [employer] on 9/20 with past medical hx of "passing out episodes" noted to have a work-up and EEG completed prior. Pt reported she was coming to [facility] with her boss after noting some bleeding and cramping this morning. Pt reported having two positive pregnancy tests and was concerned for miscarriage. While in the waiting room, the boss asked for help after patient was reported to go unresponsive with "eyes roll backed." Staff helped patient to the stretcher. No reports of falling or injury.   While in the stretcher, pt had a second episode that lasted for one minute per staff and reported to have "eyes roll back" and not responding. Denied any tremor or twitching during the episode. When the patient came to, she noted left sided weakness and she would not speak to staff but would use some sign language and pantomime. Code stroke was activated by MAU staff. Pt was normal when she checked in at 0957 per boss who was with her.    Stroke team came to bedside to evaluate. Labs had been drawn at this time. BP noted to be 177/102. Hx of gestational HTN, but not recently. Pt evaluated and noted to be weak on the left side, not speaking but pantomiming, and some less effort on the left face while trying to smile. NIHSS 15 initially - See Stroke assessment and timeline for details.   Patient to MRI with team. DWI negative for any type of acute stroke per MD Khaliqdina. Patient reassessed and noted to have  left facial droop, left arm weakness, left leg weakness, left decreased sensation, Expressive aphasia  and dysarthria  on exam. Left facial weakness seems to be secondary to effort. Patient is getting better. NIHSS now a 10. Still reports decreased sensation on the left with some weakness and continues to not speak.  Patient is not a candidate for tPA due to not  having a stroke per MD.  Care/Plan: Pt to have seizure work up completed. Q2 Hour mNIHSS/VS.  Bedside handoff with ED RN Early Osmond, Charge RN and Provider.    Lucila Maine  Stroke Response RN

## 2020-06-12 NOTE — MAU Note (Signed)
Denies H/A

## 2020-06-12 NOTE — Procedures (Signed)
Patient Name: Kristin [name]  MRN: [id number]  Epilepsy Attending: Charlsie Quest  Referring Physician/Provider: Dr Erick Blinks Date: 06/12/2020 Duration: 26.34 mins  Patient history:  34 y.o. female with PMH significant for  kidney stones, syncope  who we saw as a stroke code for episode of loss of consciousness followed by minimal to no post ictal period but acute onset left sided weakness and unable to speak. EEG to evaluate for seizure.   Level of alertness: Awake, asleep  AEDs during EEG study: None  Technical aspects: This EEG study was done with scalp electrodes positioned according to the 10-20 International system of electrode placement. Electrical activity was acquired at a sampling rate of 500Hz  and reviewed with a high frequency filter of 70Hz  and a low frequency filter of 1Hz . EEG data were recorded continuously and digitally stored.   Description: The posterior dominant rhythm consists of 11-12 Hz activity of moderate voltage (25-35 uV) seen predominantly in posterior head regions, symmetric and reactive to eye opening and eye closing. Sleep was characterized by vertex waves, sleep spindles (12 to 14 Hz), maximal frontocentral region.  Hyperventilation and photic stimulation were not performed.     Two episodes of left arm twitching, one episode of right arm twitching and staring off were recorded during this study per tech annotation ( difficult to visualize on video). Concomitant eeg before, during and after the study didn't show any eeg change to seizure.   IMPRESSION: This study is within normal limits. No seizures or epileptiform discharges were seen throughout the recording.  Two episodes of left arm twitching, one episode of right arm twitching and staring off were recorded during this study per tech annotation ( difficult to visualize on video) without concomitant eeg change and were likely non epileptic.    Darris Carachure 

## 2020-06-12 NOTE — Progress Notes (Signed)
Called by staff of MAU for patient who had fainted in MAU. Presented to ED for bleeding with concern for miscarriage. Reported to have had positive pregnancy test at home.   On my arrival, she was somewhat alert but could not speak, could not answer questions. Dr. Shawnie Pons at bedside as I was called to another emergency. Code stroke subsequently called for inability to speak and unable to move left side of body.  Pt was seen by neuro and had urgent MRI with negative results. Neg serum HCG despite positive pregnancy test at home, pt to be admitted to hospitalist service. I called and spoke with Dr. Jacqulyn Bath who will come see her.    Baldemar Lenis, M.D. Attending [facility] for [employer])

## 2020-06-12 NOTE — MAU Note (Signed)
Pt transported to 3W 38. RN Lavonia Dana assumed care.

## 2020-06-12 NOTE — MAU Note (Signed)
Stroke team arrived

## 2020-06-12 NOTE — H&P (Addendum)
History and Physical    Kristin GlasgowLauren G Sarver ZOX:[id number] DOB: 11/21/1985 DOA: 06/12/2020  PCP: Patient, No Pcp Per  Patient coming from: Home  I have personally briefly reviewed patient[facility]  Chief Complaint: Strokelike symptoms  HPI: Kristin GlasgowLauren G [name] is a 34 y.o. female with medical history significant of depression/anxiety, syncope, carpal tunnel syndrome, morbid obesity [facility] with sudden onset of lower abdominal pain and some vaginal bleeding.  Upon my evaluation: Patient was not able to talk.  History gathered from husband at bedside and Charity fundraiserN.  Apparently patient had positive urine pregnancy test at home twice recently however this morning she developed sudden onset of crampy lower abdominal pain and was concerned about miscarriage therefore [facility] and management.  While patient was in waiting room area she became unresponsive with her eyes rolled upward.  Episode lasted for 5 minutes.  No seizures, head trauma reported.  Staff helped patient to the stretcher and while she was in the stretcher she had another episode of passing out episode which lasted for 1 minute.  When the staff noted left-sided weakness code stroke was activated.  Patient was seen by neurology.  MRI was ordered which came back negative for stroke, concerning for MS.  Upon my evaluation: Patient resting comfortably on the bed, unable to talk, has left-sided weakness.  Husband at bedside.  Husband tells me that patient had syncopal episode in the past in 2016 and had EEG done which came back negative.  He tells me that whenever patient is anxious or stressed out she could not talk.  She does not have any active vaginal bleeding, her abdominal pain improved.  When I asked her if she has any recent stress-she typed on her phone that "me & my husband are separated"  -she had a 3 episodes of nonbloody  vomiting this morning.  Her serum beta-hCG level came back negative.  Patient reports occipital headache however denies change in vision, facial drop, chest pain, shortness of breath, palpitation, leg swelling, fever, chills, urinary changes.  No history of smoking, illicit drug use however drinks alcohol occasionally.  ED Course: Upon arrival: Patient's blood pressure was elevated, afebrile with leukocytosis of 10.7, CMP shows potassium of 3.4, magnesium: WNL, UDS, procalcitonin level: Pending.  MRI brain was obtained which came back negative for acute stroke however findings suspicious for MS.  Patient was evaluated by n neurology.  [facility] hospitalist consulted for admission due to strokelike symptoms.  Review of Systems: As per HPI otherwise negative.    Past Medical History:  Diagnosis Date  . Kidney stones   . Knee joint pain   . Pars defect of lumbar spine    L5  . Pregnancy induced hypertension   . Syncope     Past Surgical History:  Procedure Laterality Date  . EXTRACORPOREAL SHOCK WAVE LITHOTRIPSY Left 01/30/2017   Procedure: LEFT EXTRACORPOREAL SHOCK WAVE LITHOTRIPSY (ESWL);  Surgeon: Malen GauzeMcKenzie, Patrick L, MD;  Location: [facility];  Service: Urology;  Laterality: Left;  . KIDNEY STONE SURGERY       reports that she has never smoked. She has never used smokeless tobacco. She reports that she does not drink alcohol and does not use drugs.  Allergies  Allergen Reactions  . Amoxicillin Hives    Has patient had a PCN reaction causing immediate rash, facial/tongue/throat swelling, SOB or lightheadedness with hypotension: no Has patient had a  PCN reaction causing severe rash involving mucus membranes or skin necrosis: No Has patient had a PCN reaction that required hospitalization No Has patient had a PCN reaction occurring within the last 10 years: No If all of the above answers are "NO", then may proceed with Cephalosporin use.    Family History  Problem Relation Age of Onset    . CVA Mother   . Hypertension Mother   . Diabetes Mellitus II Mother   . Congestive Heart Failure Mother   . Stroke Mother   . CVA Father   . Hypertension Father   . Diabetes Mellitus II Father   . Heart disease Father   . Stroke Father   . Stroke Sister     Prior to Admission medications   Medication Sig Start Date End Date Taking? Authorizing Provider  Diclofenac Sodium (PENNSAID) 2 % SOLN Place 2 g onto the skin 2 (two) times daily. Patient not taking: Reported on 06/12/2020 07/03/18   [name], DO  Diclofenac Sodium (PENNSAID) 2 % SOLN Place 2 g onto the skin 2 (two) times daily. Patient not taking: Reported on 06/12/2020 10/21/18   [name], DO  diclofenac sodium (VOLTAREN) 1 % GEL Apply 2 g topically 4 (four) times daily. Patient not taking: Reported on 06/12/2020 12/31/16   Gwyneth Sprout, MD  Diclofenac Sodium 2 % SOLN Place 2 g onto the skin 2 (two) times daily. Patient not taking: Reported on 06/12/2020 08/17/19   [name], DO  doxycycline (VIBRA-TABS) 100 MG tablet Take 1 tablet (100 mg total) by mouth 2 (two) times daily. Patient not taking: Reported on 06/12/2020 12/01/19   [name], DO  gabapentin (NEURONTIN) 100 MG capsule Take 2 capsules (200 mg total) by mouth at bedtime. Patient not taking: Reported on 06/12/2020 08/18/17   [name], DO  HYDROcodone-acetaminophen (NORCO/VICODIN) 5-325 MG tablet Take 1-2 tablets by mouth every 6 (six) hours as needed. Patient not taking: Reported on 06/12/2020 01/11/18   Roxy Horseman, PA-C  hydrOXYzine (VISTARIL) 25 MG capsule TAKE 1 CAPSULE BY MOUTH AT BEDTIME Patient not taking: Reported on 06/12/2020 06/28/19   [name], DO  levothyroxine (SYNTHROID, LEVOTHROID) 50 MCG tablet TAKE 1 TABLET BY MOUTH EVERY DAY Patient not taking: Reported on 06/12/2020 09/09/18   [name], DO  lidocaine (LIDODERM) 5 % Place 1 patch onto the skin daily. Remove & Discard patch within 12 hours or as  directed by MD Patient not taking: Reported on 06/12/2020 04/18/18   Janne Napoleon, NP  meloxicam (MOBIC) 7.5 MG tablet Take 1 tablet (7.5 mg total) by mouth daily. Patient not taking: Reported on 06/12/2020 10/18/19   [name], DO  methocarbamol (ROBAXIN) 500 MG tablet Take 1 tablet (500 mg total) by mouth 2 (two) times daily. Patient not taking: Reported on 06/12/2020 04/18/18   Janne Napoleon, NP  ondansetron (ZOFRAN ODT) 4 MG disintegrating tablet Take 1 tablet (4 mg total) by mouth every 8 (eight) hours as needed for nausea or vomiting. Patient not taking: Reported on 06/12/2020 01/15/17   Alvira Monday, MD  predniSONE (DELTASONE) 50 MG tablet Take one tablet daily at breakfast Patient not taking: Reported on 06/12/2020 04/23/18   [name], DO  tiZANidine (ZANAFLEX) 4 MG tablet Take 1 tablet (4 mg total) by mouth every 6 (six) hours as needed for muscle spasms. Patient not taking: Reported on 06/12/2020 02/03/19   [name], DO  traZODone (DESYREL) 50  MG tablet TAKE 1/2 TO 1 TABLET BY MOUTH AT BEDTIME Patient not taking: Reported on 06/12/2020 07/06/19   [name], DO  venlafaxine XR (EFFEXOR XR) 150 MG 24 hr capsule Take 1 capsule (150 mg total) by mouth daily with breakfast. Patient not taking: Reported on 06/12/2020 01/08/18   [name], DO  Vitamin D, Ergocalciferol, (DRISDOL) 1.25 MG (50000 UNIT) CAPS capsule TAKE 1 CAPSULE (50,000 UNITS TOTAL) BY MOUTH EVERY 7 (SEVEN) DAYS. Patient not taking: Reported on 06/12/2020 03/22/20   [name], DO    Physical Exam: Vitals:   06/12/20 1455 06/12/20 1500 06/12/20 1502 06/12/20 1505  BP:   (!) 156/108   Pulse:   84   Resp:      Temp:      TempSrc:      SpO2: 100% 97% 97% 98%    Constitutional: NAD, calm, comfortable, on room air, obese, alert, following commands, aphasic. Eyes: PERRL, lids and conjunctivae normal ENMT: Mucous membranes are moist. Posterior pharynx clear of any exudate or lesions.Normal  dentition.  Neck: normal, supple, no masses, no thyromegaly Respiratory: clear to auscultation bilaterally, no wheezing, no crackles. Normal respiratory effort. No accessory muscle use.  Cardiovascular: Regular rate and rhythm, no murmurs / rubs / gallops. No extremity edema. 2+ pedal pulses. No carotid bruits.  Abdomen: no tenderness, no masses palpated. No hepatosplenomegaly. Bowel sounds positive.  Musculoskeletal: no clubbing / cyanosis. No joint deformity upper and lower extremities. Good ROM, no contractures. Normal muscle tone.  Skin: no rashes, lesions, ulcers. No induration Neurologic: Aphasic.  Power 0 out of 5 in left lower extremity, 3 out of 5 in left upper extremity, 5 out of 5 in right upper and lower extremity.  Sensation intact.   Psychiatric: Normal judgment and insight. Alert and oriented x 3.  Appears anxious and stressed out.   Labs on Admission: I have personally reviewed following labs and imaging studies  CBC: Recent Labs  Lab 06/12/20 1032  WBC 10.7*  HGB 12.9  HCT 41.0  MCV 85.4  PLT 296   Basic Metabolic Panel: Recent Labs  Lab 06/12/20 1032  NA 138  K 3.4*  CL 106  CO2 22  GLUCOSE 123*  BUN 8  CREATININE 0.82  CALCIUM 9.5  MG 1.8   GFR: Estimated Creatinine Clearance: 118.5 mL/min (by C-G formula based on SCr of 0.82 mg/dL). Liver Function Tests: Recent Labs  Lab 06/12/20 1032  AST 19  ALT 19  ALKPHOS 63  BILITOT 1.0  PROT 7.8  ALBUMIN 3.9   No results for input(s): LIPASE, AMYLASE in the last 168 hours. No results for input(s): AMMONIA in the last 168 hours. Coagulation Profile: Recent Labs  Lab 06/12/20 1238  INR 1.1   Cardiac Enzymes: No results for input(s): CKTOTAL, CKMB, CKMBINDEX, TROPONINI in the last 168 hours. BNP (last 3 results) No results for input(s): PROBNP in the last 8760 hours. HbA1C: No results for input(s): HGBA1C in the last 72 hours. CBG: No results for input(s): GLUCAP in the last 168 hours. Lipid  Profile: No results for input(s): CHOL, HDL, LDLCALC, TRIG, CHOLHDL, LDLDIRECT in the last 72 hours. Thyroid Function Tests: Recent Labs    06/12/20 1238  TSH 0.896   Anemia Panel: Recent Labs    06/12/20 1238  VITAMINB12 425  FOLATE 20.3   Urine analysis:    Component Value Date/Time   COLORURINE YELLOW 05/04/2017 1238   APPEARANCEUR CLOUDY (A) 05/04/2017 1238   LABSPEC 1.021  05/04/2017 1238   PHURINE 5.0 05/04/2017 1238   GLUCOSEU NEGATIVE 05/04/2017 1238   HGBUR NEGATIVE 05/04/2017 1238   BILIRUBINUR NEGATIVE 05/04/2017 1238   KETONESUR NEGATIVE 05/04/2017 1238   PROTEINUR NEGATIVE 05/04/2017 1238   UROBILINOGEN 0.2 01/15/2017 1140   NITRITE NEGATIVE 05/04/2017 1238   LEUKOCYTESUR LARGE (A) 05/04/2017 1238    Radiological Exams on Admission: EEG  Result Date: 06/12/2020 Charlsie Quest, MD     06/12/2020  2:51 PM Patient Name: Kristin [name] MRN: [id number] Epilepsy Attending: Charlsie Quest Referring Physician/Provider: Dr Erick Blinks Date: 06/12/2020 Duration: 26.34 mins Patient history:  34 y.o. female with PMH significant for  kidney stones, syncope  who we saw as a stroke code for episode of loss of consciousness followed by minimal to no post ictal period but acute onset left sided weakness and unable to speak. EEG to evaluate for seizure. Level of alertness: Awake, asleep AEDs during EEG study: None Technical aspects: This EEG study was done with scalp electrodes positioned according to the 10-20 International system of electrode placement. Electrical activity was acquired at a sampling rate of 500Hz  and reviewed with a high frequency filter of 70Hz  and a low frequency filter of 1Hz . EEG data were recorded continuously and digitally stored. Description: The posterior dominant rhythm consists of 11-12 Hz activity of moderate voltage (25-35 uV) seen predominantly in posterior head regions, symmetric and reactive to eye opening and eye closing. Sleep was  characterized by vertex waves, sleep spindles (12 to 14 Hz), maximal frontocentral region.  Hyperventilation and photic stimulation were not performed.   Two episodes of left arm twitching, one episode of right arm twitching and staring off were recorded during this study per tech annotation ( difficult to visualize on video). Concomitant eeg before, during and after the study didn't show any eeg change to seizure. IMPRESSION: This study is within normal limits. No seizures or epileptiform discharges were seen throughout the recording. Two episodes of left arm twitching, one episode of right arm twitching and staring off were recorded during this study per tech annotation ( difficult to visualize on video) without concomitant eeg change and were likely non epileptic.   MR BRAIN WO CONTRAST  Result Date: 06/12/2020 CLINICAL DATA:  Acute neuro deficit.  Rule out stroke. EXAM: MRI HEAD WITHOUT CONTRAST TECHNIQUE: Multiplanar, multiecho pulse sequences of the brain and surrounding structures were obtained without intravenous contrast. COMPARISON:  CT head 06/15/2017 FINDINGS: Brain: Negative for acute infarct Multiple small white matter hyperintensities are present. Several these are periventricular in location and are suspicious for demyelinating disease. Negative for hemorrhage or mass lesion. Ventricle size and cerebral volume normal. Vascular: Normal arterial flow voids. Skull and upper cervical spine: Negative Sinuses/Orbits: Mucosal edema paranasal sinuses most prominent in the right maxillary sinus. No air-fluid level. Mastoid clear bilaterally. Negative orbit Other: None IMPRESSION: Negative for acute infarct Multiple small white matter hyperintensities bilaterally. Several are periventricular location. Findings suspicious for multiple sclerosis. Electronically Signed   By: Charlsie Quest M.D.   On: 06/12/2020 11:51    Assessment/Plan Active Problems:   Stroke-like symptoms     Strokelike symptoms: -Patient is aphasic and has left-sided weakness. -MRI negative for acute stroke-concerning for MS. -EEG negative for seizures. -Evaluated by neurology-appreciate input- check RPR, Lyme antibody, Mog Ab, and MO panel, APLA Ab, dsDNA, ANA, serum ACE -MRA brain, MRI cervical and thoracic spine with and without contrast ordered and is pending.  UDS is pending. -Check TSH,  B12, folate level. -On seizure/fall precautions -Frequent neuro checks. -Consult PT/OT/SLP.  Will keep her n.p.o. until she passes bedside swallow evaluation. -Monitor vitals closely.  Hypokalemia: Replenished.  Check magnesium level.  Repeat BMP tomorrow a.m.  Elevated blood pressure without diagnosis of hypertension: -Monitor blood pressure closely.   -Hydralazine as needed for blood pressure more than 160/100.  Lower abdominal pain: -Beta-hCG negative. -Check lipase, UA. -Morphine as needed for pain control.  Depression with anxiety: -Does not take any medications at home.  Morbid obesity: -Diet modification/weight loss recommended.  DVT prophylaxis: Lovenox/SCD Code Status: Full code Family Communication: Patient's husband present at bedside.  Plan of care discussed with patient in length and she verbalized understanding and agreed with it. Disposition Plan: Home in 2 to 3 days Consults called: Neurology  Admission status: Inpatient   Ollen Bowl MD [facility]  If 7PM-7AM, please contact night-coverage [email or web address] Password [facility]  06/12/2020, 3:49 PM

## 2020-06-12 NOTE — MAU Note (Signed)
Seizure pads applied to bed. Patient's significant other at bedside.

## 2020-06-12 NOTE — MAU Note (Signed)
Unable to move left side of body.  Per pt's boss. " Was at work, started having extreme abd pain, ? 6-8wks preg.  Hx of 2 prior preg. [facility]ed [facility] from [facility], which was hard.  Was registered and in wheelchair waiting.  All of the sudden her head flopped back, started gasping for air. "  Boss had reviewed med hx of file, allergic to Amoxicillin, NO meds...Marland Kitchen. confirms is not taking any meds.   Hx of kidney stones.  Mom had 2 strokes, has died, dad has cardiac hx.

## 2020-06-12 NOTE — MAU Provider Note (Signed)
History     CSN: [id number]  Arrival date and time: 06/12/20 9381   First Provider Initiated Contact with Patient 06/12/20 1022      Chief Complaint  Patient presents with  . Abdominal Pain  . Possible Pregnancy   HPI Kristin [name] is a 34 y.o. [phone] who presents to MAU with chief complaint of abdominal cramping in the setting of positive home pregnancy tests.   Patient unresponsive in wheelchair shortly after signing in to MAU. Patient is accompanied by her coworker who states patient was at cognitive baseline on sign-in to MAU around 0956.  Patient responsive to ammonia. Able to stand and pivot with assist x 3 to stretcher in MAU exam room. Using sign language to describe pervasive family history of CVAs.   OB History    Gravida  4   Para  2   Term  2   Preterm  0   AB  2   Living  2     SAB  2   TAB      Ectopic      Multiple  0   Live Births  1           Past Medical History:  Diagnosis Date  . Kidney stones   . Knee joint pain   . Pars defect of lumbar spine    L5  . Pregnancy induced hypertension   . Syncope     Past Surgical History:  Procedure Laterality Date  . EXTRACORPOREAL SHOCK WAVE LITHOTRIPSY Left 01/30/2017   Procedure: LEFT EXTRACORPOREAL SHOCK WAVE LITHOTRIPSY (ESWL);  Surgeon: Malen Gauze, MD;  Location: [facility];  Service: Urology;  Laterality: Left;  . KIDNEY STONE SURGERY      Family History  Problem Relation Age of Onset  . CVA Mother   . Hypertension Mother   . Diabetes Mellitus II Mother   . Congestive Heart Failure Mother   . Stroke Mother   . CVA Father   . Hypertension Father   . Diabetes Mellitus II Father   . Heart disease Father   . Stroke Father   . Stroke Sister     Social History   Tobacco Use  . Smoking status: Never Smoker  . Smokeless tobacco: Never Used  Substance Use Topics  . Alcohol use: No  . Drug use: No    Allergies:  Allergies  Allergen Reactions  . Amoxicillin Hives     Has patient had a PCN reaction causing immediate rash, facial/tongue/throat swelling, SOB or lightheadedness with hypotension: no Has patient had a PCN reaction causing severe rash involving mucus membranes or skin necrosis: No Has patient had a PCN reaction that required hospitalization No Has patient had a PCN reaction occurring within the last 10 years: No If all of the above answers are "NO", then may proceed with Cephalosporin use.    Medications Prior to Admission  Medication Sig Dispense Refill Last Dose  . Ascorbic Acid (VITAMIN C) 500 MG CAPS Take 500 mg by mouth daily.     . Diclofenac Sodium (PENNSAID) 2 % SOLN Place 2 g onto the skin 2 (two) times daily. 112 g 3   . Diclofenac Sodium (PENNSAID) 2 % SOLN Place 2 g onto the skin 2 (two) times daily. 112 g 3   . diclofenac sodium (VOLTAREN) 1 % GEL Apply 2 g topically 4 (four) times daily. 100 g 0   . Diclofenac Sodium 2 % SOLN Place 2 g onto the skin  2 (two) times daily. 112 g 3   . doxycycline (VIBRA-TABS) 100 MG tablet Take 1 tablet (100 mg total) by mouth 2 (two) times daily. 42 tablet 0   . gabapentin (NEURONTIN) 100 MG capsule Take 2 capsules (200 mg total) by mouth at bedtime. 180 capsule 1   . HYDROcodone-acetaminophen (NORCO/VICODIN) 5-325 MG tablet Take 1-2 tablets by mouth every 6 (six) hours as needed. 6 tablet 0   . hydrOXYzine (VISTARIL) 25 MG capsule TAKE 1 CAPSULE BY MOUTH AT BEDTIME 90 capsule 1   . levothyroxine (SYNTHROID, LEVOTHROID) 50 MCG tablet TAKE 1 TABLET BY MOUTH EVERY DAY 90 tablet 1   . lidocaine (LIDODERM) 5 % Place 1 patch onto the skin daily. Remove & Discard patch within 12 hours or as directed by MD 30 patch 0   . meloxicam (MOBIC) 7.5 MG tablet Take 1 tablet (7.5 mg total) by mouth daily. 30 tablet 0   . methocarbamol (ROBAXIN) 500 MG tablet Take 1 tablet (500 mg total) by mouth 2 (two) times daily. 20 tablet 0   . Misc Natural Products (TART CHERRY ADVANCED PO) Take 3 tablets by mouth daily.      . Multiple Vitamins-Minerals (MULTIVITAMIN ADULT PO) Take 1 tablet by mouth daily.     . ondansetron (ZOFRAN ODT) 4 MG disintegrating tablet Take 1 tablet (4 mg total) by mouth every 8 (eight) hours as needed for nausea or vomiting. 20 tablet 0   . predniSONE (DELTASONE) 50 MG tablet Take one tablet daily at breakfast 5 tablet 0   . tiZANidine (ZANAFLEX) 4 MG tablet Take 1 tablet (4 mg total) by mouth every 6 (six) hours as needed for muscle spasms. 30 tablet 0   . traZODone (DESYREL) 50 MG tablet TAKE 1/2 TO 1 TABLET BY MOUTH AT BEDTIME 90 tablet 1   . Turmeric 500 MG CAPS Take 500 mg by mouth daily.     Marland Kitchen venlafaxine XR (EFFEXOR XR) 150 MG 24 hr capsule Take 1 capsule (150 mg total) by mouth daily with breakfast. 90 capsule 1   . Vitamin D, Ergocalciferol, (DRISDOL) 1.25 MG (50000 UNIT) CAPS capsule TAKE 1 CAPSULE (50,000 UNITS TOTAL) BY MOUTH EVERY 7 (SEVEN) DAYS. 12 capsule 0     Review of Systems  Gastrointestinal: Positive for abdominal pain.  Neurological: Positive for seizures, syncope, speech difficulty and weakness. Negative for headaches.  All other systems reviewed and are negative.  Physical Exam   Blood pressure 129/77, pulse 65, temperature 99.2 F (37.3 C), temperature source Axillary, resp. rate (!) 22, SpO2 99 %, currently breastfeeding.  Physical Exam Vitals and nursing note reviewed. Exam conducted with a chaperone present.  Cardiovascular:     Rate and Rhythm: Normal rate.  Pulmonary:     Effort: Pulmonary effort is normal.  Skin:    General: Skin is warm and dry.     Capillary Refill: Capillary refill takes less than 2 seconds.  Neurological:     Mental Status: She is lethargic.     Cranial Nerves: Facial asymmetry present.     Sensory: Sensory deficit present.     Motor: Weakness present.     Gait: Gait abnormal.   Persistent left-sided neglect noted. Asymmetrical smile.  MAU Course  Procedures  --Patient awake, persistently non-verbal following  ammonia inhalant --Dr. Shawnie Pons at bedside --Code Stroke initiated --Patient to MRI with Stroke Team, Neuro at bedside shortly before departure to MRI --Hospitalist contacted by Dr. Earlene Plater, inbound to bedside  Results for orders  placed or performed during the hospital encounter of 06/12/20 (from the past 24 hour(s))  CBC     Status: Abnormal   Collection Time: 06/12/20 10:32 AM  Result Value Ref Range   WBC 10.7 (H) 4.0 - 10.5 K/uL   RBC 4.80 3.87 - 5.11 MIL/uL   Hemoglobin 12.9 12.0 - 15.0 g/dL   HCT 17.5 36 - 46 %   MCV 85.4 80.0 - 100.0 fL   MCH 26.9 26.0 - 34.0 pg   MCHC 31.5 30.0 - 36.0 g/dL   RDW 10.2 58.5 - 27.7 %   Platelets 296 150 - 400 K/uL   nRBC 0.0 0.0 - 0.2 %  Comprehensive metabolic panel     Status: Abnormal   Collection Time: 06/12/20 10:32 AM  Result Value Ref Range   Sodium 138 135 - 145 mmol/L   Potassium 3.4 (L) 3.5 - 5.1 mmol/L   Chloride 106 98 - 111 mmol/L   CO2 22 22 - 32 mmol/L   Glucose, Bld 123 (H) 70 - 99 mg/dL   BUN 8 6 - 20 mg/dL   Creatinine, Ser 8.24 0.44 - 1.00 mg/dL   Calcium 9.5 8.9 - 23.5 mg/dL   Total Protein 7.8 6.5 - 8.1 g/dL   Albumin 3.9 3.5 - 5.0 g/dL   AST 19 15 - 41 U/L   ALT 19 0 - 44 U/L   Alkaline Phosphatase 63 38 - 126 U/L   Total Bilirubin 1.0 0.3 - 1.2 mg/dL   GFR calc non Af Amer >60 >60 mL/min   GFR calc Af Amer >60 >60 mL/min   Anion gap 10 5 - 15  Procalcitonin     Status: None   Collection Time: 06/12/20 10:32 AM  Result Value Ref Range   Procalcitonin <0.10 ng/mL  hCG, quantitative, pregnancy     Status: None   Collection Time: 06/12/20 10:32 AM  Result Value Ref Range   hCG, Beta Chain, Quant, S <1 <5 mIU/mL  Magnesium     Status: None   Collection Time: 06/12/20 10:32 AM  Result Value Ref Range   Magnesium 1.8 1.7 - 2.4 mg/dL   Assessment and Plan  --34 y.o. [id number] non-pregnant patient --Non-verbal throughout period of evaluation in MAU --Left-sided neglect throughout period of evaluation in MAU --Per  Hospitalist, admit to Telemetry --Patient departed unit at approximately [address]  Calvert Cantor, CNM 06/12/2020, 3:19 PM

## 2020-06-13 ENCOUNTER — Encounter (HOSPITAL_COMMUNITY): Payer: Self-pay | Admitting: Internal Medicine

## 2020-06-13 DIAGNOSIS — R9089 Other abnormal findings on diagnostic imaging of central nervous system: Secondary | ICD-10-CM | POA: Diagnosis not present

## 2020-06-13 DIAGNOSIS — R4182 Altered mental status, unspecified: Secondary | ICD-10-CM | POA: Diagnosis present

## 2020-06-13 DIAGNOSIS — R4701 Aphasia: Secondary | ICD-10-CM | POA: Diagnosis not present

## 2020-06-13 HISTORY — DX: Altered mental status, unspecified: R41.82

## 2020-06-13 LAB — COMPREHENSIVE METABOLIC PANEL
ALT: 18 U/L (ref 0–44)
AST: 19 U/L (ref 15–41)
Albumin: 3.5 g/dL (ref 3.5–5.0)
Alkaline Phosphatase: 59 U/L (ref 38–126)
Anion gap: 9 (ref 5–15)
BUN: 11 mg/dL (ref 6–20)
CO2: 25 mmol/L (ref 22–32)
Calcium: 9.4 mg/dL (ref 8.9–10.3)
Chloride: 107 mmol/L (ref 98–111)
Creatinine, Ser: 0.8 mg/dL (ref 0.44–1.00)
GFR calc Af Amer: >60 mL/min (ref >60)
GFR calc non Af Amer: >60 mL/min (ref >60)
Glucose, Bld: 106 mg/dL — ABNORMAL HIGH (ref 70–99)
Potassium: 3.5 mmol/L (ref 3.5–5.1)
Sodium: 141 mmol/L (ref 135–145)
Total Bilirubin: 0.7 mg/dL (ref 0.3–1.2)
Total Protein: 6.7 g/dL (ref 6.5–8.1)

## 2020-06-13 LAB — CBC
HCT: 40.7 % (ref 36.0–46.0)
Hemoglobin: 12.7 g/dL (ref 12.0–15.0)
MCH: 27.1 pg (ref 26.0–34.0)
MCHC: 31.2 g/dL (ref 30.0–36.0)
MCV: 87 fL (ref 80.0–100.0)
Platelets: 284 10*3/uL (ref 150–400)
RBC: 4.68 MIL/uL (ref 3.87–5.11)
RDW: 13.8 % (ref 11.5–15.5)
WBC: 9.4 10*3/uL (ref 4.0–10.5)
nRBC: 0 % (ref 0.0–0.2)

## 2020-06-13 LAB — LIPID PANEL
Cholesterol: 131 mg/dL (ref 0–200)
HDL: 34 mg/dL — ABNORMAL LOW (ref >40)
LDL Cholesterol: 79 mg/dL (ref 0–99)
Total CHOL/HDL Ratio: 3.9 RATIO
Triglycerides: 90 mg/dL (ref <150)
VLDL: 18 mg/dL (ref 0–40)

## 2020-06-13 LAB — RPR: RPR Ser Ql: NONREACTIVE

## 2020-06-13 LAB — ANA W/REFLEX IF POSITIVE: Anti Nuclear Antibody (ANA): NEGATIVE

## 2020-06-13 LAB — ANGIOTENSIN CONVERTING ENZYME: Angiotensin-Converting Enzyme: 32 U/L (ref 14–82)

## 2020-06-13 LAB — B. BURGDORFI ANTIBODIES: B burgdorferi Ab IgG+IgM: <0.91 {ISR} (ref 0.00–0.90)

## 2020-06-13 LAB — ANTI-DNA ANTIBODY, DOUBLE-STRANDED: ds DNA Ab: <1 IU/mL (ref 0–9)

## 2020-06-13 LAB — HOMOCYSTEINE: Homocysteine: 10.1 umol/L (ref 0.0–14.5)

## 2020-06-13 LAB — HEMOGLOBIN A1C
Hgb A1c MFr Bld: 5 % (ref 4.8–5.6)
Mean Plasma Glucose: 96.8 mg/dL

## 2020-06-13 MED ORDER — POTASSIUM CHLORIDE CRYS ER 20 MEQ PO TBCR
40.0000 meq | EXTENDED_RELEASE_TABLET | Freq: Once | ORAL | Status: AC
  Administered 2020-06-13: 40 meq via ORAL
  Filled 2020-06-13: qty 2

## 2020-06-13 MED ORDER — ONDANSETRON 4 MG PO TBDP
ORAL_TABLET | ORAL | Status: AC
  Filled 2020-06-13: qty 1

## 2020-06-13 NOTE — Evaluation (Signed)
Occupational Therapy Evaluation Patient Details Name: Kristin [name] MRN: [id number] DOB: Mar 10, 1986 Today's Date: 06/13/2020    History of Present Illness 34 yo female admitted with elevated BP 177/102 NIH 15 L facial droop L arm weakness L leg weakness expressive aphasia and dysarthria. MRI T2 / Flair hyperintensities concern for MS PMH Fall 9/10 with STM deficit, kidney stones, Pars defect L5; syncope 2016   Clinical Impression   Patient evaluated by Occupational Therapy with no further acute OT needs identified. All education has been completed and the patient has no further questions. See below for any follow-up Occupational Therapy or equipment needs. OT to sign off. Thank you for referral.      Follow Up Recommendations  No OT follow up    Equipment Recommendations  None recommended by OT    Recommendations for Other Services       Precautions / Restrictions Precautions Precautions: Fall      Mobility Bed Mobility Overal bed mobility: Independent                Transfers Overall transfer level: Modified independent                    Balance                                           ADL either performed or assessed with clinical judgement   ADL Overall ADL's : Modified independent                                       General ADL Comments: completed grooming task, simulated tub transfer, completed LB dressing, bed mobility and toilet transfer. Pt with decreased speed reports i dont want to fall but able to complete the task with caution.      Vision Baseline Vision/History: Wears glasses Wears Glasses: At all times       Perception     Praxis      Pertinent Vitals/Pain Pain Assessment: No/denies pain     Hand Dominance Right   Extremity/Trunk Assessment Upper Extremity Assessment Upper Extremity Assessment: Overall WFL for tasks assessed;RUE deficits/detail RUE Deficits / Details: reports  baseline cyst at wrist that causes numbness in 3rd and 4th digits RUE Coordination: WNL   Lower Extremity Assessment Lower Extremity Assessment: Defer to PT evaluation   Cervical / Trunk Assessment Cervical / Trunk Assessment: Normal   Communication Communication Communication: Expressive difficulties   Cognition Arousal/Alertness: Awake/alert Behavior During Therapy: WFL for tasks assessed/performed                                       General Comments       Exercises     Shoulder Instructions      Home Living Family/patient expects to be discharged to:: Private residence Living Arrangements: Spouse/significant other;Children Available Help at Discharge: Family;Available PRN/intermittently Type of Home: House Home Access: Stairs to enter [employer] of Steps: 2 Entrance Stairs-Rails: Left;Right Home Layout: One level     Bathroom Shower/Tub: Producer, television/film/video: Handicapped height     Home Equipment: None   Additional Comments: lives with spouse and x2 kids driving  Prior Functioning/Environment Level of Independence: Independent                 OT Problem List:        OT Treatment/Interventions:      OT Goals(Current goals can be found in the care plan section) Acute Rehab OT Goals Patient Stated Goal: none stated  OT Frequency:     Barriers to D/C:            Co-evaluation              AM-PAC OT "6 Clicks" Daily Activity     Outcome Measure Help from another person eating meals?: None Help from another person taking care of personal grooming?: None Help from another person toileting, which includes using toliet, bedpan, or urinal?: None Help from another person bathing (including washing, rinsing, drying)?: None Help from another person to put on and taking off regular upper body clothing?: None Help from another person to put on and taking off regular lower body clothing?: None 6 Click  Score: 24   End of Session Equipment Utilized During Treatment: Gait belt Nurse Communication: Mobility status;Precautions  Activity Tolerance: Patient tolerated treatment well Patient left: Other (comment) (with PT in hall)  OT Visit Diagnosis: Unsteadiness on feet (R26.81)                Time: 1017-1030 OT Time Calculation (min): 13 min Charges:  OT General Charges $OT Visit: 1 Visit OT Evaluation $OT Eval Low Complexity: 1 Low   Brynn, OTR/L  Acute Rehabilitation Services Pager: [phone] Office: [phone] .   Mateo Flow 06/13/2020, 10:42 AM

## 2020-06-13 NOTE — Discharge Summary (Signed)
[facility]  Physician Discharge Summary   Patient ID: Kristin [name] MRN: [id number] DOB/AGE: 1986/07/11 34 y.o.  Admit date: 06/12/2020 Discharge date: 06/13/2020  PCP: Patient, No Pcp Per  DISCHARGE DIAGNOSES:  Altered Mental Status, resolved Abnormal MRI Brain, cervical and thoracic spine   RECOMMENDATIONS FOR OUTPATIENT FOLLOW UP: 1. Ambulatory referral sent to Dr. Bonnita Hollow office. 2. Following lab results are still pending: RPR, Lyme antibody, Mog Ab, NMO panel, APLA Ab, dsDNA, ANA, serum ACE  Home Health: None Equipment/Devices: None  CODE STATUS: Full code  DISCHARGE CONDITION: fair  Diet recommendation: Regular as tolerated  INITIAL HISTORY: 34 y.o. female with medical history significant of depression/anxiety, syncope, carpal tunnel syndrome, morbid obesity [facility] sudden onset of lower abdominal pain and some vaginal bleeding.  Upon my evaluation: Patient was not able to talk.  History gathered from husband at bedside and Charity fundraiser.  Apparently patient had positive urine pregnancy test at home twice recently however this morning she developed sudden onset of crampy lower abdominal pain and was concerned about miscarriage therefore [facility] and [facility].  While patient was in waiting room area she became unresponsive with her eyes rolled upward.  Episode lasted for 5 minutes.  No seizures, head trauma reported.  Staff helped patient to the stretcher and while she was in the stretcher she had another episode of passing out episode which lasted for 1 minute.  When the staff noted left-sided weakness code stroke was activated.  Patient was seen by neurology.  MRI was ordered which came back negative for stroke, concerning for MS.  Upon my evaluation: Patient resting comfortably on the bed, unable to talk, has left-sided weakness.  Husband at bedside.  Husband tells me that  patient had syncopal episode in the past in 2016 and had EEG done which came back negative.  He tells me that whenever patient is anxious or stressed out she could not talk.  She does not have any active vaginal bleeding, her abdominal pain improved.  When I asked her if she has any recent stress-she typed on her phone that "me & my husband are separated"  -she had a 3 episodes of nonbloody vomiting this morning.  Her serum beta-hCG level came back negative.  Patient reports occipital headache however denies change in vision, facial drop, chest pain, shortness of breath, palpitation, leg swelling, fever, chills, urinary changes.  No history of smoking, illicit drug use however drinks alcohol occasionally.  ED Course: Upon arrival: Patient's blood pressure was elevated, afebrile with leukocytosis of 10.7, CMP shows potassium of 3.4, magnesium: WNL, UDS, procalcitonin level: Pending.  MRI brain was obtained which came back negative for acute stroke however findings suspicious for MS.  Patient was evaluated by n neurology.  [facility] consulted for admission due to strokelike symptoms.   Consultations:  Neurology   HOSPITAL COURSE:   Altered mental status During assessment yesterday there was concern for strokelike symptoms.  Apparently patient was aphasic and had left-sided weakness.  MRI however was negative for streoke.   EEG negative for seizure activity.   Patient seen by neurology.  MRI brain cervical and thoracic spine ordered with and without contrast.  Results are not definitely conclusive for demyelinating process.   Metabolic work-up was initiated.  Vitamin B12 425, folate 20.3, HbA1c 5.0, TSH 0.89, HIV nonreactive.  UA noted to be abnormal but patient is currently on her menstrual cycle.  She  denies any dysuria.   Patient seems to have improved.  She is talking today.  Denies any complaints.  She appears not to remember yesterday's events. Has ambulated to the bathroom.   Seen by OT. Following lab results are still pending: RPR, Lyme antibody, Mog Ab, NMO panel, APLA Ab, dsDNA, ANA, serum ACE Seen by neurology who opines that her symptoms were due to stress. But cannot rule out MS. Recommends referral to outpatient neurologist. Will refer to Dr. Epimenio Foot.  Hypokalemia Repleted.  Normal today.  Magnesium was 1.8.  Elevated blood pressure without diagnosis of hypertension: Noted to be normotensive this morning.  Will not initiate any antihypertensives.  Lower abdominal pain Patient denies any pain.  There was some concern for her being pregnant yesterday but beta-hCG is negative.  Lipase was normal.  LFTs are normal.  UA is abnormal but patient is currently on her menstrual cycle.  She denies any dysuria.  No flank pain.  She does have a history of nephrolithiasis.  No need for any work-up at this time since patient is asymptomatic.  Depression with anxiety: Does not take any medications at home.  Morbid obesity: Estimated body mass index is 40.52 kg/m as calculated from the following:   Height as of 05/25/20:  (1.651 m).   Weight as of 06/01/20: 110.5 kg.   Discussed with neurologist. Ok for discharge today.   PERTINENT LABS:  The results of significant diagnostics from this hospitalization (including imaging, microbiology, ancillary and laboratory) are listed below for reference.    Microbiology: Recent Results (from the past 240 hour(s))  SARS Coronavirus 2 by RT PCR (hospital order, performed in [facility] hospital lab) Nasopharyngeal Nasopharyngeal Swab     Status: None   Collection Time: 06/12/20 12:45 PM   Specimen: Nasopharyngeal Swab  Result Value Ref Range Status   SARS Coronavirus 2 NEGATIVE NEGATIVE Final    Comment: (NOTE) SARS-CoV-2 target nucleic acids are NOT DETECTED.  The SARS-CoV-2 RNA is generally detectable in upper and lower respiratory specimens during the acute phase of infection. The lowest concentration of  SARS-CoV-2 viral copies this assay can detect is 250 copies / mL. A negative result does not preclude SARS-CoV-2 infection and should not be used as the sole basis for treatment or other patient management decisions.  A negative result may occur with improper specimen collection / handling, submission of specimen other than nasopharyngeal swab, presence of viral mutation(s) within the areas targeted by this assay, and inadequate number of viral copies (<250 copies / mL). A negative result must be combined with clinical observations, patient history, and epidemiological information.  Fact Sheet for Patients:   BoilerBrush.com.cy  Fact Sheet for Healthcare Providers: https://pope.com/  This test is not yet approved or  cleared by the [place] FDA and has been authorized for detection and/or diagnosis of SARS-CoV-2 by FDA under an Emergency Use Authorization (EUA).  This EUA will remain in effect (meaning this test can be used) for the duration of the COVID-19 declaration under Section 564(b)(1) of the Act, 21 U.S.C. section 360bbb-3(b)(1), unless the authorization is terminated or revoked sooner.  Performed at [facility], [address] [place], [place] [address]      Labs:    Basic Metabolic Panel: Recent Labs  Lab 06/12/20 1032 06/13/20 0505  NA 138 141  K 3.4* 3.5  CL 106 107  CO2 22 25  GLUCOSE 123* 106*  BUN 8 11  CREATININE 0.82 0.80  CALCIUM 9.5  9.4  MG 1.8  --    Liver Function Tests: Recent Labs  Lab 06/12/20 1032 06/13/20 0505  AST 19 19  ALT 19 18  ALKPHOS 63 59  BILITOT 1.0 0.7  PROT 7.8 6.7  ALBUMIN 3.9 3.5   Recent Labs  Lab 06/12/20 1032  LIPASE 25   CBC: Recent Labs  Lab 06/12/20 1032 06/13/20 0505  WBC 10.7* 9.4  HGB 12.9 12.7  HCT 41.0 40.7  MCV 85.4 87.0  PLT 296 284     IMAGING STUDIES EEG  Result Date: 06/12/2020 Charlsie QuestYadav, Priyanka O, MD     06/12/2020  2:51 PM  Patient Name: Barnet GlasgowLauren G Swanger MRN: [id number] Epilepsy Attending: Charlsie QuestPriyanka O Yadav Referring Physician/Provider: Dr Erick BlinksSalman Khaliqdina Date: 06/12/2020 Duration: 26.34 mins Patient history:  34 y.o. female with PMH significant for  kidney stones, syncope  who we saw as a stroke code for episode of loss of consciousness followed by minimal to no post ictal period but acute onset left sided weakness and unable to speak. EEG to evaluate for seizure. Level of alertness: Awake, asleep AEDs during EEG study: None Technical aspects: This EEG study was done with scalp electrodes positioned according to the 10-20 International system of electrode placement. Electrical activity was acquired at a sampling rate of 500Hz  and reviewed with a high frequency filter of 70Hz  and a low frequency filter of 1Hz . EEG data were recorded continuously and digitally stored. Description: The posterior dominant rhythm consists of 11-12 Hz activity of moderate voltage (25-35 uV) seen predominantly in posterior head regions, symmetric and reactive to eye opening and eye closing. Sleep was characterized by vertex waves, sleep spindles (12 to 14 Hz), maximal frontocentral region.  Hyperventilation and photic stimulation were not performed.   Two episodes of left arm twitching, one episode of right arm twitching and staring off were recorded during this study per tech annotation ( difficult to visualize on video). Concomitant eeg before, during and after the study didn't show any eeg change to seizure. IMPRESSION: This study is within normal limits. No seizures or epileptiform discharges were seen throughout the recording. Two episodes of left arm twitching, one episode of right arm twitching and staring off were recorded during this study per tech annotation ( difficult to visualize on video) without concomitant eeg change and were likely non epileptic. Charlsie Questriyanka O Yadav   MR BRAIN WO CONTRAST  Result Date: 06/12/2020 CLINICAL DATA:  Acute neuro  deficit.  Rule out stroke. EXAM: MRI HEAD WITHOUT CONTRAST TECHNIQUE: Multiplanar, multiecho pulse sequences of the brain and surrounding structures were obtained without intravenous contrast. COMPARISON:  CT head 06/15/2017 FINDINGS: Brain: Negative for acute infarct Multiple small white matter hyperintensities are present. Several these are periventricular in location and are suspicious for demyelinating disease. Negative for hemorrhage or mass lesion. Ventricle size and cerebral volume normal. Vascular: Normal arterial flow voids. Skull and upper cervical spine: Negative Sinuses/Orbits: Mucosal edema paranasal sinuses most prominent in the right maxillary sinus. No air-fluid level. Mastoid clear bilaterally. Negative orbit Other: None IMPRESSION: Negative for acute infarct Multiple small white matter hyperintensities bilaterally. Several are periventricular location. Findings suspicious for multiple sclerosis. Electronically Signed   By: Marlan Palauharles  Clark M.D.   On: 06/12/2020 11:51   MR BRAIN W CONTRAST  Result Date: 06/12/2020 CLINICAL DATA:  Multiple sclerosis, new event, evaluate for contrast enhancement. EXAM: MRI HEAD WITH CONTRAST TECHNIQUE: Multiplanar, multiecho pulse sequences of the brain and surrounding structures were obtained with intravenous contrast. CONTRAST:  10mL  GADAVIST GADOBUTROL 1 MMOL/ML IV SOLN COMPARISON:  Brain MRI 06/12/2020.  Head CT 06/15/2017. FINDINGS: Brain: T1 weighted postcontrast imaging of the brain was performed as a follow-up to the noncontrast brain MRI performed earlier the same day 06/12/2020. Additionally, a 3D T2/FLAIR sequence was also acquired for the purposes of localization. No abnormal intracranial enhancement is demonstrated to suggest active demyelination. Vascular: No unexpected finding Skull and upper cervical spine: No enhancing marrow lesion. Sinuses/Orbits: No abnormal intraorbital enhancement is identified. Mild ethmoid sinus mucosal thickening. Right  maxillary sinus mucous retention cyst. IMPRESSION: No abnormal intracranial enhancement is demonstrated to suggest active demyelination. Electronically Signed   By: Jackey Loge DO   On: 06/12/2020 17:34   MR CERVICAL SPINE W WO CONTRAST  Result Date: 06/12/2020 CLINICAL DATA:  Multiple sclerosis, new event. EXAM: MRI CERVICAL SPINE WITHOUT AND WITH CONTRAST TECHNIQUE: Multiplanar and multiecho pulse sequences of the cervical spine, to include the craniocervical junction and cervicothoracic junction, were obtained without and with intravenous contrast. CONTRAST:  26mL GADAVIST GADOBUTROL 1 MMOL/ML IV SOLN COMPARISON:  CT of the cervical spine 05/04/2017. FINDINGS: Alignment: Straightening of the expected cervical lordosis. No significant spondylolisthesis. Vertebrae: Vertebral body height is maintained. No marrow edema or focal suspicious osseous lesion. Cord: No cervical spinal cord signal abnormality is identified. No abnormal cord enhancement. Posterior Fossa, vertebral arteries, paraspinal tissues: Posterior fossa better assessed on concurrently performed brain MRI. Flow voids preserved within the imaged cervical vertebral arteries. Paraspinal soft tissues within normal limits. Disc levels: Mild multilevel disc degeneration. C2-C3: No significant disc herniation or stenosis. C3-C4: No significant disc herniation or stenosis. C4-C5: No significant disc herniation or stenosis. C5-C6: Disc bulge. Superimposed shallow central disc protrusion slightly eccentric to the right. The disc protrusion tribute to mild relative spinal canal narrowing, contacting and mildly flattening the ventral spinal cord. No significant foraminal stenosis. C6-C7: No significant disc herniation or stenosis. C7-T1: No significant disc herniation or stenosis. IMPRESSION: No cervical spinal cord signal abnormality or abnormal cord enhancement is identified. At C5-C6, a small central disc protrusion contributes to multifactorial mild spinal  canal narrowing, contacting and mildly flattening the ventral spinal cord. Electronically Signed   By: Jackey Loge DO   On: 06/12/2020 17:38   MR THORACIC SPINE W WO CONTRAST  Result Date: 06/12/2020 CLINICAL DATA:  Demyelinating disease, new event. EXAM: MRI THORACIC WITHOUT AND WITH CONTRAST TECHNIQUE: Multiplanar and multiecho pulse sequences of the thoracic spine were obtained without and with intravenous contrast. CONTRAST:  4mL GADAVIST GADOBUTROL 1 MMOL/ML IV SOLN COMPARISON:  No pertinent prior exams are available for comparison. FINDINGS: MRI THORACIC SPINE FINDINGS Alignment:  No significant spondylolisthesis. Vertebrae: Vertebral body height is maintained. No marrow edema or focal suspicious osseous lesion. No abnormal osseous enhancement Cord: A short-segment focus of abnormal T2 hyperintensity is questioned within the spinal cord at the T4-T5 level. However, this focus is only appreciated on the sagittal T2 weighted sequence and could potentially be artifactual (series 23, image 10). No abnormal cord enhancement is identified. Paraspinal and other soft tissues: No abnormality identified within included portions of the thorax or upper abdomen/retroperitoneum. Disc levels: Intervertebral disc height and hydration are maintained. No significant disc herniation, spinal canal stenosis or neural foraminal narrowing at any level. IMPRESSION: A short-segment focus of T2 hyperintense signal abnormality is questioned within the spinal cord at the T4-T5 level. However, this finding is only appreciated on the sagittal T2 weighted sequence and could simply reflect artifact. If this is a real finding,  sequela of demyelinating disease is a differential consideration. No abnormal enhancement to suggest active demyelination. No significant disc herniation, spinal canal stenosis or neural foraminal narrowing at any level. Electronically Signed   By: Jackey Loge DO   On: 06/12/2020 17:47   [place] LIMITED JOINT SPACE  STRUCTURES UP RIGHT(NO LINKED CHARGES)  Result Date: 05/29/2020 Limited musculoskeletal ultrasound was performed and interpreted by [name] Limited ultrasound of patient's right wrist shows that patient's ganglion cyst does seem to be superficial as well as deep to the flexor tendon sheath.  Does seem to be a relatively the same size.  Questionable to more soft tissue swelling noted likely secondary to rupture from the previous aspiration.   DISCHARGE EXAMINATION: Vitals:   06/12/20 2133 06/12/20 2338 06/13/20 0407 06/13/20 0749  BP: 140/68 133/74 138/70 112/69  Pulse: 80 81 76 60  Resp:  18 19 20   Temp: 98.1 F (36.7 C) 98.3 F (36.8 C) 98 F (36.7 C) 98.4 F (36.9 C)  TempSrc: Oral Oral Oral Oral  SpO2: 98% 96% 97% 100%   General appearance: Awake alert.  In no distress Resp: Clear to auscultation bilaterally.  Normal effort Cardio: S1-S2 is normal regular.  No S3-S4.  No rubs murmurs or bruit GI: Abdomen is soft.  Nontender nondistended.  Bowel sounds are present normal.  No masses organomegaly   DISPOSITION: Home    Allergies as of 06/13/2020      Reactions   Amoxicillin Hives   Has patient had a PCN reaction causing immediate rash, facial/tongue/throat swelling, SOB or lightheadedness with hypotension: no Has patient had a PCN reaction causing severe rash involving mucus membranes or skin necrosis: No Has patient had a PCN reaction that required hospitalization No Has patient had a PCN reaction occurring within the last 10 years: No If all of the above answers are "NO", then may proceed with Cephalosporin use.      Medication List    STOP taking these medications   diclofenac sodium 1 % Gel Commonly known as: Voltaren   Diclofenac Sodium 2 % Soln   doxycycline 100 MG tablet Commonly known as: VIBRA-TABS   gabapentin 100 MG capsule Commonly known as: NEURONTIN   HYDROcodone-acetaminophen 5-325 MG tablet Commonly known as: NORCO/VICODIN   hydrOXYzine  25 MG capsule Commonly known as: VISTARIL   levothyroxine 50 MCG tablet Commonly known as: SYNTHROID   lidocaine 5 % Commonly known as: Lidoderm   meloxicam 7.5 MG tablet Commonly known as: MOBIC   methocarbamol 500 MG tablet Commonly known as: ROBAXIN   ondansetron 4 MG disintegrating tablet Commonly known as: Zofran ODT   predniSONE 50 MG tablet Commonly known as: DELTASONE   tiZANidine 4 MG tablet Commonly known as: Zanaflex   traZODone 50 MG tablet Commonly known as: DESYREL   venlafaxine XR 150 MG 24 hr capsule Commonly known as: Effexor XR   Vitamin D (Ergocalciferol) 1.25 MG (50000 UNIT) Caps capsule Commonly known as: DRISDOL          TOTAL DISCHARGE TIME: 35 mins  Jonavon Trieu 06/15/2020 on [email or web address]  06/13/2020, 11:38 AM

## 2020-06-13 NOTE — Evaluation (Signed)
Physical Therapy Evaluation Patient Details Name: Kristin [name] MRN: [id number] DOB: 1986/01/12 Today's Date: 06/13/2020   History of Present Illness  34 yo female admitted with elevated BP 177/102 NIH 15 L facial droop L arm weakness L leg weakness expressive aphasia and dysarthria. MRI T2 / Flair hyperintensities concern for MS PMH Fall 9/10 with STM deficit, kidney stones, Pars defect L5; syncope 2016  Clinical Impression   Pt presents with increased time and effort to mobilize, impaired higher level balance, and decreased activity tolerance vs baseline. Pt to benefit from acute PT to address deficits. 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 Pt reports complete independence at baseline, PT to continue to see acutely to ensure return to baseline mobility. No follow up PT needs at this time, no focal deficits.  PT to progress mobility as tolerated.     Follow Up Recommendations Supervision for mobility/OOB    Equipment Recommendations  None recommended by PT    Recommendations for Other Services       Precautions / Restrictions Precautions Precautions: Fall Restrictions Weight Bearing Restrictions: No      Mobility  Bed Mobility Overal bed mobility: Modified Independent             General bed mobility comments: up with OT, pt returned to bed with MOD I for increased time only  Transfers Overall transfer level: Modified independent               General transfer comment: up with OT  Ambulation/Gait Ambulation/Gait assistance: Supervision Gait Distance (Feet): 450 Feet Assistive device: None Gait Pattern/deviations: Step-through pattern;Decreased stride length;Trunk flexed Gait velocity: decr   General Gait Details: Supervision for safety, slow and steady gait with periods of holding UEs in high guard position.  Stairs Stairs: Yes Stairs assistance: Supervision Stair Management: Two  rails;Alternating pattern;Forwards Number of Stairs: 2 General stair comments: for safety, increased time  Wheelchair Mobility    Modified Rankin (Stroke Patients Only)       Balance Overall balance assessment: Mild deficits observed, not formally tested Sitting-balance support: No upper extremity supported Sitting balance-Leahy Scale: Normal     Standing balance support: No upper extremity supported;During functional activity Standing balance-Leahy Scale: Fair Standing balance comment: accepts challenge well, but requires increased time               High Level Balance Comments: Squat to floor very impaired with increased time, pt typically squats up and down all day at work Standardized Balance Assessment Standardized Balance Assessment : Dynamic Gait Index   Dynamic Gait Index Level Surface: Mild Impairment Change in Gait Speed: Mild Impairment Gait with Horizontal Head Turns: Mild Impairment Gait with Vertical Head Turns: Mild Impairment Gait and Pivot Turn: Mild Impairment Step Over Obstacle: Moderate Impairment Step Around Obstacles: Mild Impairment Steps: Mild Impairment Total Score: 15       Pertinent Vitals/Pain Pain Assessment: No/denies pain    Home Living Family/patient expects to be discharged to:: Private residence Living Arrangements: Spouse/significant other;Children Available Help at Discharge: Family;Available PRN/intermittently Type of Home: House Home Access: Stairs to enter Entrance Stairs-Rails: Lawyer of Steps: 2 Home Layout: One level Home Equipment: None Additional Comments: lives with spouse and x2 kids driving    Prior Function Level of Independence: Independent         Comments: 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     Hand Dominance   Dominant Hand:  Right    Extremity/Trunk Assessment   Upper Extremity Assessment Upper Extremity Assessment: Defer to OT evaluation RUE Deficits /  Details: reports baseline cyst at wrist that causes numbness in 3rd and 4th digits RUE Coordination: WNL    Lower Extremity Assessment Lower Extremity Assessment: Overall WFL for tasks assessed    Cervical / Trunk Assessment Cervical / Trunk Assessment: Normal  Communication   Communication: Expressive difficulties (occasional word-finding difficulties)  Cognition Arousal/Alertness: Awake/alert Behavior During Therapy: WFL for tasks assessed/performed Overall Cognitive Status: Within Functional Limits for tasks assessed                                        General Comments      Exercises     Assessment/Plan    PT Assessment Patient needs continued PT services  PT Problem List Decreased activity tolerance;Decreased balance;Decreased mobility       PT Treatment Interventions Therapeutic activities;Gait training;Balance training;Functional mobility training;Patient/family education    PT Goals (Current goals can be found in the Care Plan section)  Acute Rehab PT Goals Patient Stated Goal: go home ASAP PT Goal Formulation: With patient Time For Goal Achievement: 06/27/20 Potential to Achieve Goals: Good    Frequency Min 3X/week   Barriers to discharge        Co-evaluation               AM-PAC PT "6 Clicks" Mobility  Outcome Measure Help needed turning from your back to your side while in a flat bed without using bedrails?: None Help needed moving from lying on your back to sitting on the side of a flat bed without using bedrails?: None Help needed moving to and from a bed to a chair (including a wheelchair)?: None Help needed standing up from a chair using your arms (e.g., wheelchair or bedside chair)?: None Help needed to walk in hospital room?: A Little Help needed climbing 3-5 steps with a railing? : A Little 6 Click Score: 22    End of Session   Activity Tolerance: Patient tolerated treatment well;Patient limited by fatigue Patient  left: in bed;with call bell/phone within reach;with bed alarm set Nurse Communication: Mobility status PT Visit Diagnosis: Other abnormalities of gait and mobility (R26.89);Other symptoms and signs involving the nervous system (R29.898)    Time: 1030-1040 PT Time Calculation (min) (ACUTE ONLY): 10 min   Charges:   PT Evaluation $PT Eval Low Complexity: 1 Low          Karri Kallenbach E, PT Acute Rehabilitation Services Pager [phone]  Office [phone]   Karey Suthers D Despina Hidden 06/13/2020, 11:47 AM

## 2020-06-13 NOTE — Evaluation (Signed)
Speech Language Pathology Evaluation Patient Details Name: Kristin [name] MRN: [id number] DOB: 04/22/1986 Today's Date: 06/13/2020 Time: 4332-9518 SLP Time Calculation (min) (ACUTE ONLY): 20 min  Problem List:  Patient Active Problem List   Diagnosis Date Noted  . Stroke-like symptoms 06/12/2020  . Ganglion cyst of volar aspect of right wrist 03/07/2020  . Superficial phlebitis 12/02/2019  . De Quervain's tenosynovitis, right 10/18/2019  . Pes anserinus bursitis of both knees 08/17/2019  . Epidermoid cyst of finger of right hand 03/09/2019  . Subluxation of extensor carpi ulnaris tendon, left, initial encounter 10/21/2018  . Plica syndrome of knee, right 07/03/2018  . SI (sacroiliac) joint dysfunction 03/18/2018  . Piriformis syndrome of left side 12/30/2017  . Nonallopathic lesion of sacral region 07/22/2017  . Nonallopathic lesion of lumbosacral region 07/22/2017  . Nonallopathic lesion of thoracic region 07/22/2017  . 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 Vaginal delivery 04/15/2016  . BMI 40.0-44.9, adult (HCC) 04/14/2016  . Kidney stones--hx  03/24/2016  . History of pregnancy induced hypertension--prior pregnancy 03/24/2016  . Positive GBS test 03/24/2016  . LGA (large for gestational age) fetus--hx with 1st pregnancy, 9+6 03/01/2016  . Allergy to amoxicillin 03/01/2016  . 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 Syncope 08/21/2015  . Hypokalemia 08/21/2015  . Anxiety 08/21/2015   Past Medical History:  Past Medical History:  Diagnosis Date  . Kidney stones   . Knee joint pain   . Pars defect of lumbar spine    L5  . Pregnancy induced hypertension   . Syncope    Past Surgical History:  Past Surgical History:  Procedure Laterality Date  . EXTRACORPOREAL SHOCK WAVE LITHOTRIPSY Left 01/30/2017   Procedure: LEFT EXTRACORPOREAL SHOCK WAVE LITHOTRIPSY (ESWL);  Surgeon: Malen Gauze, MD;  Location: [facility];  Service: Urology;   Laterality: Left;  . KIDNEY STONE SURGERY     HPI:  34yo female admitted 06/12/20 with stroke symptoms, lower abdominal pain, vaginal bleeding, syncope. PMH: depression, anxiety, syncope, carpal tunnel syndrome, morbid obesity, kidney stones, pregnancy induced HTN. MRI = T2/Flair hyperintensities concerning for MS. MRI with contrast - No abnormal intracranial enhancement is demonstrated to suggest active demyelination.   Assessment / Plan / Recommendation Clinical Impression  The Mini-Mental State Examination (MMSE) was administered, given MRI concern for possible MS. Pt scored 28/30, which is [facility] for pt age and education (college degree). Points were lost on serial sevens (4/5) and delayed recall (2/3). Pt was encouraged to use a calendar or other strategies to increase functional recall, and to notify PCP if difficulties increase. No further ST intervention is recommended at this time.    SLP Assessment  SLP Recommendation/Assessment: Patient does not need any further Speech Language Pathology Services    Follow Up Recommendations  None       SLP Evaluation Cognition  Overall Cognitive Status: Within Functional Limits for tasks assessed Arousal/Alertness: Awake/alert Orientation Level: Oriented X4       Comprehension  Auditory Comprehension Overall Auditory Comprehension: Appears within functional limits for tasks assessed    Expression Expression Primary Mode of Expression: Verbal Verbal Expression Overall Verbal Expression: Appears within functional limits for tasks assessed Written Expression Dominant Hand: Right   Oral / Motor  Oral Motor/Sensory Function Overall Oral Motor/Sensory Function: Within functional limits Motor Speech Overall Motor Speech: Appears within functional limits for tasks assessed   GO  [name] B. Murvin Natal, [facility], CCC-SLP Speech Language Pathologist Office: [phone] Pager: [phone]  Leigh Aurora 06/13/2020, 12:52  PM

## 2020-06-13 NOTE — TOC Transition Note (Signed)
Transition of Care [facility]) - CM/SW Discharge Note   Patient Details  Name: Kristin [name] MRN: [id number] Date of Birth: December 13, 1985  Transition of Care [facility] CM/SW Contact:  Kermit Balo, RN Phone Number: 06/13/2020, 3:03 PM   Clinical Narrative:    Pt discharging home with self care. Pt has transportation home.    Final next level of care: Home/Self Care Barriers to Discharge: No Barriers Identified   Patient Goals and CMS Choice        Discharge Placement                       Discharge Plan and Services                                     Social Determinants of Health (SDOH) Interventions     Readmission Risk Interventions No flowsheet data found.

## 2020-06-13 NOTE — Progress Notes (Signed)
Brief Neuro Update:  I briefly saw Ms. Diltz at the bedside. Her symptoms have resolved. She has no aphemia, no weakness on the left side. I reassured her that she does not have a stroke. I discussed MRI findings includingMRI Brain that is concerning for potential MS but I held off on assigning the diagnosis at this time as she does not fit the Mcdonald criteria yet. She does not have any enhancing lesions at this time. rEEG negative for seizure or slowing. Captured 2 non epileptic events. UDS negative. Labs with normal B12, folate, neg HIV. Several pending studies including: RPR, Lyme Ab, Mog Ab, NMO panel, APLA Ab, DSDNA, ANA IFA with reflex, serum ACE.  I discussed that I believe that her symptoms were likely due to stress. She was very receptive to this and did endorse that she worries a lot and was very overwhelmed when she came in. She is open to the idea of going to a therapist to help her cope with her stress. I explained to her that we all deserve to have an unbiased person help [place] process and cope with our emotions. I think she will benefit from this. I also told her to take the rest of the week off to help her with the stress and return to work starting next Monday.  Recs: - Follow up with clinical psychologist - follow up with Neuroimmunologist for further workup for potential MS - Will defer follow up of the pending CNS studies to the outpatient Neurologist including RPR, Lyme Ab, Mog Ab, NMO panel, APLA Ab, DSDNA, ANA IFA with reflex, serum ACE. - Neurology inpatient team will signoff. Please feel free to contact us with any questions or concerns.  Erick Blinks [facility] Pager Number [phone]

## 2020-06-14 LAB — ANTIPHOSPHOLIPID SYNDROME EVAL, BLD
Anticardiolipin IgA: <9 APL U/mL (ref 0–11)
Anticardiolipin IgG: <9 GPL U/mL (ref 0–14)
Anticardiolipin IgM: 83 MPL U/mL — ABNORMAL HIGH (ref 0–12)
DRVVT: 41.2 s (ref 0.0–47.0)
PTT Lupus Anticoagulant: 51.4 s (ref 0.0–51.9)
Phosphatydalserine, IgA: 3 APS IgA (ref 0–20)
Phosphatydalserine, IgG: <10 Units (ref 0–30)
Phosphatydalserine, IgM: <22 MPS IgM (ref <22)

## 2020-06-14 LAB — NEUROMYELITIS OPTICA AUTOAB, IGG: NMO-IgG: <1.5 U/mL (ref 0.0–3.0)

## 2020-06-15 ENCOUNTER — Ambulatory Visit
Admission: RE | Admit: 2020-06-15 | Discharge: 2020-06-15 | Disposition: A | Discharge status: Home / Self Care | Payer: 59 | Source: Ambulatory Visit | Attending: Family Medicine | Admitting: Family Medicine

## 2020-06-15 ENCOUNTER — Other Ambulatory Visit: Payer: Self-pay

## 2020-06-15 ENCOUNTER — Encounter: Payer: Self-pay | Admitting: Family Medicine

## 2020-06-15 DIAGNOSIS — M67431 Ganglion, right wrist: Secondary | ICD-10-CM

## 2020-06-15 DIAGNOSIS — M25531 Pain in right wrist: Secondary | ICD-10-CM

## 2020-06-19 ENCOUNTER — Encounter (HOSPITAL_COMMUNITY): Payer: Self-pay

## 2020-06-19 ENCOUNTER — Other Ambulatory Visit: Payer: Self-pay

## 2020-06-19 DIAGNOSIS — Y9301 Activity, walking, marching and hiking: Secondary | ICD-10-CM | POA: Insufficient documentation

## 2020-06-19 DIAGNOSIS — W01198A Fall on same level from slipping, tripping and stumbling with subsequent striking against other object, initial encounter: Secondary | ICD-10-CM | POA: Diagnosis not present

## 2020-06-19 DIAGNOSIS — R531 Weakness: Secondary | ICD-10-CM | POA: Insufficient documentation

## 2020-06-19 DIAGNOSIS — S0990XA Unspecified injury of head, initial encounter: Secondary | ICD-10-CM | POA: Diagnosis present

## 2020-06-19 DIAGNOSIS — Y92009 Unspecified place in unspecified non-institutional (private) residence as the place of occurrence of the external cause: Secondary | ICD-10-CM | POA: Diagnosis not present

## 2020-06-19 LAB — ANTI-MYELIN ASSOC GLYCOP IGG: Anti-Myelin Assoc Glycop IgG: <1:10 {titer}

## 2020-06-19 NOTE — ED Triage Notes (Signed)
Pt arrives EMS after a fall at home in the bathroom. Denies LOC. C/o pain in the back of head where she hit on floor. Recent dx of MS.

## 2020-06-20 ENCOUNTER — Emergency Department (HOSPITAL_COMMUNITY): Payer: 59

## 2020-06-20 ENCOUNTER — Emergency Department (HOSPITAL_COMMUNITY)
Admission: EM | Admit: 2020-06-20 | Discharge: 2020-06-20 | Disposition: A | Discharge status: Home / Self Care | Payer: 59 | Attending: Emergency Medicine | Admitting: Emergency Medicine

## 2020-06-20 DIAGNOSIS — R531 Weakness: Secondary | ICD-10-CM

## 2020-06-20 DIAGNOSIS — W19XXXA Unspecified fall, initial encounter: Secondary | ICD-10-CM

## 2020-06-20 DIAGNOSIS — S0990XA Unspecified injury of head, initial encounter: Secondary | ICD-10-CM

## 2020-06-20 LAB — CBC WITH DIFFERENTIAL/PLATELET
Abs Immature Granulocytes: 0.03 10*3/uL (ref 0.00–0.07)
Basophils Absolute: 0 10*3/uL (ref 0.0–0.1)
Basophils Relative: 0 %
Eosinophils Absolute: 0 10*3/uL (ref 0.0–0.5)
Eosinophils Relative: 0 %
HCT: 40.4 % (ref 36.0–46.0)
Hemoglobin: 13.2 g/dL (ref 12.0–15.0)
Immature Granulocytes: 0 %
Lymphocytes Relative: 15 %
Lymphs Abs: 1.5 10*3/uL (ref 0.7–4.0)
MCH: 28.2 pg (ref 26.0–34.0)
MCHC: 32.7 g/dL (ref 30.0–36.0)
MCV: 86.3 fL (ref 80.0–100.0)
Monocytes Absolute: 0.6 10*3/uL (ref 0.1–1.0)
Monocytes Relative: 6 %
Neutro Abs: 7.4 10*3/uL (ref 1.7–7.7)
Neutrophils Relative %: 79 %
Platelets: 309 10*3/uL (ref 150–400)
RBC: 4.68 MIL/uL (ref 3.87–5.11)
RDW: 13.6 % (ref 11.5–15.5)
WBC: 9.5 10*3/uL (ref 4.0–10.5)
nRBC: 0 % (ref 0.0–0.2)

## 2020-06-20 LAB — COMPREHENSIVE METABOLIC PANEL
ALT: 16 U/L (ref 0–44)
AST: 17 U/L (ref 15–41)
Albumin: 3.9 g/dL (ref 3.5–5.0)
Alkaline Phosphatase: 64 U/L (ref 38–126)
Anion gap: 10 (ref 5–15)
BUN: 14 mg/dL (ref 6–20)
CO2: 23 mmol/L (ref 22–32)
Calcium: 9.3 mg/dL (ref 8.9–10.3)
Chloride: 104 mmol/L (ref 98–111)
Creatinine, Ser: 0.69 mg/dL (ref 0.44–1.00)
GFR calc Af Amer: >60 mL/min (ref >60)
GFR calc non Af Amer: >60 mL/min (ref >60)
Glucose, Bld: 97 mg/dL (ref 70–99)
Potassium: 3.8 mmol/L (ref 3.5–5.1)
Sodium: 137 mmol/L (ref 135–145)
Total Bilirubin: 0.7 mg/dL (ref 0.3–1.2)
Total Protein: 7.8 g/dL (ref 6.5–8.1)

## 2020-06-20 LAB — I-STAT BETA HCG BLOOD, ED (MC, WL, AP ONLY): I-stat hCG, quantitative: <5 m[IU]/mL (ref <5)

## 2020-06-20 MED ORDER — SODIUM CHLORIDE 0.9 % IV BOLUS
500.0000 mL | Freq: Once | INTRAVENOUS | Status: AC
  Administered 2020-06-20: 500 mL via INTRAVENOUS

## 2020-06-20 NOTE — Discharge Instructions (Addendum)
You were seen in the ED today after fall and left sided weakness. Please make your Neurology appointment as scheduled. Return to the ED with any new or worsening symptoms.

## 2020-06-20 NOTE — ED Notes (Signed)
Patient transported to CT 

## 2020-06-20 NOTE — ED Provider Notes (Signed)
Emergency Department Provider Note   I have reviewed the triage vital signs and the nursing notes.   HISTORY  Chief Complaint Fall   HPI Kristin [name] is a 34 y.o. female with past medical history reviewed below presents to the emergency department after fall at home with difficulty walking.  Patient was in her home yesterday evening when she was walking on the hall, lost her balance, fell backward and struck the back of her head.  She denies loss of consciousness or presyncope symptoms.  She tried to get up and walk but reports falling to the right and could not get up from there.  Her husband came home and helped her get back into bed but ultimately called EMS.  Patient denies any numbness symptoms, chest pain, shortness of breath.  She has unfortunately been waiting in the waiting room for nearly 15 hours by the time of my evaluation.  She states she has not been up and walking since her initial fall.  She is not experiencing any vision disturbance or speech disturbance currently.  She did have an admission in the past week with left-sided weakness.  Initial MRI without contrast showed concern for possible demyelinating lesions in question of MS but contrasted MRI did not show active abnormality.  She has an appointment scheduled with an outpatient neurologist during the first week of October. No radiation of symptoms or modifying factors.    Past Medical History:  Diagnosis Date  . Kidney stones   . Knee joint pain   . Pars defect of lumbar spine    L5  . Pregnancy induced hypertension   . Syncope     Patient Active Problem List   Diagnosis Date Noted  . AMS (altered mental status) 06/13/2020  . Stroke-like symptoms 06/12/2020  . Ganglion cyst of volar aspect of right wrist 03/07/2020  . Superficial phlebitis 12/02/2019  . De Quervain's tenosynovitis, right 10/18/2019  . Pes anserinus bursitis of both knees 08/17/2019  . Epidermoid cyst of finger of right hand 03/09/2019    . Subluxation of extensor carpi ulnaris tendon, left, initial encounter 10/21/2018  . Plica syndrome of knee, right 07/03/2018  . SI (sacroiliac) joint dysfunction 03/18/2018  . Piriformis syndrome of left side 12/30/2017  . Nonallopathic lesion of sacral region 07/22/2017  . Nonallopathic lesion of lumbosacral region 07/22/2017  . Nonallopathic lesion of thoracic region 07/22/2017  . 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 Vaginal delivery 04/15/2016  . BMI 40.0-44.9, adult (HCC) 04/14/2016  . Kidney stones--hx  03/24/2016  . History of pregnancy induced hypertension--prior pregnancy 03/24/2016  . Positive GBS test 03/24/2016  . LGA (large for gestational age) fetus--hx with 1st pregnancy, 9+6 03/01/2016  . Allergy to amoxicillin 03/01/2016  . 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 Syncope 08/21/2015  . Hypokalemia 08/21/2015  . Anxiety 08/21/2015    Past Surgical History:  Procedure Laterality Date  . EXTRACORPOREAL SHOCK WAVE LITHOTRIPSY Left 01/30/2017   Procedure: LEFT EXTRACORPOREAL SHOCK WAVE LITHOTRIPSY (ESWL);  Surgeon: Malen Gauze, MD;  Location: [facility];  Service: Urology;  Laterality: Left;  . KIDNEY STONE SURGERY      Allergies Amoxicillin  Family History  Problem Relation Age of Onset  . CVA Mother   . Hypertension Mother   . Diabetes Mellitus II Mother   . Congestive Heart Failure Mother   . Stroke Mother   . CVA Father   . Hypertension Father   . Diabetes Mellitus II  Father   . Heart disease Father   . Stroke Father   . Stroke Sister     Social History Social History   Tobacco Use  . Smoking status: Never Smoker  . Smokeless tobacco: Never Used  Substance Use Topics  . Alcohol use: No  . Drug use: No    Review of Systems  Constitutional: No fever/chills Eyes: No visual changes. ENT: No sore throat. Cardiovascular: Denies chest pain. Respiratory: Denies shortness of breath. Gastrointestinal: No  abdominal pain.  No nausea, no vomiting.  No diarrhea.  No constipation. Genitourinary: Negative for dysuria. Musculoskeletal: Negative for back pain. Skin: Negative for rash. Neurological: Negative for headaches or numbness. Difficulty walking and left side weakness.   10-point ROS otherwise negative.  ____________________________________________   PHYSICAL EXAM:  VITAL SIGNS: ED Triage Vitals  Enc Vitals Group     BP 06/19/20 1928 (!) 116/113     Pulse Rate 06/19/20 1928 88     Resp 06/19/20 1928 18     Temp 06/19/20 1928 98.8 F (37.1 C)     Temp Source 06/20/20 0743 Oral     SpO2 06/19/20 1922 100 %     Weight 06/19/20 1925 231 lb (104.8 kg)     Height 06/19/20 1925 5\' 5"  (1.651 m)   Constitutional: Alert and oriented. Well appearing and in no acute distress. Eyes: Conjunctivae are normal.  Head: Atraumatic. Nose: No congestion/rhinnorhea. Mouth/Throat: Mucous membranes are moist.  Neck: No stridor.  No cervical spine tenderness to palpation. Cardiovascular: Normal rate, regular rhythm. Good peripheral circulation. Grossly normal heart sounds.   Respiratory: Normal respiratory effort.  No retractions. Lungs CTAB. Gastrointestinal: Soft and nontender. No distention.  Musculoskeletal: No gross deformities of extremities. Neurologic:  Normal speech and language. No facial asymmetry. No sensory deficit. 4+/5 strength in the LUE and LLE compared to 5/5 on the RUE and RLE.  Skin:  Skin is warm, dry and intact. No rash noted.  ____________________________________________   LABS (all labs ordered are listed, but only abnormal results are displayed)  Labs Reviewed  COMPREHENSIVE METABOLIC PANEL  CBC WITH DIFFERENTIAL/PLATELET  I-STAT BETA HCG BLOOD, ED ([facility], [facility], [facility] ONLY)   ____________________________________________  EKG   EKG Interpretation  Date/Time:  Tuesday June 20 2020 11:03:40 EDT Ventricular Rate:  72 PR Interval:    QRS Duration: 85 QT  Interval:  386 QTC Calculation: 423 R Axis:   48 Text Interpretation: Sinus rhythm No STEMI Confirmed by [id number] [phone]) on 06/20/2020 2:58:44 PM       ____________________________________________  RADIOLOGY  CT Head Wo Contrast  Result Date: 06/20/2020 CLINICAL DATA:  Head trauma, moderate/severe. Additional history provided: Fall, patient reports hitting back of head on floor. EXAM: CT HEAD WITHOUT CONTRAST TECHNIQUE: Contiguous axial images were obtained from the base of the skull through the vertex without intravenous contrast. COMPARISON:  Brain MRI 06/12/2020. Brain MRI 06/12/2020. Head CT 06/15/2017. FINDINGS: Brain: Cerebral volume is normal. Known chronic cerebral white matter disease was better demonstrated on the prior MRI of 06/12/2020. There is no acute intracranial hemorrhage. No demarcated cortical infarct. No extra-axial fluid collection. No evidence of intracranial mass. No midline shift. Vascular: No hyperdense vessel. Skull: Normal. Negative for fracture or focal lesion. Sinuses/Orbits: Visualized orbits show no acute finding. No significant paranasal sinus disease or mastoid effusion at the imaged levels. IMPRESSION: No evidence of acute intracranial abnormality. Known chronic cerebral white matter disease was better demonstrated on the prior MRI of 06/12/2020. Electronically Signed  By: Jackey Loge DO   On: 06/20/2020 13:41    ____________________________________________   PROCEDURES  Procedure(s) performed:   Procedures  None  ____________________________________________   INITIAL IMPRESSION / ASSESSMENT AND PLAN / ED COURSE  Pertinent labs & imaging results that were available during my care of the patient were reviewed by me and considered in my medical decision making (see chart for details).   Patient presents to the emergency department with head injury after fall.  No laceration or large external hematoma.  Patient had left-sided weakness during her  recent admission with question of MS.  No evidence of stroke.  Contrast MRI of the brain showed no demyelinating lesions which were suggested on the noncontrast brain MRI.  She has follow-up pending with neurology as an outpatient.  Exam seems consistent with her recent admission as I am appreciating some very slight weakness on the left. Plan for CT head, labs, and reassess.   CT imaging reviewed with no acute findings. White matter changes noted. No active lesions on recent MRI w/ contrast. Discussed findings with Dr. Thomasena Edis. No repeat MRI. He will attempt to establish f/u appointment sooner that 10/6 which the patient's current appointment date. Will ambulate here but patient is overall feeling well. Neuro does not recommend steroid with no active lesions on MRI.   Patient ambulated without  difficulty.____________________________________________  FINAL CLINICAL IMPRESSION(S) / ED DIAGNOSES  Final diagnoses:  Fall, initial encounter  Injury of head, initial encounter  Left-sided weakness    MEDICATIONS GIVEN DURING THIS VISIT:  Medications  sodium chloride 0.9 % bolus 500 mL (0 mLs Intravenous Stopped 06/20/20 1349)    Note:  This document was prepared using Dragon voice recognition software and may include unintentional dictation errors.  Alona Bene, MD, [facility] Emergency Medicine    Daijah Scrivens, Arlyss Repress, MD 06/21/20 [phone]

## 2020-06-20 NOTE — ED Notes (Signed)
Patient ambulated in the hall with steady gait.

## 2020-06-28 ENCOUNTER — Ambulatory Visit: Payer: 59 | Admitting: Neurology

## 2020-06-28 ENCOUNTER — Other Ambulatory Visit: Payer: Self-pay

## 2020-06-28 ENCOUNTER — Encounter: Payer: Self-pay | Admitting: Neurology

## 2020-06-28 VITALS — BP 169/90 | HR 64 | Ht 65.0 in | Wt 231.5 lb

## 2020-06-28 DIAGNOSIS — H539 Unspecified visual disturbance: Secondary | ICD-10-CM | POA: Insufficient documentation

## 2020-06-28 DIAGNOSIS — G4700 Insomnia, unspecified: Secondary | ICD-10-CM | POA: Insufficient documentation

## 2020-06-28 DIAGNOSIS — R9082 White matter disease, unspecified: Secondary | ICD-10-CM | POA: Diagnosis not present

## 2020-06-28 DIAGNOSIS — R208 Other disturbances of skin sensation: Secondary | ICD-10-CM | POA: Diagnosis not present

## 2020-06-28 DIAGNOSIS — R531 Weakness: Secondary | ICD-10-CM

## 2020-06-28 DIAGNOSIS — F418 Other specified anxiety disorders: Secondary | ICD-10-CM | POA: Insufficient documentation

## 2020-06-28 DIAGNOSIS — R9089 Other abnormal findings on diagnostic imaging of central nervous system: Secondary | ICD-10-CM | POA: Insufficient documentation

## 2020-06-28 HISTORY — DX: Insomnia, unspecified: G47.00

## 2020-06-28 HISTORY — DX: Unspecified visual disturbance: H53.9

## 2020-06-28 HISTORY — DX: Weakness: R53.1

## 2020-06-28 MED ORDER — TRAZODONE HCL 100 MG PO TABS: 100.0000 mg | ORAL_TABLET | Freq: Every day | ORAL | 5 refills | Status: DC

## 2020-06-28 MED ORDER — VITAMIN D (ERGOCALCIFEROL) 1.25 MG (50000 UNIT) PO CAPS: 50000.0000 [IU] | ORAL_CAPSULE | ORAL | 2 refills | Status: DC

## 2020-06-28 NOTE — Progress Notes (Signed)
[facility]  PATIENT: Kristin [name] DOB: [birth year]  REFERRING DOCTOR OR PCP:  Bonnielee Haff, MD SOURCE: Patient, notes from recent emergency room visits and hospitalizations, imaging and laboratory reports,MRI images personally reviewed.  _________________________________   HISTORICAL  CHIEF COMPLAINT:  Chief Complaint  Patient presents with  . New Patient (Initial Visit)    RM 13, alone. Internal referral from Bonnielee Haff, MD at [facility]. Was at [facility] 06/12/20-06/13/20. Also went to ER 06/20/20.   . Eye Problem    Having intermittent blurry vision. Occurs more in the morning. When she tries to read something, she notices it more. Last eye exam about 4 months ago. Sees Lens Craft at [facility]. She wears glasses. Vision test today: R 20/30, L 20/50, B 20/30  . Fatigue    Having left sided weakness. Worse when she has been on her feet for awhile.   . Memory Loss    Having more difficulty with short term memory. Having to write a lot of things down.     HISTORY OF PRESENT ILLNESS:  I had the pleasure of seeing patient, Kristin [name], at [facility] for neurologic consultation regarding her abnormal brain MRI and concerned about the possibility of multiple sclerosis.  She is a 34 year old woman who noted shortness of breath and dizziness.   Balance was off and she was stumbling.   She felt she was dragging the left foot.   She had two falls 06/01/20 and 06/20/20.    She went to the ED after the first fall.  As she was baseline during the evaluation CT scan was not performed.  On 06/12/2020, she presented to the emergency room with abdominal pain or vaginal bleeding.  While in the emergency room, according to the notes, she became unresponsive with her eyes rolling up.  The episode lasted 5 minutes and she was weak on the left side afterwards..  No generalized tonic-clonic activity occurred.  She was seen by neurology and an MRI of the brain was  ordered.  It showed some T2 hyperintense foci potentially concerning for MS.Marland Kitchen   She was admitted and additional MRI's were performed showing some white matter foci.She also noted blurry.      She did not receive steroids.  Potassium was borderline low and white blood cells were minimally elevated. other tests including, HIV, SARS-CoV-2 RT-PCR, B12 and TSH were normal or negative.  She was admitted.  She had an EEG performed which was normal.  There were 2 episodes of left arm twitching and one episode of right arm twitching and staring off that were not associated with EEG changes and were felt to be nonepileptic.  MRI of the spine was performed.  The spinal cord was read as a possible focus at T4.  I reviewed the images and feel it is more likely to be artifact.  She had arthralgias earlier this year.  Blood work in April 2021 including ANA, ACE, rheumatoid factor,  TSH, uric acid, CCP, C-reactive peptide PTH was negative.  Vitamin D was low.  Ferritin was low normal.  ESR was borderline (21)  Currently, she notes balance is still off.   She continues note shortness of breath and a tight sensation in her chest.    She feels the left side is mildly weak and she notes numbness in the left arm and leg.    Bladder function is fine.    She feels the left eye vision is blurry.  Color vision is symmetric.  She is sleeping poorly.   She reports she is getting 3 hours sleep many nights.   She feels short term memory is reduced.   She notes reduced focus and processing, especially with conversation.     She notes some anxiety.   She has mild depression having recently lost her mother and grandmother.     According to the note from 06/12/2020, the husband told the medical hospitalist that the patient had syncopal episodes in the past and had an EEG that came back negative.  He reported that when she is anxious she does not talk.  Interestingly, when she was asked a question she type the response on her phone rather  than speak.  She has occasional headaches but migraine aura.    She denies HTN, smoking or diabetes mellitus.     Her mother had 2 strokes.         Data and Imaging review: I personally reviewed MRIs as follows: MRI of the head 06/12/2020:  There are scattered T2/FLAIR hyperintense foci in the hemispheres including a couple in the periventricular white matter.  Most foci are nonspecific and in the deep white matter.  None of the foci enhance or appear to be acute.  No enhancing lesions.  MRI of the cervical spine 06/12/2020: The spinal cord appears normal.  There is a disc protrusion at C5-C6 causing mild spinal stenosis but no nerve root compression.  MRI of the thoracic spine 06/12/2020: The official interpretation is a possible focus adjacent to T4.  I believe that this finding (only seen on sagittal T2-weighted images and not on sagittal STIR or axial T2 or GRE views) represents artifact.  The thoracic spine is otherwise normal.  CT 06/20/2020: Normal no acute findings.  Laboratory data 12/29/2019 through 06/20/2020:  Blood work in April 2021 including ANA, ACE, rheumatoid factor,  TSH, uric acid, CCP, C-reactive peptide PTH was negative.  Vitamin D was low.  Ferritin was low normal.  ESR was borderline (21)  REVIEW OF SYSTEMS: Constitutional: No fevers, chills, sweats, or change in appetite.  She has poor sleep. Eyes: No visual changes, double vision, eye pain Ear, nose and throat: No hearing loss, ear pain, nasal congestion, sore throat Cardiovascular: No chest pain, palpitations Respiratory:Occasional shortness of breath at rest or with exertion.   No wheezes GastrointestinaI: No nausea, vomiting, diarrhea, abdominal pain, fecal incontinence Genitourinary: No dysuria, urinary retention or frequency.  No nocturia. Musculoskeletal: No neck pain, back pain Integumentary: No rash, pruritus, skin lesions Neurological: as above Psychiatric: She reports anxiety and some  depression. Endocrine: No palpitations, diaphoresis, change in appetite, change in weigh or increased thirst Hematologic/Lymphatic: No anemia, purpura, petechiae. Allergic/Immunologic: No itchy/runny eyes, nasal congestion, recent allergic reactions, rashes  ALLERGIES: Allergies  Allergen Reactions  . Amoxicillin Hives    Has patient had a PCN reaction causing immediate rash, facial/tongue/throat swelling, SOB or lightheadedness with hypotension: no Has patient had a PCN reaction causing severe rash involving mucus membranes or skin necrosis: No Has patient had a PCN reaction that required hospitalization No Has patient had a PCN reaction occurring within the last 10 years: No If all of the above answers are "NO", then may proceed with Cephalosporin use.    HOME MEDICATIONS: No current outpatient medications on file.  PAST MEDICAL HISTORY: Past Medical History:  Diagnosis Date  . Kidney stones   . Knee joint pain   . Pars defect of lumbar spine    L5  .  Pregnancy induced hypertension   . Syncope     PAST SURGICAL HISTORY: Past Surgical History:  Procedure Laterality Date  . EXTRACORPOREAL SHOCK WAVE LITHOTRIPSY Left 01/30/2017   Procedure: LEFT EXTRACORPOREAL SHOCK WAVE LITHOTRIPSY (ESWL);  Surgeon: Cleon Gustin, MD;  Location: [facility];  Service: Urology;  Laterality: Left;  . KIDNEY STONE SURGERY      FAMILY HISTORY: Family History  Problem Relation Age of Onset  . CVA Mother   . Hypertension Mother   . Diabetes Mellitus II Mother   . Congestive Heart Failure Mother   . Stroke Mother   . CVA Father   . Hypertension Father   . Diabetes Mellitus II Father   . Heart disease Father   . Stroke Father   . Stroke Sister     SOCIAL HISTORY:  Social History   Socioeconomic History  . Marital status: Married    Spouse name: Marlon Pel  . Number of children: 2  . Years of education: BA  . Highest education level: Not on file  Occupational History  .  Occupation: [employer]  Tobacco Use  . Smoking status: Never Smoker  . Smokeless tobacco: Never Used  Substance and Sexual Activity  . Alcohol use: Yes    Comment: rarely  . Drug use: No  . Sexual activity: Not on file  Other Topics Concern  . Not on file  Social History Narrative   Right handed   No caffeine use   Lives with husband and two kids   Social Determinants of Health   Financial Resource Strain:   . Difficulty of Paying Living Expenses: Not on file  Food Insecurity:   . Worried About Charity fundraiser in the Last Year: Not on file  . Ran Out of Food in the Last Year: Not on file  Transportation Needs:   . Lack of Transportation (Medical): Not on file  . Lack of Transportation (Non-Medical): Not on file  Physical Activity:   . Days of Exercise per Week: Not on file  . Minutes of Exercise per Session: Not on file  Stress:   . Feeling of Stress : Not on file  Social Connections:   . Frequency of Communication with Friends and Family: Not on file  . Frequency of Social Gatherings with Friends and Family: Not on file  . Attends Religious Services: Not on file  . Active Member of Clubs or Organizations: Not on file  . Attends Archivist Meetings: Not on file  . Marital Status: Not on file  Intimate Partner Violence:   . Fear of Current or Ex-Partner: Not on file  . Emotionally Abused: Not on file  . Physically Abused: Not on file  . Sexually Abused: Not on file     PHYSICAL EXAM  Vitals:   06/28/20 1320  BP: (!) 169/90  Pulse: 64  Weight: 231 lb 8 oz (105 kg)  Height: 5' 5"  (1.651 m)    Body mass index is 38.52 kg/m.    Hearing Screening   125Hz  250Hz  500Hz  1000Hz  2000Hz  3000Hz  4000Hz  6000Hz  8000Hz   Right ear:           Left ear:             Visual Acuity Screening   Right eye Left eye Both eyes  Without correction:     With correction: 20/30 20/50 20/30     General: The patient is well-developed and  well-nourished and in no acute  distress  HEENT:  Head is [place]/AT.  Sclera are anicteric.  Funduscopic exam shows normal optic discs and retinal vessels.   Color vision was symmetric  Neck: No carotid bruits are noted.  The neck is nontender.  Cardiovascular: The heart has a regular rate and rhythm with a normal S1 and S2. There were no murmurs, gallops or rubs.    Skin: Extremities are without rash or  edema.  Musculoskeletal:  Back is nontender  Neurologic Exam  Mental status: The patient is alert and oriented x 3 at the time of the examination. The patient has apparent normal recent and remote memory, with an apparently normal attention span and concentration ability.   Speech is normal.  Cranial nerves: Extraocular movements are full. Pupils are equal, round, and reactive to light and accomodation.   Facial symmetry is present. There is good facial sensation to temperature bilaterally.Facial strength is normal.  Trapezius and sternocleidomastoid strength is normal. No dysarthria is noted.  The tongue is midline, and the patient has symmetric elevation of the soft palate. No obvious hearing deficits are noted.  Motor:  Muscle bulk is normal.   Tone is normal. Strength is variable in left leg, possibly due to functional findings.  Strength was 5 / 5 elsewhere.  Of note, on direct testing strength was 3/5 at the left ankle but while walking on her way out of the office there was no foot drop.  She did not brace the right leg while the left strength was tested laying down.  Sensory: Sensory testing is intact to pinprick, soft touch and vibration sensation in the arms.  She reported reduced vibration sensation in the left leg relative to the right.  Coordination: Cerebellar testing reveals good finger-nose-finger and heel-to-shin bilaterally.  Gait and station: Station is normal.   Gait is normal. Tandem gait is mildly wide. Romberg is negative.   Reflexes: Deep tendon reflexes are symmetric  and normal bilaterally.   Plantar responses are flexor.     DIAGNOSTIC DATA (LABS, IMAGING, TESTING) - I reviewed patient records, labs, notes, testing and imaging myself where available.  Lab Results  Component Value Date   WBC 9.5 06/20/2020   HGB 13.2 06/20/2020   HCT 40.4 06/20/2020   MCV 86.3 06/20/2020   PLT 309 06/20/2020      Component Value Date/Time   NA 137 06/20/2020 1137   K 3.8 06/20/2020 1137   CL 104 06/20/2020 1137   CO2 23 06/20/2020 1137   GLUCOSE 97 06/20/2020 1137   BUN 14 06/20/2020 1137   CREATININE 0.69 06/20/2020 1137   CALCIUM 9.3 06/20/2020 1137   PROT 7.8 06/20/2020 1137   ALBUMIN 3.9 06/20/2020 1137   AST 17 06/20/2020 1137   ALT 16 06/20/2020 1137   ALKPHOS 64 06/20/2020 1137   BILITOT 0.7 06/20/2020 1137   GFRNONAA >60 06/20/2020 1137   GFRAA >60 06/20/2020 1137   Lab Results  Component Value Date   CHOL 131 06/13/2020   HDL 34 (L) 06/13/2020   LDLCALC 79 06/13/2020   TRIG 90 06/13/2020   CHOLHDL 3.9 06/13/2020   Lab Results  Component Value Date   HGBA1C 5.0 06/13/2020   Lab Results  Component Value Date   ZLDJTTSV77 939 06/12/2020   Lab Results  Component Value Date   TSH 0.896 06/12/2020       ASSESSMENT AND PLAN  White matter abnormality on MRI of brain - Plan: DG FL GUIDED LUMBAR PUNCTURE  Dysesthesia  Left-sided weakness  Vision disturbance   In summary, Ms. Stineman is a 34 year old woman reporting numbness and weakness in the left side of her body who had an abnormal brain MRI showing nonspecific foci in the hemispheres with a couple in the periventricular white matter.  She had functional weakness in the left leg.  The spell in the hospital may have represented a nonepileptic seizure.  She does have anxiety.  The MRI of the cervical and thoracic spinal cord did not show any lesions.  The spot at T4 seen on 1 sequence is likely artifact.  I discussed with her that although MS is possible, the MRI of the brain  is nonspecific.  Additional information would be required.  I will have her get a lumbar puncture so the CSF can be checked for oligoclonal bands.  If present, I would increase the possibility that she has MS.  If negative, I would still want to check a follow-up MRI in the future though the likelihood that she has MS would be lower.  Vitamin D was low earlier in the year and I will place her on 50,000 units weekly.  Trazodone was prescribed for her insomnia that is both sleep onset and sleep maintenance.  She will return to see me in 3 months or sooner if there are new or worsening neurologic symptoms.  We will let her know the results of the lumbar puncture as the lab work comes in.  Thank you for asking me to see Ms. Heishman.  Please let me know if I can be of further assistance with her or other patients in the future.  95-minute office visit with the majority of the time spent face-to-face for history and physical, discussion/counseling and decision-making.  Additional time with extensive record review, imaging review and documentation.    Laurens Matheny A. Felecia Shelling, MD, [facility] 18/05/8420, 0:31 PM Certified in Neurology, Clinical Neurophysiology, Sleep Medicine and Neuroimaging  [facility] [address] [place], [place] [address] [phone]

## 2020-06-30 ENCOUNTER — Other Ambulatory Visit: Payer: Self-pay | Admitting: Neurology

## 2020-06-30 ENCOUNTER — Other Ambulatory Visit: Payer: Self-pay

## 2020-06-30 DIAGNOSIS — R202 Paresthesia of skin: Secondary | ICD-10-CM

## 2020-07-03 ENCOUNTER — Ambulatory Visit: Payer: 59 | Admitting: Family Medicine

## 2020-07-03 ENCOUNTER — Other Ambulatory Visit: Payer: Self-pay

## 2020-07-03 ENCOUNTER — Encounter: Payer: Self-pay | Admitting: Family Medicine

## 2020-07-03 VITALS — BP 140/90 | HR 77 | Ht 65.0 in | Wt 228.0 lb

## 2020-07-03 DIAGNOSIS — R9082 White matter disease, unspecified: Secondary | ICD-10-CM

## 2020-07-03 DIAGNOSIS — M4306 Spondylolysis, lumbar region: Secondary | ICD-10-CM

## 2020-07-03 DIAGNOSIS — M999 Biomechanical lesion, unspecified: Secondary | ICD-10-CM | POA: Diagnosis not present

## 2020-07-03 NOTE — Patient Instructions (Signed)
Good to see you See me again in 4 weeks 

## 2020-07-03 NOTE — Assessment & Plan Note (Signed)
Continued discomfort and pain.  Patient also has significant amount of stress recently.  Patient has a still work-up for multiple sclerosis including a spinal tap in the near future.  Patient is to increase activity slowly.  Patient does respond well to manipulation.  No change in medications at this time.  Patient will follow up with me again 4 weeks

## 2020-07-03 NOTE — Progress Notes (Signed)
[name] [address] [place] [address] Phone: [phone] Subjective:   I Kristin [name] am serving as a Neurosurgeon for Dr. Antoine Primas.  This visit occurred during the SARS-CoV-2 public health emergency.  Safety protocols were in place, including screening questions prior to the visit, additional usage of staff PPE, and extensive cleaning of exam room while observing appropriate contact time as indicated for disinfecting solutions.   I'm seeing this patient by the request  of:  Patient, No Pcp Per  CC: Low back pain follow-up  [id number]  Kristin [name] is a 34 y.o. female coming in with complaint of back and neck pain. OMT 05/25/2020. Patient states she is doing ok today. Sees the surgeon tomorrow about her wrist.   Medications patient has been prescribed: Vitamin D and trazodone  Taking: Yes         Reviewed prior external information including notes and imaging from previsou exam, outside providers and external EMR if available.   As well as notes that were available from care everywhere and other healthcare systems.  Past medical history, social, surgical and family history all reviewed in electronic medical record.  No pertanent information unless stated regarding to the chief complaint.   Past Medical History:  Diagnosis Date  . Kidney stones   . Knee joint pain   . Pars defect of lumbar spine    L5  . Pregnancy induced hypertension   . Syncope     Allergies  Allergen Reactions  . Amoxicillin Hives    Has patient had a PCN reaction causing immediate rash, facial/tongue/throat swelling, SOB or lightheadedness with hypotension: no Has patient had a PCN reaction causing severe rash involving mucus membranes or skin necrosis: No Has patient had a PCN reaction that required hospitalization No Has patient had a PCN reaction occurring within the last 10 years: No If all of the above answers are "NO", then may proceed with  Cephalosporin use.     Review of Systems:  No headache, visual changes, nausea, vomiting, diarrhea, constipation, dizziness, abdominal pain, skin rash, fevers, chills, night sweats, weight loss, swollen lymph nodes, body aches, joint swelling, chest pain, shortness of breath, mood changes. POSITIVE muscle aches  Objective  Blood pressure 140/90, pulse 77, height 5\' 5"  (1.651 m), weight 228 lb (103.4 kg), last menstrual period 06/13/2020, SpO2 98 %, currently breastfeeding.   General: No apparent distress alert and oriented x3 mood and affect normal, dressed appropriately.  HEENT: Pupils equal, extraocular movements intact  Respiratory: Patient's speak in full sentences and does not appear short of breath  Cardiovascular: No lower extremity edema, non tender, no erythema  Neuro: Cranial nerves II through XII are intact, neurovascularly intact in all extremities with 2+ DTRs and 2+ pulses.  Gait normal with good balance and coordination.  MSK:  Non tender with full range of motion and good stability and symmetric strength and tone of shoulders, elbows, wrist, hip, knee and ankles bilaterally.  Back -low back exam shows the patient has some tenderness to palpation in the paraspinal musculature of the lumbar spine mostly around L4-L5.  No spinous process tenderness.  Tightness with 06/15/2020 left greater than right.  Osteopathic findings  C6 flexed rotated and side bent left T3 extended rotated and side bent right inhaled rib T8 extended rotated and side bent left L2 flexed rotated and side bent right Sacrum left on left       Assessment and Plan:  Pars defect of  lumbar spine Continued discomfort and pain.  Patient also has significant amount of stress recently.  Patient has a still work-up for multiple sclerosis including a spinal tap in the near future.  Patient is to increase activity slowly.  Patient does respond well to manipulation.  No change in medications at this time.  Patient  will follow up with me again 4 weeks  White matter abnormality on MRI of brain Following up with neurology    Nonallopathic problems  Decision today to treat with OMT was based on Physical Exam  After verbal consent patient was treated with HVLA, ME, FPR techniques in thoracic, lumbar, and sacral  areas  Patient tolerated the procedure well with improvement in symptoms  Patient given exercises, stretches and lifestyle modifications  See medications in patient instructions if given  Patient will follow up in 4-8 weeks      The above documentation has been reviewed and is accurate and complete [name], DO       Note: This dictation was prepared with Dragon dictation along with smaller phrase technology. Any transcriptional errors that result from this process are unintentional.

## 2020-07-03 NOTE — Assessment & Plan Note (Signed)
Following up with neurology. 

## 2020-07-04 DIAGNOSIS — M25531 Pain in right wrist: Secondary | ICD-10-CM

## 2020-07-04 HISTORY — DX: Pain in right wrist: M25.531

## 2020-07-05 ENCOUNTER — Encounter: Payer: 59 | Admitting: Neurology

## 2020-07-05 DIAGNOSIS — M67431 Ganglion, right wrist: Secondary | ICD-10-CM | POA: Insufficient documentation

## 2020-07-05 DIAGNOSIS — G5603 Carpal tunnel syndrome, bilateral upper limbs: Secondary | ICD-10-CM | POA: Insufficient documentation

## 2020-07-05 DIAGNOSIS — G5601 Carpal tunnel syndrome, right upper limb: Secondary | ICD-10-CM | POA: Insufficient documentation

## 2020-07-05 HISTORY — DX: Ganglion, right wrist: M67.431

## 2020-07-11 ENCOUNTER — Inpatient Hospital Stay: Admission: RE | Admit: 2020-07-11 | Payer: 59 | Source: Ambulatory Visit

## 2020-07-20 ENCOUNTER — Other Ambulatory Visit: Payer: Self-pay | Admitting: Neurology

## 2020-08-02 NOTE — Progress Notes (Deleted)
[name] [address] [place] [address] Phone: [phone] Subjective:    I'm seeing this patient by the request  of:  Patient, No Pcp Per  CC:   [id number]  Kristin [name] is a 34 y.o. female coming in with complaint of back and neck pain. OMT 07/03/2020. Patient states   Medications patient has been prescribed: None  Taking:         Reviewed prior external information including notes and imaging from previsou exam, outside providers and external EMR if available.   As well as notes that were available from care everywhere and other healthcare systems.  Past medical history, social, surgical and family history all reviewed in electronic medical record.  No pertanent information unless stated regarding to the chief complaint.   Past Medical History:  Diagnosis Date  . Kidney stones   . Knee joint pain   . Pars defect of lumbar spine    L5  . Pregnancy induced hypertension   . Syncope     Allergies  Allergen Reactions  . Amoxicillin Hives    Has patient had a PCN reaction causing immediate rash, facial/tongue/throat swelling, SOB or lightheadedness with hypotension: no Has patient had a PCN reaction causing severe rash involving mucus membranes or skin necrosis: No Has patient had a PCN reaction that required hospitalization No Has patient had a PCN reaction occurring within the last 10 years: No If all of the above answers are "NO", then may proceed with Cephalosporin use.     Review of Systems:  No headache, visual changes, nausea, vomiting, diarrhea, constipation, dizziness, abdominal pain, skin rash, fevers, chills, night sweats, weight loss, swollen lymph nodes, body aches, joint swelling, chest pain, shortness of breath, mood changes. POSITIVE muscle aches  Objective  currently breastfeeding.   General: No apparent distress alert and oriented x3 mood and affect normal, dressed appropriately.  HEENT: Pupils  equal, extraocular movements intact  Respiratory: Patient's speak in full sentences and does not appear short of breath  Cardiovascular: No lower extremity edema, non tender, no erythema  Neuro: Cranial nerves II through XII are intact, neurovascularly intact in all extremities with 2+ DTRs and 2+ pulses.  Gait normal with good balance and coordination.  MSK:  Non tender with full range of motion and good stability and symmetric strength and tone of shoulders, elbows, wrist, hip, knee and ankles bilaterally.  Back - Normal skin, Spine with normal alignment and no deformity.  No tenderness to vertebral process palpation.  Paraspinous muscles are not tender and without spasm.   Range of motion is full at neck and lumbar sacral regions  Osteopathic findings  C2 flexed rotated and side bent right C6 flexed rotated and side bent left T3 extended rotated and side bent right inhaled rib T9 extended rotated and side bent left L2 flexed rotated and side bent right Sacrum right on right       Assessment and Plan:    Nonallopathic problems  Decision today to treat with OMT was based on Physical Exam  After verbal consent patient was treated with HVLA, ME, FPR techniques in cervical, rib, thoracic, lumbar, and sacral  areas  Patient tolerated the procedure well with improvement in symptoms  Patient given exercises, stretches and lifestyle modifications  See medications in patient instructions if given  Patient will follow up in 4-8 weeks      The above documentation has been reviewed and is accurate and complete Wilford Grist  Note: This dictation was prepared with Dragon dictation along with smaller phrase technology. Any transcriptional errors that result from this process are unintentional.

## 2020-08-03 ENCOUNTER — Ambulatory Visit: Payer: 59 | Admitting: Family Medicine

## 2020-08-10 ENCOUNTER — Encounter: Payer: Self-pay | Admitting: Family Medicine

## 2020-08-10 ENCOUNTER — Other Ambulatory Visit: Payer: Self-pay

## 2020-08-10 ENCOUNTER — Ambulatory Visit: Payer: 59 | Admitting: Family Medicine

## 2020-08-10 VITALS — BP 128/94 | HR 65 | Ht 65.0 in | Wt 222.0 lb

## 2020-08-10 DIAGNOSIS — M4306 Spondylolysis, lumbar region: Secondary | ICD-10-CM | POA: Diagnosis not present

## 2020-08-10 DIAGNOSIS — M999 Biomechanical lesion, unspecified: Secondary | ICD-10-CM | POA: Diagnosis not present

## 2020-08-10 DIAGNOSIS — M67431 Ganglion, right wrist: Secondary | ICD-10-CM

## 2020-08-10 DIAGNOSIS — M533 Sacrococcygeal disorders, not elsewhere classified: Secondary | ICD-10-CM | POA: Diagnosis not present

## 2020-08-10 NOTE — Progress Notes (Signed)
[name] [address] [place] [address] Phone: [phone] Subjective:   [name], am serving as a scribe for Dr. Antoine Primas. This visit occurred during the SARS-CoV-2 public health emergency.  Safety protocols were in place, including screening questions prior to the visit, additional usage of staff PPE, and extensive cleaning of exam room while observing appropriate contact time as indicated for disinfecting solutions.   I'm seeing this patient by the request  of:  Patient, No Pcp Per  CC: Neck and back pain follow-up  [id number]  Aleia CYNAI SKEENS is a 34 y.o. female coming in with complaint of back and neck pain. OMT 07/03/2020. Patient states that she was told that she needs surgery on her wrist. Patient states that her back is bothering her on lower left side.  Patient states that it is a dull, throbbing aching sensation.  No radiation down the leg.  Still having significant increase stress at home as well as at work.  Patient feels though that she has a good support.           Reviewed prior external information including notes and imaging from previsou exam, outside providers and external EMR if available.   As well as notes that were available from care everywhere and other healthcare systems.  Past medical history, social, surgical and family history all reviewed in electronic medical record.  No pertanent information unless stated regarding to the chief complaint.   Past Medical History:  Diagnosis Date  . Kidney stones   . Knee joint pain   . Pars defect of lumbar spine    L5  . Pregnancy induced hypertension   . Syncope     Allergies  Allergen Reactions  . Amoxicillin Hives    Has patient had a PCN reaction causing immediate rash, facial/tongue/throat swelling, SOB or lightheadedness with hypotension: no Has patient had a PCN reaction causing severe rash involving mucus membranes or skin necrosis: No Has  patient had a PCN reaction that required hospitalization No Has patient had a PCN reaction occurring within the last 10 years: No If all of the above answers are "NO", then may proceed with Cephalosporin use.     Review of Systems:  No headache, visual changes, nausea, vomiting, diarrhea, constipation, dizziness, abdominal pain, skin rash, fevers, chills, night sweats, weight loss, swollen lymph nodes,joint swelling, chest pain, shortness of breath, mood changes. POSITIVE muscle aches, body aches  Objective  Blood pressure (!) 128/94, pulse 65, height 5\' 5"  (1.651 m), weight 222 lb (100.7 kg), SpO2 98 %, currently breastfeeding.   General: No apparent distress alert and oriented x3 mood and affect normal, dressed appropriately.  Patient is mildly anxious HEENT: Pupils equal, extraocular movements intact  Respiratory: Patient's speak in full sentences and does not appear short of breath  Cardiovascular: No lower extremity edema, non tender, no erythema  Neuro: Cranial nerves II through XII are intact, neurovascularly intact in all extremities with 2+ DTRs and 2+ pulses.  Gait normal with good balance and coordination.  MSK:  Non tender with full range of motion and good stability and symmetric strength and tone of shoulders, elbows, wrist, hip, knee and ankles bilaterally.  Back - Normal skin, Spine with normal alignment and no deformity.  No tenderness to vertebral process palpation.  Paraspinous muscles are not tender and without spasm.   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  Patient has decreased a lot of her lifting medications but does have trazodone for sleep from time to time.  Patient has had some excessive amount of stress recently that  is likely contributing as well.  Discussed home exercises and icing regimen.  Follow-up again in 4 to 8 weeks  Pars defect of lumbar spine No known pars defect.  Could be mild exacerbation as well.  We discussed different medications the patient wanted to hold at this time.  Follow-up again in 4 to 8 weeks.    Nonallopathic problems  Decision today to treat with OMT was based on Physical Exam  After verbal consent patient was treated with HVLA, ME, FPR techniques in thoracic, lumbar, and sacral  areas  Patient tolerated the procedure well with improvement in symptoms  Patient given exercises, stretches and lifestyle modifications  See medications in patient instructions if given  Patient will follow up in 4-8 weeks      The above documentation has been reviewed and is accurate and complete [name], DO       Note: This dictation was prepared with Dragon dictation along with smaller phrase technology. Any transcriptional errors that result from this process are unintentional.

## 2020-08-10 NOTE — Assessment & Plan Note (Signed)
Awaiting surgery

## 2020-08-10 NOTE — Assessment & Plan Note (Signed)
No known pars defect.  Could be mild exacerbation as well.  We discussed different medications the patient wanted to hold at this time.  Follow-up again in 4 to 8 weeks.

## 2020-08-10 NOTE — Assessment & Plan Note (Signed)
Chronic problem with worsening aspect.  Patient has decreased a lot of her lifting medications but does have trazodone for sleep from time to time.  Patient has had some excessive amount of stress recently that is likely contributing as well.  Discussed home exercises and icing regimen.  Follow-up again in 4 to 8 weeks

## 2020-08-10 NOTE — Patient Instructions (Signed)
See me again in 5-6 weeks 

## 2020-09-20 NOTE — Progress Notes (Signed)
[name] [address] [place] [address] Phone: [phone] Subjective:   I [name] am serving as a Neurosurgeon for Dr. Antoine Primas.  This visit occurred during the SARS-CoV-2 public health emergency.  Safety protocols were in place, including screening questions prior to the visit, additional usage of staff PPE, and extensive cleaning of exam room while observing appropriate contact time as indicated for disinfecting solutions.   I'm seeing this patient by the request  of:  Patient, No Pcp Per  CC: Neck and back pain follow-up  [id number]  Brittny KARRIE FLUELLEN is a 34 y.o. female coming in with complaint of back and neck pain. OMT 08/10/2020. Patient states she would like her wrist. States she has been moving so her neck is irritated.  Patient has had a known ganglion cyst of this wrist previously.  Starting to have increasing pain again.  Was referred for surgery but is unable to do it at this point secondary to being out of work for too long.  Would like aspiration done again today if possible.  Back exam stable overall.  Has been moving boxes.  Because of this has had some increasing discomfort and pain.  Medications patient has been prescribed: Vitamin D  Taking: Yes         Reviewed prior external information including notes and imaging from previsou exam, outside providers and external EMR if available.   As well as notes that were available from care everywhere and other healthcare systems.  Past medical history, social, surgical and family history all reviewed in electronic medical record.  No pertanent information unless stated regarding to the chief complaint.   Past Medical History:  Diagnosis Date  . Kidney stones   . Knee joint pain   . Pars defect of lumbar spine    L5  . Pregnancy induced hypertension   . Syncope     Allergies  Allergen Reactions  . Amoxicillin Hives    Has patient had a PCN reaction causing  immediate rash, facial/tongue/throat swelling, SOB or lightheadedness with hypotension: no Has patient had a PCN reaction causing severe rash involving mucus membranes or skin necrosis: No Has patient had a PCN reaction that required hospitalization No Has patient had a PCN reaction occurring within the last 10 years: No If all of the above answers are "NO", then may proceed with Cephalosporin use.     Review of Systems:  No headache, visual changes, nausea, vomiting, diarrhea, constipation, dizziness, abdominal pain, skin rash, fevers, chills, night sweats, weight loss, swollen lymph nodes, body aches, joint swelling, chest pain, shortness of breath, mood changes. POSITIVE muscle aches  Objective  currently breastfeeding.   General: No apparent distress alert and oriented x3 mood and affect normal, dressed appropriately.  HEENT: Pupils equal, extraocular movements intact  Respiratory: Patient's speak in full sentences and does not appear short of breath  Cardiovascular: No lower extremity edema, non tender, no erythema  Neuro: Cranial nerves II through XII are intact, neurovascularly intact in all extremities with 2+ DTRs and 2+ pulses.  Gait normal with good balance and coordination.  MSK: Right wrist exam shows the patient does have some cyst formation again noted on the volar aspect more on the radial side.  Larger than usual.  Tender to palpation.  Is fluctuant.  No erythema of the skin. Back -worsening pain with any extension of the back.  Tenderness to palpation in the paraspinal musculature mostly L4-L5 right greater than left  and some over the right sacroiliac joint.  Osteopathic findings  T6 extended rotated and side bent right inhaled ribt L2 flexed rotated and side bent right L4 flexed rotated and side bent right Sacrum right on right  Procedure: Real-time Ultrasound Guided Injection of right ganglion cyst Device: GE Logiq Q7 Ultrasound guided injection is preferred based  studies that show increased duration, increased effect, greater accuracy, decreased procedural pain, increased response rate, and decreased cost with ultrasound guided versus blind injection.  Verbal informed consent obtained.  Time-out conducted.  Noted no overlying erythema, induration, or other signs of local infection.  Skin prepped in a sterile fashion.  Local anesthesia: Topical Ethyl chloride.  With sterile technique and under real time ultrasound guidance: With an 18-gauge 1-1/2 inch needle injected with 0.5 cc of 0.5% Marcaine then aspirated and significant amount of gel-like material.  Patient then injected 0.5 cc of Kenalog 40 mg/mL. Completed without difficulty  Pain immediately improved suggesting accurate placement of the medication.  Advised to call if fevers/chills, erythema, induration, drainage, or persistent bleeding.  Impression: Technically successful ultrasound guided injection.     Assessment and Plan:  Ganglion cyst of volar aspect of right wrist Repeat aspiration done again today.  Patient was going to have surgical intervention but unfortunately would have to be out of work for 3 months.  Patient feels like that is not possible especially with her going through separation and a recent move.  Patient's responded well to the aspiration today and I am hoping that we will have a longer improvement than previously.  Discussed avoiding repetitive activity where possible and follow-up again in 4 to 8 weeks  Pars defect of lumbar spine Patient has had this for quite some time but does seem to be stable at the moment.  Has not needed any injection.  Recently has lost weight which hopefully has helped her back some.  Responds well to manipulation.  Increase activity as tolerated.  Follow-up again 4 to 8 weeks.  White matter abnormality on MRI of brain Patient is awaiting another referral for another opinion on the white matter changes.    Nonallopathic problems  Decision  today to treat with OMT was based on Physical Exam  After verbal consent patient was treated with HVLA, ME, FPR techniques in cervical, rib, thoracic, lumbar, and sacral  areas  Patient tolerated the procedure well with improvement in symptoms  Patient given exercises, stretches and lifestyle modifications  See medications in patient instructions if given  Patient will follow up in 4-8 weeks      The above documentation has been reviewed and is accurate and complete [name], DO       Note: This dictation was prepared with Dragon dictation along with smaller phrase technology. Any transcriptional errors that result from this process are unintentional.

## 2020-09-21 ENCOUNTER — Ambulatory Visit: Payer: Self-pay

## 2020-09-21 ENCOUNTER — Encounter: Payer: Self-pay | Admitting: Family Medicine

## 2020-09-21 ENCOUNTER — Ambulatory Visit: Payer: 59 | Admitting: Family Medicine

## 2020-09-21 ENCOUNTER — Other Ambulatory Visit: Payer: Self-pay

## 2020-09-21 VITALS — BP 140/90 | HR 65 | Ht 65.0 in | Wt 213.0 lb

## 2020-09-21 DIAGNOSIS — R9082 White matter disease, unspecified: Secondary | ICD-10-CM

## 2020-09-21 DIAGNOSIS — M999 Biomechanical lesion, unspecified: Secondary | ICD-10-CM | POA: Diagnosis not present

## 2020-09-21 DIAGNOSIS — M67431 Ganglion, right wrist: Secondary | ICD-10-CM | POA: Diagnosis not present

## 2020-09-21 DIAGNOSIS — M4306 Spondylolysis, lumbar region: Secondary | ICD-10-CM

## 2020-09-21 DIAGNOSIS — M25531 Pain in right wrist: Secondary | ICD-10-CM | POA: Diagnosis not present

## 2020-09-21 NOTE — Patient Instructions (Addendum)
Good to see you If you get name for referral write me in MyChart Enjoy the new space See me again in 6 weeks

## 2020-09-21 NOTE — Assessment & Plan Note (Signed)
Repeat aspiration done again today.  Patient was going to have surgical intervention but unfortunately would have to be out of work for 3 months.  Patient feels like that is not possible especially with her going through separation and a recent move.  Patient's responded well to the aspiration today and I am hoping that we will have a longer improvement than previously.  Discussed avoiding repetitive activity where possible and follow-up again in 4 to 8 weeks

## 2020-09-21 NOTE — Assessment & Plan Note (Signed)
Patient is awaiting another referral for another opinion on the white matter changes.

## 2020-09-21 NOTE — Assessment & Plan Note (Signed)
Patient has had this for quite some time but does seem to be stable at the moment.  Has not needed any injection.  Recently has lost weight which hopefully has helped her back some.  Responds well to manipulation.  Increase activity as tolerated.  Follow-up again 4 to 8 weeks.

## 2020-09-25 ENCOUNTER — Encounter: Payer: Self-pay | Admitting: Family Medicine

## 2020-09-26 ENCOUNTER — Other Ambulatory Visit: Payer: Self-pay

## 2020-09-26 DIAGNOSIS — R9082 White matter disease, unspecified: Secondary | ICD-10-CM

## 2020-09-28 ENCOUNTER — Other Ambulatory Visit: Payer: Self-pay

## 2020-09-28 ENCOUNTER — Emergency Department (HOSPITAL_COMMUNITY): Payer: 59

## 2020-09-28 ENCOUNTER — Encounter (HOSPITAL_COMMUNITY): Payer: Self-pay | Admitting: Emergency Medicine

## 2020-09-28 ENCOUNTER — Emergency Department (HOSPITAL_COMMUNITY)
Admission: EM | Admit: 2020-09-28 | Discharge: 2020-09-28 | Disposition: A | Discharge status: Home / Self Care | Payer: 59 | Attending: Emergency Medicine | Admitting: Emergency Medicine

## 2020-09-28 DIAGNOSIS — R82998 Other abnormal findings in urine: Secondary | ICD-10-CM

## 2020-09-28 DIAGNOSIS — N201 Calculus of ureter: Secondary | ICD-10-CM | POA: Diagnosis not present

## 2020-09-28 DIAGNOSIS — N200 Calculus of kidney: Secondary | ICD-10-CM

## 2020-09-28 DIAGNOSIS — K8020 Calculus of gallbladder without cholecystitis without obstruction: Secondary | ICD-10-CM

## 2020-09-28 DIAGNOSIS — R109 Unspecified abdominal pain: Secondary | ICD-10-CM | POA: Diagnosis present

## 2020-09-28 LAB — CBC
HCT: 39.5 % (ref 36.0–46.0)
Hemoglobin: 12.6 g/dL (ref 12.0–15.0)
MCH: 27.8 pg (ref 26.0–34.0)
MCHC: 31.9 g/dL (ref 30.0–36.0)
MCV: 87.2 fL (ref 80.0–100.0)
Platelets: 286 10*3/uL (ref 150–400)
RBC: 4.53 MIL/uL (ref 3.87–5.11)
RDW: 13.2 % (ref 11.5–15.5)
WBC: 13.7 10*3/uL — ABNORMAL HIGH (ref 4.0–10.5)
nRBC: 0 % (ref 0.0–0.2)

## 2020-09-28 LAB — URINALYSIS, ROUTINE W REFLEX MICROSCOPIC
Bilirubin Urine: NEGATIVE
Glucose, UA: 50 mg/dL — AB
Ketones, ur: 20 mg/dL — AB
Nitrite: NEGATIVE
Protein, ur: 30 mg/dL — AB
RBC / HPF: >50 RBC/hpf — ABNORMAL HIGH (ref 0–5)
Specific Gravity, Urine: 1.018 (ref 1.005–1.030)
pH: 6 (ref 5.0–8.0)

## 2020-09-28 LAB — BASIC METABOLIC PANEL
Anion gap: 10 (ref 5–15)
BUN: 10 mg/dL (ref 6–20)
CO2: 20 mmol/L — ABNORMAL LOW (ref 22–32)
Calcium: 9 mg/dL (ref 8.9–10.3)
Chloride: 105 mmol/L (ref 98–111)
Creatinine, Ser: 0.73 mg/dL (ref 0.44–1.00)
GFR, Estimated: >60 mL/min (ref >60)
Glucose, Bld: 150 mg/dL — ABNORMAL HIGH (ref 70–99)
Potassium: 3.4 mmol/L — ABNORMAL LOW (ref 3.5–5.1)
Sodium: 135 mmol/L (ref 135–145)

## 2020-09-28 LAB — I-STAT BETA HCG BLOOD, ED (MC, WL, AP ONLY): I-stat hCG, quantitative: <5 m[IU]/mL (ref <5)

## 2020-09-28 LAB — CBG MONITORING, ED: Glucose-Capillary: 136 mg/dL — ABNORMAL HIGH (ref 70–99)

## 2020-09-28 MED ORDER — OXYCODONE-ACETAMINOPHEN 5-325 MG PO TABS
1.0000 | ORAL_TABLET | ORAL | Status: DC | PRN
  Administered 2020-09-28: 1 via ORAL
  Filled 2020-09-28: qty 1

## 2020-09-28 MED ORDER — ONDANSETRON 8 MG PO TBDP: 8.0000 mg | ORAL_TABLET | Freq: Three times a day (TID) | ORAL | 0 refills | Status: DC | PRN

## 2020-09-28 MED ORDER — HYDROMORPHONE HCL 1 MG/ML IJ SOLN
1.0000 mg | Freq: Once | INTRAMUSCULAR | Status: AC
  Administered 2020-09-28: 1 mg via INTRAVENOUS
  Filled 2020-09-28: qty 1

## 2020-09-28 MED ORDER — ONDANSETRON HCL 4 MG/2ML IJ SOLN
4.0000 mg | Freq: Once | INTRAMUSCULAR | Status: AC
  Administered 2020-09-28: 4 mg via INTRAVENOUS
  Filled 2020-09-28: qty 2

## 2020-09-28 MED ORDER — SODIUM CHLORIDE 0.9 % IV SOLN
1.0000 g | Freq: Once | INTRAVENOUS | Status: AC
  Administered 2020-09-28: 1 g via INTRAVENOUS
  Filled 2020-09-28: qty 10

## 2020-09-28 MED ORDER — CEPHALEXIN 500 MG PO CAPS: 500.0000 mg | ORAL_CAPSULE | Freq: Four times a day (QID) | ORAL | 0 refills | Status: DC

## 2020-09-28 MED ORDER — ONDANSETRON 4 MG PO TBDP
4.0000 mg | ORAL_TABLET | Freq: Once | ORAL | Status: AC | PRN
  Administered 2020-09-28: 4 mg via ORAL
  Filled 2020-09-28: qty 1

## 2020-09-28 MED ORDER — KETOROLAC TROMETHAMINE 30 MG/ML IJ SOLN
30.0000 mg | Freq: Once | INTRAMUSCULAR | Status: AC
  Administered 2020-09-28: 30 mg via INTRAVENOUS
  Filled 2020-09-28: qty 1

## 2020-09-28 MED ORDER — OXYCODONE-ACETAMINOPHEN 5-325 MG PO TABS: 1.0000 | ORAL_TABLET | Freq: Four times a day (QID) | ORAL | 0 refills | Status: DC | PRN

## 2020-09-28 NOTE — ED Notes (Signed)
While in triage, pt had syncopal episode, slid out of wheelchair onto floor. Pt assisted back to wheelchair by ED staff and was arroussable to painful stimuli at that time. Vital signs rechecked and WNL. CBG 136. Pt did not recall event, stated she just felt very nauseous. Denies any new pain.

## 2020-09-28 NOTE — Discharge Instructions (Addendum)
It was our pleasure to provide your ER care today - we hope that you feel better.  Your ct scan shows a 6 mm kidney stone in the proximal left ureter.   Your lab tests show a possible urine infection - take antibiotic as prescribed.   Drink plenty of fluids. Strain urine. Take motrin or aleve as need for pain. You may also take percocet as need for pain. No driving for the next 6 hours or when taking percocet. Also, do not take tylenol or acetaminophen containing medication when taking percocet.  Take zofran as need for nausea.   Follow up with urologist in the next 3-4 days if symptoms fail to improve/resolve - call office to arrange appointment.   Your ct scan also made incidental note of gallstones - discuss with primary care doctor at follow up with them.   Return to ER right away if worse, fevers, worsening or severe pain, persistent vomiting, weak/faint, or other concern.

## 2020-09-28 NOTE — ED Triage Notes (Signed)
Pt reports hx of kidney stone, severe L flank pain since 0200 this morning with vomiting.

## 2020-09-28 NOTE — ED Provider Notes (Signed)
[facility] EMERGENCY DEPARTMENT Provider Note   CSN: [id number] Arrival date & time: 09/28/20  3818     History Chief Complaint  Patient presents with  . Nephrolithiasis    Kristin [name] is a 35 y.o. female.  Patient w hx kidney stones, c/o left flank pain posteriorly, acute onset in past day, mod-severe, constant, dull, non radiating, similar to prior kidney stone pain. No dysuria. No hematuria. No fever or chills. Nausea. No vomiting. No abd distension or constipation. No cp or sob. No anterior/abd pain.   The history is provided by the patient.       Past Medical History:  Diagnosis Date  . Kidney stones   . Knee joint pain   . Pars defect of lumbar spine    L5  . Pregnancy induced hypertension   . Syncope     Patient Active Problem List   Diagnosis Date Noted  . White matter abnormality on MRI of brain 06/28/2020  . Dysesthesia 06/28/2020  . Left-sided weakness 06/28/2020  . Vision disturbance 06/28/2020  . Depression with anxiety 06/28/2020  . Insomnia 06/28/2020  . AMS (altered mental status) 06/13/2020  . Stroke-like symptoms 06/12/2020  . Ganglion cyst of volar aspect of right wrist 03/07/2020  . Superficial phlebitis 12/02/2019  . De Quervain's tenosynovitis, right 10/18/2019  . Pes anserinus bursitis of both knees 08/17/2019  . Epidermoid cyst of finger of right hand 03/09/2019  . Subluxation of extensor carpi ulnaris tendon, left, initial encounter 10/21/2018  . Plica syndrome of knee, right 07/03/2018  . SI (sacroiliac) joint dysfunction 03/18/2018  . Piriformis syndrome of left side 12/30/2017  . Nonallopathic lesion of sacral region 07/22/2017  . Nonallopathic lesion of lumbosacral region 07/22/2017  . Nonallopathic lesion of thoracic region 07/22/2017  . 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 Vaginal delivery 04/15/2016  . BMI 40.0-44.9, adult (HCC) 04/14/2016  . Kidney stones--hx  03/24/2016  . History of pregnancy  induced hypertension--prior pregnancy 03/24/2016  . Positive GBS test 03/24/2016  . LGA (large for gestational age) fetus--hx with 1st pregnancy, 9+6 03/01/2016  . Allergy to amoxicillin 03/01/2016  . 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 Syncope 08/21/2015  . Hypokalemia 08/21/2015  . Anxiety 08/21/2015    Past Surgical History:  Procedure Laterality Date  . EXTRACORPOREAL SHOCK WAVE LITHOTRIPSY Left 01/30/2017   Procedure: LEFT EXTRACORPOREAL SHOCK WAVE LITHOTRIPSY (ESWL);  Surgeon: Malen Gauze, MD;  Location: [facility];  Service: Urology;  Laterality: Left;  . KIDNEY STONE SURGERY       OB History    Gravida  4   Para  2   Term  2   Preterm  0   AB  2   Living  2     SAB  2   IAB      Ectopic      Multiple  0   Live Births  1           Family History  Problem Relation Age of Onset  . CVA Mother   . Hypertension Mother   . Diabetes Mellitus II Mother   . Congestive Heart Failure Mother   . Stroke Mother   . CVA Father   . Hypertension Father   . Diabetes Mellitus II Father   . Heart disease Father   . Stroke Father   . Stroke Sister     Social History   Tobacco Use  . Smoking status: Never Smoker  .  Smokeless tobacco: Never Used  Substance Use Topics  . Alcohol use: Yes    Comment: rarely  . Drug use: No    Home Medications Prior to Admission medications   Medication Sig Start Date End Date Taking? Authorizing Provider  traZODone (DESYREL) 100 MG tablet TAKE 1 TABLET BY MOUTH EVERYDAY AT BEDTIME 07/20/20   Sater, Nanine Means, MD  Vitamin D, Ergocalciferol, (DRISDOL) 1.25 MG (50000 UNIT) CAPS capsule Take 1 capsule (50,000 Units total) by mouth every 7 (seven) days. 06/28/20   Sater, Nanine Means, MD    Allergies    Amoxicillin  Review of Systems   Review of Systems  Constitutional: Negative for chills and fever.  HENT: Negative for sore throat.   Eyes: Negative for redness.  Respiratory: Negative for  shortness of breath.   Cardiovascular: Negative for chest pain.  Gastrointestinal: Positive for nausea. Negative for abdominal pain.  Genitourinary: Positive for flank pain. Negative for dysuria.  Musculoskeletal: Negative for back pain and neck pain.  Skin: Negative for rash.  Neurological: Negative for headaches.  Hematological: Does not bruise/bleed easily.  Psychiatric/Behavioral: Negative for confusion.    Physical Exam Updated Vital Signs BP (!) 159/90 (BP Location: Right Arm)   Pulse 64   Temp 98.8 F (37.1 C) (Oral)   Resp 20   Ht 1.651 m (5\' 5" )   Wt 92.1 kg   SpO2 100%   BMI 33.78 kg/m   Physical Exam Vitals and nursing note reviewed.  Constitutional:      Appearance: Normal appearance. She is well-developed.  HENT:     Head: Atraumatic.     Nose: Nose normal.     Mouth/Throat:     Mouth: Mucous membranes are moist.  Eyes:     General: No scleral icterus.    Conjunctiva/sclera: Conjunctivae normal.  Neck:     Trachea: No tracheal deviation.  Cardiovascular:     Rate and Rhythm: Normal rate and regular rhythm.     Pulses: Normal pulses.     Heart sounds: Normal heart sounds. No murmur heard. No friction rub. No gallop.   Pulmonary:     Effort: Pulmonary effort is normal. No respiratory distress.     Breath sounds: Normal breath sounds.  Abdominal:     General: Bowel sounds are normal. There is no distension.     Palpations: Abdomen is soft.     Tenderness: There is no abdominal tenderness. There is no guarding.  Genitourinary:    Comments: No cva tenderness.  Musculoskeletal:        General: No swelling.     Cervical back: Normal range of motion and neck supple. No rigidity. No muscular tenderness.  Skin:    General: Skin is warm and dry.     Findings: No rash.  Neurological:     Mental Status: She is alert.     Comments: Alert, speech normal.   Psychiatric:        Mood and Affect: Mood normal.     ED Results / Procedures / Treatments    Labs (all labs ordered are listed, but only abnormal results are displayed) Results for orders placed or performed during the hospital encounter of 09/28/20  Urinalysis, Routine w reflex microscopic Urine, Clean Catch  Result Value Ref Range   Color, Urine YELLOW YELLOW   APPearance HAZY (A) CLEAR   Specific Gravity, Urine 1.018 1.005 - 1.030   pH 6.0 5.0 - 8.0   Glucose, UA 50 (A) NEGATIVE mg/dL  Hgb urine dipstick LARGE (A) NEGATIVE   Bilirubin Urine NEGATIVE NEGATIVE   Ketones, ur 20 (A) NEGATIVE mg/dL   Protein, ur 30 (A) NEGATIVE mg/dL   Nitrite NEGATIVE NEGATIVE   Leukocytes,Ua LARGE (A) NEGATIVE   RBC / HPF >50 (H) 0 - 5 RBC/hpf   WBC, UA 21-50 0 - 5 WBC/hpf   Bacteria, UA FEW (A) NONE SEEN   Squamous Epithelial / LPF 0-5 0 - 5   Mucus PRESENT    Amorphous Crystal PRESENT   Basic metabolic panel  Result Value Ref Range   Sodium 135 135 - 145 mmol/L   Potassium 3.4 (L) 3.5 - 5.1 mmol/L   Chloride 105 98 - 111 mmol/L   CO2 20 (L) 22 - 32 mmol/L   Glucose, Bld 150 (H) 70 - 99 mg/dL   BUN 10 6 - 20 mg/dL   Creatinine, Ser 1.03 0.44 - 1.00 mg/dL   Calcium 9.0 8.9 - 15.9 mg/dL   GFR, Estimated >45 >85 mL/min   Anion gap 10 5 - 15  CBC  Result Value Ref Range   WBC 13.7 (H) 4.0 - 10.5 K/uL   RBC 4.53 3.87 - 5.11 MIL/uL   Hemoglobin 12.6 12.0 - 15.0 g/dL   HCT 92.9 24.4 - 62.8 %   MCV 87.2 80.0 - 100.0 fL   MCH 27.8 26.0 - 34.0 pg   MCHC 31.9 30.0 - 36.0 g/dL   RDW 63.8 17.7 - 11.6 %   Platelets 286 150 - 400 K/uL   nRBC 0.0 0.0 - 0.2 %  I-Stat beta hCG blood, ED  Result Value Ref Range   I-stat hCG, quantitative <5.0 <5 mIU/mL   Comment 3          CBG monitoring, ED  Result Value Ref Range   Glucose-Capillary 136 (H) 70 - 99 mg/dL   [place] LIMITED JOINT SPACE STRUCTURES UP RIGHT(NO LINKED CHARGES)  Result Date: 09/25/2020 Procedure: Real-time Ultrasound Guided Injection of right ganglion cyst Device: GE Logiq Q7 Ultrasound guided injection is preferred based  studies that show increased duration, increased effect, greater accuracy, decreased procedural pain, increased response rate, and decreased cost with ultrasound guided versus blind injection. Verbal informed consent obtained. Time-out conducted. Noted no overlying erythema, induration, or other signs of local infection. Skin prepped in a sterile fashion. Local anesthesia: Topical Ethyl chloride. With sterile technique and under real time ultrasound guidance: With an 18-gauge 1-1/2 inch needle injected with 0.5 cc of 0.5% Marcaine then aspirated and significant amount of gel-like material.  Patient then injected 0.5 cc of Kenalog 40 mg/mL. Completed without difficulty Pain immediately improved suggesting accurate placement of the medication. Advised to call if fevers/chills, erythema, induration, drainage, or persistent bleeding. Impression: Technically successful ultrasound guided injection.    EKG None  Radiology CT Renal Stone Study  Result Date: 09/28/2020 CLINICAL DATA:  35 year old female with acute LEFT flank and abdominal pain. EXAM: CT ABDOMEN AND PELVIS WITHOUT CONTRAST TECHNIQUE: Multidetector CT imaging of the abdomen and pelvis was performed following the standard protocol without IV contrast. COMPARISON:  01/15/2017 CT FINDINGS: Please note that parenchymal abnormalities may be missed without intravenous contrast. Lower chest: Mild bibasilar atelectasis is noted. Hepatobiliary: The liver is unremarkable. Cholelithiasis identified without CT evidence of acute cholecystitis. No biliary dilatation. Pancreas: Unremarkable Spleen: Unremarkable Adrenals/Urinary Tract: A 6 mm LEFT UPJ calculus causes moderate to severe LEFT hydronephrosis and perinephric inflammation. Scattered punctate nonobstructing calculi within the RIGHT kidney are noted. Two adjacent nonobstructing calculi  within the LOWER LEFT kidney are noted measuring 5 mm and 3 mm. The adrenal glands and bladder are unremarkable.  Stomach/Bowel: Stomach is within normal limits. Appendix appears normal. No evidence of bowel wall thickening, distention, or inflammatory changes. Vascular/Lymphatic: No significant vascular findings are present. No enlarged abdominal or pelvic lymph nodes. Reproductive: No gross abnormality. Other: No ascites, focal collection or pneumoperitoneum. Musculoskeletal: No acute or suspicious bony abnormalities. IMPRESSION: 1. 6 mm LEFT UPJ calculus causing moderate to severe LEFT hydronephrosis and perinephric inflammation. 2. Nonobstructing bilateral renal calculi. 3. Cholelithiasis without CT evidence of acute cholecystitis. Electronically Signed   By: Harmon Pier M.D.   On: 09/28/2020 17:36    Procedures Procedures (including critical care time)  Medications Ordered in ED Medications  oxyCODONE-acetaminophen (PERCOCET/ROXICET) 5-325 MG per tablet 1 tablet (1 tablet Oral Given 09/28/20 0834)  HYDROmorphone (DILAUDID) injection 1 mg (has no administration in time range)  ondansetron (ZOFRAN) injection 4 mg (has no administration in time range)  ketorolac (TORADOL) 30 MG/ML injection 30 mg (has no administration in time range)  ondansetron (ZOFRAN-ODT) disintegrating tablet 4 mg (4 mg Oral Given 09/28/20 6761)    ED Course  I have reviewed the triage vital signs and the nursing notes.  Pertinent labs & imaging results that were available during my care of the patient were reviewed by me and considered in my medical decision making (see chart for details).    MDM Rules/Calculators/A&P                         Iv. Dilaudid iv. zofran iv.   Reviewed nursing notes and prior charts for additional history.   Labs and ct.   CT reviewed/interpreted by me - left ureteral stone.   Recheck pt, symptoms resolved. No pain. Pt afebrile. Normal vitals.   Labs reviewed/interpreted by me - chem normal. UA w 21-50 wbc, many rbc, possible uti - urine culture sent. Rocephin iv. Pt afebrile. No chills/sweats.  Abd soft nt.   Pts symptoms remain resolved. Pt currently appears stable for d/c.  Rec urology f/u.  Rx for home provided.   Return precautions also provided.     Final Clinical Impression(s) / ED Diagnoses Final diagnoses:  None    Rx / DC Orders ED Discharge Orders    None       Cathren Laine, MD 09/28/20 1836

## 2020-10-02 LAB — URINE CULTURE: Culture: >=100000 — AB

## 2020-10-19 ENCOUNTER — Ambulatory Visit: Payer: 59 | Admitting: Neurology

## 2020-11-01 NOTE — Progress Notes (Deleted)
[name] [address] [place] [address] Phone: [phone] Subjective:    I'm seeing this patient by the request  of:  Patient, No Pcp Per  CC: back pain and wrist pain  Follow up   YDX:AJOINOMVEH   09/21/2020 Patient is awaiting another referral for another opinion on the white matter changes.  Patient has had this for quite some time but does seem to be stable at the moment.  Has not needed any injection.  Recently has lost weight which hopefully has helped her back some.  Responds well to manipulation.  Increase activity as tolerated.  Follow-up again 4 to 8 weeks.  Repeat aspiration done again today.  Patient was going to have surgical intervention but unfortunately would have to be out of work for 3 months.  Patient feels like that is not possible especially with her going through separation and a recent move.  Patient's responded well to the aspiration today and I am hoping that we will have a longer improvement than previously.  Discussed avoiding repetitive activity where possible and follow-up again in 4 to 8 weeks  Update 11/02/2020 Kristin [name] is a 35 y.o. female coming in with complaint of back and neck pain. OMT 09/21/2020. Patient was to follow up with another provider regarding MS. patient is also been in the hospital for a kidney stone on the left side.  Patient states   Medications patient has been prescribed:   Taking:         Reviewed prior external information including notes and imaging from previsou exam, outside providers and external EMR if available.   As well as notes that were available from care everywhere and other healthcare systems.  Past medical history, social, surgical and family history all reviewed in electronic medical record.  No pertanent information unless stated regarding to the chief complaint.   Past Medical History:  Diagnosis Date  . Kidney stones   . Knee joint pain   . Pars defect of  lumbar spine    L5  . Pregnancy induced hypertension   . Syncope     Allergies  Allergen Reactions  . Amoxicillin Hives    Has patient had a PCN reaction causing immediate rash, facial/tongue/throat swelling, SOB or lightheadedness with hypotension: no Has patient had a PCN reaction causing severe rash involving mucus membranes or skin necrosis: No Has patient had a PCN reaction that required hospitalization No Has patient had a PCN reaction occurring within the last 10 years: No If all of the above answers are "NO", then may proceed with Cephalosporin use.     Review of Systems:  No headache, visual changes, nausea, vomiting, diarrhea, constipation, dizziness, abdominal pain, skin rash, fevers, chills, night sweats, weight loss, swollen lymph nodes, body aches, joint swelling, chest pain, shortness of breath, mood changes. POSITIVE muscle aches  Objective  currently breastfeeding.   General: No apparent distress alert and oriented x3 mood and affect normal, dressed appropriately.  HEENT: Pupils equal, extraocular movements intact  Respiratory: Patient's speak in full sentences and does not appear short of breath  Cardiovascular: No lower extremity edema, non tender, no erythema  Neuro: Cranial nerves II through XII are intact, neurovascularly intact in all extremities with 2+ DTRs and 2+ pulses.  Gait normal with good balance and coordination.  MSK:  Non tender with full range of motion and good stability and symmetric strength and tone of shoulders, elbows, wrist, hip, knee and ankles bilaterally.  Back - Normal skin, Spine with normal alignment and no deformity.  No tenderness to vertebral process palpation.  Paraspinous muscles are not tender and without spasm.   Range of motion is full at neck and lumbar sacral regions  Osteopathic findings  C2 flexed rotated and side bent right C6 flexed rotated and side bent left T3 extended rotated and side bent right inhaled rib T9  extended rotated and side bent left L2 flexed rotated and side bent right Sacrum right on right       Assessment and Plan:    Nonallopathic problems  Decision today to treat with OMT was based on Physical Exam  After verbal consent patient was treated with HVLA, ME, FPR techniques in cervical, rib, thoracic, lumbar, and sacral  areas  Patient tolerated the procedure well with improvement in symptoms  Patient given exercises, stretches and lifestyle modifications  See medications in patient instructions if given  Patient will follow up in 4-8 weeks      The above documentation has been reviewed and is accurate and complete [name], DO       Note: This dictation was prepared with Dragon dictation along with smaller phrase technology. Any transcriptional errors that result from this process are unintentional.

## 2020-11-02 ENCOUNTER — Ambulatory Visit: Payer: 59 | Admitting: Family Medicine

## 2020-11-29 NOTE — Progress Notes (Signed)
Chief Complaint  Patient presents with   Follow-up    RM 1 alone Pt is well, just hot all the time.      HISTORY OF PRESENT ILLNESS: 11/30/20 ALL:  Kristin [name] is a 35 y.o. female here today for follow up for white matter abnormalities. Concerns for MS prompted consult with Dr Felecia Shelling. Imaging was not conclusive and additional workup warranted. Dr Felecia Shelling ordered LP but this has not been performed. She is going through a divorce. She has second opinion appt with neurology at [facility] in May.   She feels that she is doing fairly well other than increased stress. No new or worsening symptoms. She is working full time at [employer] with [employer]. Her employer is asking for clearance for her to continue to work.   She took one dose of trazodone and felt that she slept too long. She slept well for about 10 hours. Her daughter had a hard time waking her up the next morning. She has not tried taking 1/2.   Blood pressure was elevated today. She does not routinely check at home. She does admit to more stress and anxiety with recent separation. She does not have a primary care provider.   HISTORY (copied from Dr Garth Bigness previous note)   I had the pleasure of seeing patient, Tameka Hoiland, at [facility] for neurologic consultation regarding her abnormal brain MRI and concerned about the possibility of multiple sclerosis.  She is a 35 year old woman who noted shortness of breath and dizziness.   Balance was off and she was stumbling.   She felt she was dragging the left foot.   She had two falls 06/01/20 and 06/20/20.    She went to the ED after the first fall.  As she was baseline during the evaluation CT scan was not performed.  On 06/12/2020, she presented to the emergency room with abdominal pain or vaginal bleeding.  While in the emergency room, according to the notes, she became unresponsive with her eyes rolling up.  The episode lasted 5 minutes and she  was weak on the left side afterwards..  No generalized tonic-clonic activity occurred.  She was seen by neurology and an MRI of the brain was ordered.  It showed some T2 hyperintense foci potentially concerning for MS.Marland Kitchen   She was admitted and additional MRI's were performed showing some white matter foci.She also noted blurry.      She did not receive steroids.  Potassium was borderline low and white blood cells were minimally elevated. other tests including, HIV, SARS-CoV-2 RT-PCR, B12 and TSH were normal or negative.  She was admitted.  She had an EEG performed which was normal.  There were 2 episodes of left arm twitching and one episode of right arm twitching and staring off that were not associated with EEG changes and were felt to be nonepileptic.  MRI of the spine was performed.  The spinal cord was read as a possible focus at T4.  I reviewed the images and feel it is more likely to be artifact.  She had arthralgias earlier this year.  Blood work in April 2021 including ANA, ACE, rheumatoid factor,  TSH, uric acid, CCP, C-reactive peptide PTH was negative.  Vitamin D was low.  Ferritin was low normal.  ESR was borderline (21)  Currently, she notes balance is still off.   She continues note shortness of breath and a tight sensation in her chest.  She feels the left side is mildly weak and she notes numbness in the left arm and leg.    Bladder function is fine.    She feels the left eye vision is blurry.    Color vision is symmetric.  She is sleeping poorly.   She reports she is getting 3 hours sleep many nights.   She feels short term memory is reduced.   She notes reduced focus and processing, especially with conversation.     She notes some anxiety.   She has mild depression having recently lost her mother and grandmother.     According to the note from 06/12/2020, the husband told the medical hospitalist that the patient had syncopal episodes in the past and had an EEG that came back negative.   He reported that when she is anxious she does not talk.  Interestingly, when she was asked a question she type the response on her phone rather than speak.  She has occasional headaches but migraine aura.    She denies HTN, smoking or diabetes mellitus.     Her mother had 2 strokes.         Data and Imaging review: I personally reviewed MRIs as follows: MRI of the head 06/12/2020:  There are scattered T2/FLAIR hyperintense foci in the hemispheres including a couple in the periventricular white matter.  Most foci are nonspecific and in the deep white matter.  None of the foci enhance or appear to be acute.  No enhancing lesions.  MRI of the cervical spine 06/12/2020: The spinal cord appears normal.  There is a disc protrusion at C5-C6 causing mild spinal stenosis but no nerve root compression.  MRI of the thoracic spine 06/12/2020: The official interpretation is a possible focus adjacent to T4.  I believe that this finding (only seen on sagittal T2-weighted images and not on sagittal STIR or axial T2 or GRE views) represents artifact.  The thoracic spine is otherwise normal.  CT 06/20/2020: Normal no acute findings.  Laboratory data 12/29/2019 through 06/20/2020:  Blood work in April 2021 including ANA, ACE, rheumatoid factor,  TSH, uric acid, CCP, C-reactive peptide PTH was negative.  Vitamin D was low.  Ferritin was low normal.  ESR was borderline (21)    REVIEW OF SYSTEMS: Out of a complete 14 system review of symptoms, the patient complains only of the following symptoms, hot flashes, right sided weakness, tremors and all other reviewed systems are negative.    ALLERGIES: Allergies  Allergen Reactions   Amoxicillin Hives    Has patient had a PCN reaction causing immediate rash, facial/tongue/throat swelling, SOB or lightheadedness with hypotension: no Has patient had a PCN reaction causing severe rash involving mucus membranes or skin necrosis: No Has patient had a PCN reaction  that required hospitalization No Has patient had a PCN reaction occurring within the last 10 years: No If all of the above answers are "NO", then may proceed with Cephalosporin use.     HOME MEDICATIONS: Outpatient Medications Prior to Visit  Medication Sig Dispense Refill   acetaminophen (TYLENOL) 325 MG tablet Take 325 mg by mouth daily as needed for mild pain or moderate pain.     traZODone (DESYREL) 100 MG tablet TAKE 1 TABLET BY MOUTH EVERYDAY AT BEDTIME 90 tablet 1   Vitamin D, Ergocalciferol, (DRISDOL) 1.25 MG (50000 UNIT) CAPS capsule Take 1 capsule (50,000 Units total) by mouth every 7 (seven) days. 13 capsule 2   cephALEXin (KEFLEX) 500 MG capsule Take  1 capsule (500 mg total) by mouth 4 (four) times daily. 28 capsule 0   ondansetron (ZOFRAN ODT) 8 MG disintegrating tablet Take 1 tablet (8 mg total) by mouth every 8 (eight) hours as needed for nausea or vomiting. 10 tablet 0   oxyCODONE-acetaminophen (PERCOCET/ROXICET) 5-325 MG tablet Take 1-2 tablets by mouth every 6 (six) hours as needed for severe pain. 20 tablet 0   No facility-administered medications prior to visit.     PAST MEDICAL HISTORY: Past Medical History:  Diagnosis Date   Kidney stones    Knee joint pain    Pars defect of lumbar spine    L5   Pregnancy induced hypertension    Syncope      PAST SURGICAL HISTORY: Past Surgical History:  Procedure Laterality Date   EXTRACORPOREAL SHOCK WAVE LITHOTRIPSY Left 01/30/2017   Procedure: LEFT EXTRACORPOREAL SHOCK WAVE LITHOTRIPSY (ESWL);  Surgeon: Cleon Gustin, MD;  Location: [facility];  Service: Urology;  Laterality: Left;   KIDNEY STONE SURGERY       FAMILY HISTORY: Family History  Problem Relation Age of Onset   CVA Mother    Hypertension Mother    Diabetes Mellitus II Mother    Congestive Heart Failure Mother    Stroke Mother    CVA Father    Hypertension Father    Diabetes Mellitus II Father    Heart disease Father     Stroke Father    Stroke Sister      SOCIAL HISTORY: Social History   Socioeconomic History   Marital status: Married    Spouse name: Marlon Pel   Number of children: 2   Years of education: BA   Highest education level: Not on file  Occupational History   Occupation: [employer]  Tobacco Use   Smoking status: Never Smoker   Smokeless tobacco: Never Used  Substance and Sexual Activity   Alcohol use: Yes    Comment: rarely   Drug use: No   Sexual activity: Not on file  Other Topics Concern   Not on file  Social History Narrative   Right handed   No caffeine use   Lives with husband and two kids   Social Determinants of Health   Financial Resource Strain: Not on file  Food Insecurity: Not on file  Transportation Needs: Not on file  Physical Activity: Not on file  Stress: Not on file  Social Connections: Not on file  Intimate Partner Violence: Not on file      PHYSICAL EXAM  Vitals:   11/30/20 1010  BP: (!) 151/97  Pulse: 70  Weight: 212 lb (96.2 kg)  Height: 5' 5"  (1.651 m)   Body mass index is 35.28 kg/m.   Generalized: Well developed, in no acute distress  Cardiology: normal rate and rhythm, no murmur auscultated  Respiratory: clear to auscultation bilaterally    Neurological examination  Mentation: Alert oriented to time, place, history taking. Follows all commands speech and language fluent Cranial nerve II-XII: Pupils were equal round reactive to light. Extraocular movements were full, visual field were full on confrontational test. Facial sensation and strength were normal. Head turning and shoulder shrug  were normal and symmetric. Motor: The motor testing reveals 5 over 5 strength of all 4 extremities. Good symmetric motor tone is noted throughout.  Gait and station: Gait is normal.      DIAGNOSTIC DATA (LABS, IMAGING, TESTING) - I reviewed patient records, labs, notes, testing and imaging myself where  available.  Lab Results  Component Value Date   WBC 13.7 (H) 09/28/2020   HGB 12.6 09/28/2020   HCT 39.5 09/28/2020   MCV 87.2 09/28/2020   PLT 286 09/28/2020      Component Value Date/Time   NA 135 09/28/2020 0833   K 3.4 (L) 09/28/2020 0833   CL 105 09/28/2020 0833   CO2 20 (L) 09/28/2020 0833   GLUCOSE 150 (H) 09/28/2020 0833   BUN 10 09/28/2020 0833   CREATININE 0.73 09/28/2020 0833   CALCIUM 9.0 09/28/2020 0833   PROT 7.8 06/20/2020 1137   ALBUMIN 3.9 06/20/2020 1137   AST 17 06/20/2020 1137   ALT 16 06/20/2020 1137   ALKPHOS 64 06/20/2020 1137   BILITOT 0.7 06/20/2020 1137   GFRNONAA >60 09/28/2020 0833   GFRAA >60 06/20/2020 1137   Lab Results  Component Value Date   CHOL 131 06/13/2020   HDL 34 (L) 06/13/2020   LDLCALC 79 06/13/2020   TRIG 90 06/13/2020   CHOLHDL 3.9 06/13/2020   Lab Results  Component Value Date   HGBA1C 5.0 06/13/2020   Lab Results  Component Value Date   AVWPVXYI01 655 06/12/2020   Lab Results  Component Value Date   TSH 0.896 06/12/2020    No flowsheet data found.   No flowsheet data found.   ASSESSMENT AND PLAN  35 y.o. year old female  has a past medical history of Kidney stones, Knee joint pain, Pars defect of lumbar spine, Pregnancy induced hypertension, and Syncope. here with   White matter abnormality on MRI of brain  Depression with anxiety - Plan: Ambulatory referral to Family Practice  Insomnia, unspecified type - Plan: Ambulatory referral to [facility]  Elevated blood pressure reading in office without diagnosis of hypertension - Plan: Ambulatory referral to [facility]  She is doing well, today.  No new or worsening symptoms.  She does continue to have difficulty with sleep and anxiety.  Right-sided weakness is unchanged.  No evidence of weakness on exam.  She was encouraged to try half a tablet of trazodone, 50 mg, nightly as needed for insomnia.  I will refer her to primary care for management of  blood pressure, insomnia and anxiety.  She has an appointment with [facility] in May for a second opinion to review abnormalities found on MRI.  We have reviewed these results in the office today.  She was encouraged to monitor blood pressures closely.  Healthy lifestyle habits encouraged.  She may return for follow-up with Dr. Felecia Shelling in 6 to 8 months pending consult with [facility].  She does not wish to perform LP at this time.  She verbalizes understanding and agreement with this plan.    Orders Placed This Encounter  Procedures   Ambulatory referral to Family Practice    Referral Priority:   Routine    Referral Type:   Consultation    Referral Reason:   Specialty Services Required    Requested Specialty:   Family Medicine    Number of Visits Requested:   1     No orders of the defined types were placed in this encounter.     I spent 20 minutes of face-to-face and non-face-to-face time with patient.  This included previsit chart review, lab review, study review, order entry, electronic health record documentation, patient education.    Debbora Presto, MSN, FNP-C 11/30/2020, 10:56 AM  [facility] [address], [place], [place] [address] [phone]

## 2020-11-30 ENCOUNTER — Ambulatory Visit: Payer: 59 | Admitting: Family Medicine

## 2020-11-30 ENCOUNTER — Other Ambulatory Visit: Payer: Self-pay

## 2020-11-30 ENCOUNTER — Encounter: Payer: Self-pay | Admitting: Family Medicine

## 2020-11-30 VITALS — BP 151/97 | HR 70 | Ht 65.0 in | Wt 212.0 lb

## 2020-11-30 DIAGNOSIS — F418 Other specified anxiety disorders: Secondary | ICD-10-CM

## 2020-11-30 DIAGNOSIS — R9082 White matter disease, unspecified: Secondary | ICD-10-CM | POA: Diagnosis not present

## 2020-11-30 DIAGNOSIS — G4700 Insomnia, unspecified: Secondary | ICD-10-CM | POA: Diagnosis not present

## 2020-11-30 DIAGNOSIS — R030 Elevated blood-pressure reading, without diagnosis of hypertension: Secondary | ICD-10-CM

## 2020-11-30 NOTE — Progress Notes (Signed)
I have read the note, and I agree with the clinical assessment and plan.  Richard A. Sater, MD, PhD, FAAN Certified in Neurology, Clinical Neurophysiology, Sleep Medicine, Pain Medicine and Neuroimaging  [facility] [address], [address] [place], [place] [address] [phone]  

## 2020-11-30 NOTE — Patient Instructions (Signed)
Below is our plan:  We will try taking 1/2 tablet (50mg ) of trazodone for help with insomnia. We will refer you to primary care. Please keep an eye on your blood pressures at home. We do not have any specific restrictions for work. From a neurologic standpoint, you should be ok to continue full time employment.   Please make sure you are staying well hydrated. I recommend 50-60 ounces daily. Well balanced diet and regular exercise encouraged. Consistent sleep schedule with 6-8 hours recommended.   Please continue follow up with care team as directed.   Follow up with Dr pending second opinion with [facility]. I would suggest 6-8 months.   You may receive a survey regarding today's visit. I encourage you to leave honest feed back as I do use this information to improve patient care. Thank you for seeing me today!      Hypertension, Adult Hypertension is another name for high blood pressure. High blood pressure forces your heart to work harder to pump blood. This can cause problems over time. There are two numbers in a blood pressure reading. There is a top number (systolic) over a bottom number (diastolic). It is best to have a blood pressure that is below 120/80. Healthy choices can help lower your blood pressure, or you may need medicine to help lower it. What are the causes? The cause of this condition is not known. Some conditions may be related to high blood pressure. What increases the risk?  Smoking.  Having type 2 diabetes mellitus, high cholesterol, or both.  Not getting enough exercise or physical activity.  Being overweight.  Having too much fat, sugar, calories, or salt (sodium) in your diet.  Drinking too much alcohol.  Having long-term (chronic) kidney disease.  Having a family history of high blood pressure.  Age. Risk increases with age.  Race. You may be at higher risk if you are African American.  Gender. Men are at higher risk than women before age  66. After age 45, women are at higher risk than men.  Having obstructive sleep apnea.  Stress. What are the signs or symptoms?  High blood pressure may not cause symptoms. Very high blood pressure (hypertensive crisis) may cause: ? Headache. ? Feelings of worry or nervousness (anxiety). ? Shortness of breath. ? Nosebleed. ? A feeling of being sick to your stomach (nausea). ? Throwing up (vomiting). ? Changes in how you see. ? Very bad chest pain. ? Seizures. How is this treated?  This condition is treated by making healthy lifestyle changes, such as: ? Eating healthy foods. ? Exercising more. ? Drinking less alcohol.  Your health care provider may prescribe medicine if lifestyle changes are not enough to get your blood pressure under control, and if: ? Your top number is above 130. ? Your bottom number is above 80.  Your personal target blood pressure may vary. Follow these instructions at home: Eating and drinking  If told, follow the DASH eating plan. To follow this plan: ? Fill one half of your plate at each meal with fruits and vegetables. ? Fill one fourth of your plate at each meal with whole grains. Whole grains include whole-wheat pasta, brown rice, and whole-grain bread. ? Eat or drink low-fat dairy products, such as skim milk or low-fat yogurt. ? Fill one fourth of your plate at each meal with low-fat (lean) proteins. Low-fat proteins include fish, chicken without skin, eggs, beans, and tofu. ? Avoid fatty meat, cured and processed  meat, or chicken with skin. ? Avoid pre-made or processed food.  Eat less than 1,500 mg of salt each day.  Do not drink alcohol if: ? Your doctor tells you not to drink. ? You are pregnant, may be pregnant, or are planning to become pregnant.  If you drink alcohol: ? Limit how much you use to:  0-1 drink a day for women.  0-2 drinks a day for men. ? Be aware of how much alcohol is in your drink. In the [place] one drink equals  one 12 oz bottle of beer (355 mL), one 5 oz glass of wine (148 mL), or one 1 oz glass of hard liquor (44 mL).   Lifestyle  Work with your doctor to stay at a healthy weight or to lose weight. Ask your doctor what the best weight is for you.  Get at least 30 minutes of exercise most days of the week. This may include walking, swimming, or biking.  Get at least 30 minutes of exercise that strengthens your muscles (resistance exercise) at least 3 days a week. This may include lifting weights or doing Pilates.  Do not use any products that contain nicotine or tobacco, such as cigarettes, e-cigarettes, and chewing tobacco. If you need help quitting, ask your doctor.  Check your blood pressure at home as told by your doctor.  Keep all follow-up visits as told by your doctor. This is important.   Medicines  Take over-the-counter and prescription medicines only as told by your doctor. Follow directions carefully.  Do not skip doses of blood pressure medicine. The medicine does not work as well if you skip doses. Skipping doses also puts you at risk for problems.  Ask your doctor about side effects or reactions to medicines that you should watch for. Contact a doctor if you:  Think you are having a reaction to the medicine you are taking.  Have headaches that keep coming back (recurring).  Feel dizzy.  Have swelling in your ankles.  Have trouble with your vision. Get help right away if you:  Get a very bad headache.  Start to feel mixed up (confused).  Feel weak or numb.  Feel faint.  Have very bad pain in your: ? Chest. ? Belly (abdomen).  Throw up more than once.  Have trouble breathing. Summary  Hypertension is another name for high blood pressure.  High blood pressure forces your heart to work harder to pump blood.  For most people, a normal blood pressure is less than 120/80.  Making healthy choices can help lower blood pressure. If your blood pressure does not  get lower with healthy choices, you may need to take medicine. This information is not intended to replace advice given to you by your health care provider. Make sure you discuss any questions you have with your health care provider. Document Revised: 05/20/2018 Document Reviewed: 05/20/2018 Elsevier Patient Education  2021 Elsevier Inc.   Insomnia Insomnia is a sleep disorder that makes it difficult to fall asleep or stay asleep. Insomnia can cause fatigue, low energy, difficulty concentrating, mood swings, and poor performance at work or school. There are three different ways to classify insomnia:  Difficulty falling asleep.  Difficulty staying asleep.  Waking up too early in the morning. Any type of insomnia can be long-term (chronic) or short-term (acute). Both are common. Short-term insomnia usually lasts for three months or less. Chronic insomnia occurs at least three times a week for longer than three months. What are  the causes? Insomnia may be caused by another condition, situation, or substance, such as:  Anxiety.  Certain medicines.  Gastroesophageal reflux disease (GERD) or other gastrointestinal conditions.  Asthma or other breathing conditions.  Restless legs syndrome, sleep apnea, or other sleep disorders.  Chronic pain.  Menopause.  Stroke.  Abuse of alcohol, tobacco, or illegal drugs.  Mental health conditions, such as depression.  Caffeine.  Neurological disorders, such as Alzheimer's disease.  An overactive thyroid (hyperthyroidism). Sometimes, the cause of insomnia may not be known. What increases the risk? Risk factors for insomnia include:  Gender. Women are affected more often than men.  Age. Insomnia is more common as you get older.  Stress.  Lack of exercise.  Irregular work schedule or working night shifts.  Traveling between different time zones.  Certain medical and mental health conditions. What are the signs or symptoms? If  you have insomnia, the main symptom is having trouble falling asleep or having trouble staying asleep. This may lead to other symptoms, such as:  Feeling fatigued or having low energy.  Feeling nervous about going to sleep.  Not feeling rested in the morning.  Having trouble concentrating.  Feeling irritable, anxious, or depressed. How is this diagnosed? This condition may be diagnosed based on:  Your symptoms and medical history. Your health care provider may ask about: ? Your sleep habits. ? Any medical conditions you have. ? Your mental health.  A physical exam. How is this treated? Treatment for insomnia depends on the cause. Treatment may focus on treating an underlying condition that is causing insomnia. Treatment may also include:  Medicines to help you sleep.  Counseling or therapy.  Lifestyle adjustments to help you sleep better. Follow these instructions at home: Eating and drinking  Limit or avoid alcohol, caffeinated beverages, and cigarettes, especially close to bedtime. These can disrupt your sleep.  Do not eat a large meal or eat spicy foods right before bedtime. This can lead to digestive discomfort that can make it hard for you to sleep.   Sleep habits  Keep a sleep diary to help you and your health care provider figure out what could be causing your insomnia. Write down: ? When you sleep. ? When you wake up during the night. ? How well you sleep. ? How rested you feel the next day. ? Any side effects of medicines you are taking. ? What you eat and drink.  Make your bedroom a dark, comfortable place where it is easy to fall asleep. ? Put up shades or blackout curtains to block light from outside. ? Use a white noise machine to block noise. ? Keep the temperature cool.  Limit screen use before bedtime. This includes: ? Watching TV. ? Using your smartphone, tablet, or computer.  Stick to a routine that includes going to bed and waking up at the same  times every day and night. This can help you fall asleep faster. Consider making a quiet activity, such as reading, part of your nighttime routine.  Try to avoid taking naps during the day so that you sleep better at night.  Get out of bed if you are still awake after 15 minutes of trying to sleep. Keep the lights down, but try reading or doing a quiet activity. When you feel sleepy, go back to bed.   General instructions  Take over-the-counter and prescription medicines only as told by your health care provider.  Exercise regularly, as told by your health care provider. Avoid exercise  starting several hours before bedtime.  Use relaxation techniques to manage stress. Ask your health care provider to suggest some techniques that may work well for you. These may include: ? Breathing exercises. ? Routines to release muscle tension. ? Visualizing peaceful scenes.  Make sure that you drive carefully. Avoid driving if you feel very sleepy.  Keep all follow-up visits as told by your health care provider. This is important. Contact a health care provider if:  You are tired throughout the day.  You have trouble in your daily routine due to sleepiness.  You continue to have sleep problems, or your sleep problems get worse. Get help right away if:  You have serious thoughts about hurting yourself or someone else. If you ever feel like you may hurt yourself or others, or have thoughts about taking your own life, get help right away. You can go to your nearest emergency department or call:  Your local emergency services (911 in the [place]  A suicide crisis helpline, such as the National Suicide Prevention Lifeline at 579-451-2093. This is open 24 hours a day. Summary  Insomnia is a sleep disorder that makes it difficult to fall asleep or stay asleep.  Insomnia can be long-term (chronic) or short-term (acute).  Treatment for insomnia depends on the cause. Treatment may focus on treating  an underlying condition that is causing insomnia.  Keep a sleep diary to help you and your health care provider figure out what could be causing your insomnia. This information is not intended to replace advice given to you by your health care provider. Make sure you discuss any questions you have with your health care provider. Document Revised: 07/20/2020 Document Reviewed: 07/20/2020 Elsevier Patient Education  2021 [employer].

## 2020-12-12 ENCOUNTER — Ambulatory Visit: Payer: 59 | Admitting: Family Medicine

## 2020-12-12 NOTE — Progress Notes (Deleted)
[name] [address] [place] [address] Phone: [phone] Subjective:    I'm seeing this patient by the request  of:  Patient, No Pcp Per  CC:   [id number]   09/21/2020 Patient has had this for quite some time but does seem to be stable at the moment.  Has not needed any injection.  Recently has lost weight which hopefully has helped her back some.  Responds well to manipulation.  Increase activity as tolerated.  Follow-up again 4 to 8 weeks.  Repeat aspiration [name] again today.  Patient was going to have surgical intervention but unfortunately would have to be out of work for 3 months.  Patient feels like that is not possible especially with her going through separation and a recent move.  Patient's responded well to the aspiration today and I am hoping that we will have a longer improvement than previously.  Discussed avoiding repetitive activity where possible and follow-up again in 4 to 8 weeks  Update 12/12/2020 Kristin [name] is a 35 y.o. female coming in with complaint of back and neck pain. Also f/u for right wrist pain. Patient states   Medications patient has been prescribed:   Taking:         Reviewed prior external information including notes and imaging from previsou exam, outside providers and external EMR if available.   As well as notes that were available from care everywhere and other healthcare systems.  Past medical history, social, surgical and family history all reviewed in electronic medical record.  No pertanent information unless stated regarding to the chief complaint.   Past Medical History:  Diagnosis Date  . Kidney stones   . Knee joint pain   . Pars defect of lumbar spine    L5  . Pregnancy induced hypertension   . Syncope     Allergies  Allergen Reactions  . Amoxicillin Hives    Has patient had a PCN reaction causing immediate rash, facial/tongue/throat swelling, SOB or lightheadedness with  hypotension: no Has patient had a PCN reaction causing severe rash involving mucus membranes or skin necrosis: No Has patient had a PCN reaction that required hospitalization No Has patient had a PCN reaction occurring within the last 10 years: No If all of the above answers are "NO", then may proceed with Cephalosporin use.     Review of Systems:  No headache, visual changes, nausea, vomiting, diarrhea, constipation, dizziness, abdominal pain, skin rash, fevers, chills, night sweats, weight loss, swollen lymph nodes, body aches, joint swelling, chest pain, shortness of breath, mood changes. POSITIVE muscle aches  Objective  currently breastfeeding.   General: No apparent distress alert and oriented x3 mood and affect normal, dressed appropriately.  HEENT: Pupils equal, extraocular movements intact  Respiratory: Patient's speak in full sentences and does not appear short of breath  Cardiovascular: No lower extremity edema, non tender, no erythema  Neuro: Cranial nerves II through XII are intact, neurovascularly intact in all extremities with 2+ DTRs and 2+ pulses.  Gait normal with good balance and coordination.  MSK:  Non tender with full range of motion and good stability and symmetric strength and tone of shoulders, elbows, wrist, hip, knee and ankles bilaterally.  Back - Normal skin, Spine with normal alignment and no deformity.  No tenderness to vertebral process palpation.  Paraspinous muscles are not tender and without spasm.   Range of motion is full at neck and lumbar sacral regions  Osteopathic findings  C2 flexed rotated and side bent right C6 flexed rotated and side bent left T3 extended rotated and side bent right inhaled rib T9 extended rotated and side bent left L2 flexed rotated and side bent right Sacrum right on right       Assessment and Plan:    Nonallopathic problems  Decision today to treat with OMT was based on Physical Exam  After verbal consent  patient was treated with HVLA, ME, FPR techniques in cervical, rib, thoracic, lumbar, and sacral  areas  Patient tolerated the procedure well with improvement in symptoms  Patient given exercises, stretches and lifestyle modifications  See medications in patient instructions if given  Patient will follow up in 4-8 weeks      The above documentation has been reviewed and is accurate and complete Wilford Grist       Note: This dictation was prepared with Dragon dictation along with smaller Lobbyist. Any transcriptional errors that result from this process are unintentional.

## 2020-12-21 NOTE — Progress Notes (Signed)
[name] [address] [place] [address] Phone: [phone] Subjective:   Kristin [name], am serving as a scribe for Dr. Antoine Primas. This visit occurred during the SARS-CoV-2 public health emergency.  Safety protocols were in place, including screening questions prior to the visit, additional usage of staff PPE, and extensive cleaning of exam room while observing appropriate contact time as indicated for disinfecting solutions.   I'm seeing this patient by the request  of:  Patient, No Pcp Per (Inactive)  CC: Back and neck pain, wrist pain follow-up  [facility]:[id number]  Kristin [name] is a 35 y.o. female coming in with complaint of back and neck pain. OMT 09/21/2020. Patient states that back is worse than normal since having to cancel a couple appointment. L hip pain started Last Tuesday. Locates pain to L groin area radiating to L lower back. Taking Ibuprofen daily and heat at night. Patient woke up at 2 am March 29th  with a sharp pian in her L hip that radiated all the way down to her foot. Patient also wanting her R wrist cyst drained.   Medications patient has been prescribed: None          Reviewed prior external information including notes and imaging from previsou exam, outside providers and external EMR if available.   As well as notes that were available from care everywhere and other healthcare systems.  Past medical history, social, surgical and family history all reviewed in electronic medical record.  No pertanent information unless stated regarding to the chief complaint.   Past Medical History:  Diagnosis Date  . Kidney stones   . Knee joint pain   . Pars defect of lumbar spine    L5  . Pregnancy induced hypertension   . Syncope     Allergies  Allergen Reactions  . Amoxicillin Hives    Has patient had a PCN reaction causing immediate rash, facial/tongue/throat swelling, SOB or lightheadedness with hypotension:  no Has patient had a PCN reaction causing severe rash involving mucus membranes or skin necrosis: No Has patient had a PCN reaction that required hospitalization No Has patient had a PCN reaction occurring within the last 10 years: No If all of the above answers are "NO", then may proceed with Cephalosporin use.     Review of Systems:  No headache, visual changes, nausea, vomiting, diarrhea, constipation, dizziness, abdominal pain, skin rash, fevers, chills, night sweats, weight loss, swollen lymph nodes,joint swelling, chest pain, shortness of breath, mood changes. POSITIVE muscle aches, body aches  Objective  Blood pressure 130/90, pulse 69, height 5\' 5"  (1.651 m), weight 210 lb (95.3 kg), SpO2 99 %, currently breastfeeding.   General: No apparent distress alert and oriented x3 mood and affect normal, dressed appropriately.  HEENT: Pupils equal, extraocular movements intact  Respiratory: Patient's speak in full sentences and does not appear short of breath  Cardiovascular: No lower extremity edema, non tender, no erythema  Gait normal with good balance and coordination.  MSK:  Non tender with full range of motion and good stability and symmetric strength and tone of shoulders, elbows, wrist, hip, knee and ankles bilaterally.  Back -low back exam does have some loss of lordosis.  Some tenderness to palpation in the paraspinal musculature of the lumbar spine.  Mild increase in tightness with straight leg test bilaterally.  Patient does have pain with extension of the back greater than 10 degrees. Right wrist exam on the volar aspect shows  that patient does have a ganglion cyst noted in that area again.  Procedure: Real-time Ultrasound Guided Injection of right wrist ganglion cyst Device: GE Logiq Q7 Ultrasound guided injection is preferred based studies that show increased duration, increased effect, greater accuracy, decreased procedural pain, increased response rate, and decreased cost  with ultrasound guided versus blind injection.  Verbal informed consent obtained.  Time-out conducted.  Noted no overlying erythema, induration, or other signs of local infection.  Skin prepped in a sterile fashion.  Local anesthesia: Topical Ethyl chloride.  With sterile technique and under real time ultrasound guidance: With an 18-gauge 1 inch needle had difficulty in piercing the skin.  Unfortunately a vein was mildly ruptured.  Needed difficult to see on the ultrasound.  Was unable to get a significant amount of gel but patient did have the cyst no longer being able to be palpated.  Then injected 0.5 cc of 0.5% Marcaine Pain immediately improved suggesting accurate placement of the medication.  Unable to save pictures. Advised to call if fevers/chills, erythema, induration, drainage, or persistent bleeding.  Impression: Technically successful ultrasound guided injection.  Osteopathic findings   T9 extended rotated and side bent left L2 flexed rotated and side bent right Sacrum right on right       Assessment and Plan:  Ganglion cyst of volar aspect of right wrist Had some difficulty with aspiration today.  Hopefully the patient does get some resolve of pain.  Secondary to some of the manipulation that was not done patient given antibiotics in case there is any redness over the course of the weekend.  Patient does have meloxicam for any type of breakthrough pain.  Discussed icing regimen and home exercises, discussed compression.  Patient will likely need surgery for this at some point but once again has had a lot of life changes recently and patient does not feel like she is in the place to be able to do that at the moment.  Pars defect of lumbar spine History of a pars defect.  Patient does have unfortunately what appears to be moreOf a exacerbation.  Discussed with patient icing regimen and home exercises.  Discussed using the meloxicam on a more regular basis as well as the  gabapentin.  Warned of potential side effects.  Responded well to manipulation.  Worsening pain potential and repeat imaging will be necessary.  Follow-up with me again 4 to 6 weeks    Nonallopathic problems  Decision today to treat with OMT was based on Physical Exam  After verbal consent patient was treated with HVLA, ME, FPR techniques in cervical, rib, thoracic, lumbar, and sacral  areas  Patient tolerated the procedure well with improvement in symptoms  Patient given exercises, stretches and lifestyle modifications  See medications in patient instructions if given  Patient will follow up in 4-8 weeks      The above documentation has been reviewed and is accurate and complete [name], DO       Note: This dictation was prepared with Dragon dictation along with smaller phrase technology. Any transcriptional errors that result from this process are unintentional.

## 2020-12-22 ENCOUNTER — Other Ambulatory Visit: Payer: Self-pay

## 2020-12-22 ENCOUNTER — Encounter: Payer: Self-pay | Admitting: Family Medicine

## 2020-12-22 ENCOUNTER — Ambulatory Visit (INDEPENDENT_AMBULATORY_CARE_PROVIDER_SITE_OTHER): Payer: 59 | Admitting: Family Medicine

## 2020-12-22 ENCOUNTER — Ambulatory Visit: Payer: Self-pay

## 2020-12-22 VITALS — BP 130/90 | HR 69 | Ht 65.0 in | Wt 210.0 lb

## 2020-12-22 DIAGNOSIS — M67431 Ganglion, right wrist: Secondary | ICD-10-CM | POA: Diagnosis not present

## 2020-12-22 DIAGNOSIS — M25531 Pain in right wrist: Secondary | ICD-10-CM

## 2020-12-22 DIAGNOSIS — M999 Biomechanical lesion, unspecified: Secondary | ICD-10-CM

## 2020-12-22 DIAGNOSIS — M4306 Spondylolysis, lumbar region: Secondary | ICD-10-CM

## 2020-12-22 MED ORDER — MELOXICAM 7.5 MG PO TABS: 7.5000 mg | ORAL_TABLET | Freq: Every day | ORAL | 0 refills | Status: DC

## 2020-12-22 MED ORDER — DOXYCYCLINE HYCLATE 100 MG PO TABS: ORAL_TABLET | ORAL | 0 refills | Status: DC

## 2020-12-22 MED ORDER — GABAPENTIN 100 MG PO CAPS: ORAL_CAPSULE | ORAL | 0 refills | Status: DC

## 2020-12-22 NOTE — Assessment & Plan Note (Signed)
History of a pars defect.  Patient does have unfortunately what appears to be moreOf a exacerbation.  Discussed with patient icing regimen and home exercises.  Discussed using the meloxicam on a more regular basis as well as the gabapentin.  Warned of potential side effects.  Responded well to manipulation.  Worsening pain potential and repeat imaging will be necessary.  Follow-up with me again 4 to 6 weeks

## 2020-12-22 NOTE — Assessment & Plan Note (Signed)
Had some difficulty with aspiration today.  Hopefully the patient does get some resolve of pain.  Secondary to some of the manipulation that was not done patient given antibiotics in case there is any redness over the course of the weekend.  Patient does have meloxicam for any type of breakthrough pain.  Discussed icing regimen and home exercises, discussed compression.  Patient will likely need surgery for this at some point but once again has had a lot of life changes recently and patient does not feel like she is in the place to be able to do that at the moment.

## 2020-12-22 NOTE — Patient Instructions (Addendum)
Good to see you  Doxycycline 100mg  take 2 a day for 10 days only if needed  Gabapentin 100mg   take 2 daily at bedtime  Meloxicam 7.5 mg take daily do not take any other NSAID while on this medication. Only take antibiotic if you need it If redness happens in the wrist seek medical attention immediately  but you should be fine See me again in 6 weeks

## 2020-12-26 ENCOUNTER — Ambulatory Visit: Payer: 59 | Admitting: Family Medicine

## 2021-01-11 ENCOUNTER — Encounter: Payer: Self-pay | Admitting: Family Medicine

## 2021-01-12 ENCOUNTER — Other Ambulatory Visit: Payer: Self-pay

## 2021-01-12 MED ORDER — CYCLOBENZAPRINE HCL 5 MG PO TABS: 5.0000 mg | ORAL_TABLET | Freq: Every evening | ORAL | 0 refills | Status: DC | PRN

## 2021-01-12 MED ORDER — TIZANIDINE HCL 2 MG PO TABS: 2.0000 mg | ORAL_TABLET | Freq: Every evening | ORAL | 1 refills | Status: DC | PRN

## 2021-01-18 ENCOUNTER — Other Ambulatory Visit: Payer: Self-pay | Admitting: Family Medicine

## 2021-01-24 NOTE — Progress Notes (Signed)
Kristin [name] [address] [place] [address] Phone: [phone] Subjective:   [name], am serving as a scribe for Dr. Antoine Primas. This visit occurred during the SARS-CoV-2 public health emergency.  Safety protocols were in place, including screening questions prior to the visit, additional usage of staff PPE, and extensive cleaning of exam room while observing appropriate contact time as indicated for disinfecting solutions.   I'm seeing this patient by the request  of:  Patient, No Pcp Per (Inactive)  CC: Low back pain follow-up  [id number]  Kristin [name] is a 35 y.o. female coming in with complaint of back and neck pain. OMT 12/22/2020. Also R wrist pain f/u. Patient states that she has been having pain for one month in L glute that radiates down to her foot Did fall due to pain. Is using mm relaxer.   Cyst on palmer aspect of wrist had reaccumulated. Is wearing brace.   Medications patient has been prescribed:   Taking:         Reviewed prior external information including notes and imaging from previsou exam, outside providers and external EMR if available.   As well as notes that were available from care everywhere and other healthcare systems.  Past medical history, social, surgical and family history all reviewed in electronic medical record.  No pertanent information unless stated regarding to the chief complaint.   Past Medical History:  Diagnosis Date  . Kidney stones   . Knee joint pain   . Pars defect of lumbar spine    L5  . Pregnancy induced hypertension   . Syncope     Allergies  Allergen Reactions  . Amoxicillin Hives    Has patient had a PCN reaction causing immediate rash, facial/tongue/throat swelling, SOB or lightheadedness with hypotension: no Has patient had a PCN reaction causing severe rash involving mucus membranes or skin necrosis: No Has patient had a PCN reaction that required  hospitalization No Has patient had a PCN reaction occurring within the last 10 years: No If all of the above answers are "NO", then may proceed with Cephalosporin use.     Review of Systems:  No headache, visual changes, nausea, vomiting, diarrhea, constipation, dizziness, abdominal pain, skin rash, fevers, chills, night sweats, weight loss, swollen lymph nodes, body aches, joint swelling, chest pain, shortness of breath, mood changes. POSITIVE muscle aches  Objective  Blood pressure (!) 148/96, pulse 86, height 5\' 5"  (1.651 m), weight 208 lb (94.3 kg), SpO2 98 %, currently breastfeeding.   General: No apparent distress alert and oriented x3 mood and affect normal, dressed appropriately.  HEENT: Pupils equal, extraocular movements intact  Respiratory: Patient's speak in full sentences and does not appear short of breath  Cardiovascular: No lower extremity edema, non tender, no erythema  Gait normal with good balance and coordination.  MSK:  Non tender with full range of motion and good stability and symmetric strength and tone of shoulders, elbows, wrist, hip, knee and ankles bilaterally.  Back -low back exam shows patient does have severe tenderness to palpation in the gluteal area on the left side.  Patient has a negative but a mild positive straight leg test patient has severe pain with any type of rotation of the back as well with radicular symptoms.  No true masses appreciated.  Limited range of motion of the back with flexion and extension of 10 degrees.       Assessment and Plan:  The above documentation has been reviewed and is accurate and complete Kristin Pulley, DO       Note: This dictation was prepared with Dragon dictation along with smaller phrase technology. Any transcriptional errors that result from this process are unintentional.

## 2021-01-25 ENCOUNTER — Ambulatory Visit (INDEPENDENT_AMBULATORY_CARE_PROVIDER_SITE_OTHER): Payer: 59

## 2021-01-25 ENCOUNTER — Ambulatory Visit (INDEPENDENT_AMBULATORY_CARE_PROVIDER_SITE_OTHER): Payer: 59 | Admitting: Family Medicine

## 2021-01-25 ENCOUNTER — Encounter: Payer: Self-pay | Admitting: Family Medicine

## 2021-01-25 ENCOUNTER — Other Ambulatory Visit: Payer: Self-pay

## 2021-01-25 VITALS — BP 148/96 | HR 86 | Ht 65.0 in | Wt 208.0 lb

## 2021-01-25 DIAGNOSIS — M545 Low back pain, unspecified: Secondary | ICD-10-CM | POA: Diagnosis not present

## 2021-01-25 DIAGNOSIS — M4306 Spondylolysis, lumbar region: Secondary | ICD-10-CM | POA: Diagnosis not present

## 2021-01-25 DIAGNOSIS — M67431 Ganglion, right wrist: Secondary | ICD-10-CM | POA: Diagnosis not present

## 2021-01-25 DIAGNOSIS — G5702 Lesion of sciatic nerve, left lower limb: Secondary | ICD-10-CM

## 2021-01-25 MED ORDER — METHYLPREDNISOLONE ACETATE 80 MG/ML IJ SUSP
80.0000 mg | Freq: Once | INTRAMUSCULAR | Status: AC
  Administered 2021-01-25: 80 mg via INTRAMUSCULAR

## 2021-01-25 MED ORDER — KETOROLAC TROMETHAMINE 60 MG/2ML IM SOLN
60.0000 mg | Freq: Once | INTRAMUSCULAR | Status: AC
  Administered 2021-01-25: 60 mg via INTRAMUSCULAR

## 2021-01-25 MED ORDER — PREDNISONE 20 MG PO TABS: 40.0000 mg | ORAL_TABLET | Freq: Every day | ORAL | 0 refills | Status: DC

## 2021-01-25 NOTE — Patient Instructions (Signed)
Wait on cyst Xray today Pred 40 mg daily for 5 days  Do not use NSAIDS such as Advil or Aleve when taking Pred It is ok to use Tylenol for additional pain relief Send message on Monday, if not better will get MRI See me in 4-6 weeks

## 2021-01-25 NOTE — Assessment & Plan Note (Signed)
Patient is having worsening pain more in the piriformis on the left side.  In addition to this though patient is having radicular symptoms that is consistent with more of a lumbar radiculopathy.  X-rays ordered today with patient having a pars defect.  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  Patient will do her continue the gabapentin as well as the muscle relaxer.  Patient will discontinue the anti-inflammatories and start on prednisone.  Patient will send [place] a message in 1 week and if not better I do feel advanced imaging such as an MRI will be necessary and I would do MRI lumbar and pelvis.  Follow-up with me again 4 to 6 weeks

## 2021-01-25 NOTE — Assessment & Plan Note (Signed)
Patient states is still there at this moment.  Patient will need surgical intervention but is not admitted the right timing.  We will consider repeat aspiration at next follow-up

## 2021-01-30 ENCOUNTER — Other Ambulatory Visit: Payer: Self-pay

## 2021-01-30 MED ORDER — IBUPROFEN 800 MG PO TABS: 800.0000 mg | ORAL_TABLET | Freq: Three times a day (TID) | ORAL | 0 refills | Status: DC | PRN

## 2021-02-15 NOTE — Progress Notes (Signed)
[name] [address] [place] [address] Phone: [phone] Subjective:   I [name] am serving as a Neurosurgeon for Dr. Antoine Primas.  This visit occurred during the SARS-CoV-2 public health emergency.  Safety protocols were in place, including screening questions prior to the visit, additional usage of staff PPE, and extensive cleaning of exam room while observing appropriate contact time as indicated for disinfecting solutions.   CC: Wrist pain and back pain follow-up  [id number]   01/25/2021 Patient is having worsening pain more in the piriformis on the left side.  In addition to this though patient is having radicular symptoms that is consistent with more of a lumbar radiculopathy.  X-rays ordered today with patient having a pars defect.  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  Patient will do her continue the gabapentin as well as the muscle relaxer.  Patient will discontinue the anti-inflammatories and start on prednisone.  Patient will send [place] a message in 1 week and if not better I do feel advanced imaging such as an MRI will be necessary and I would do MRI lumbar and pelvis.  Follow-up with me again 4 to 6 weeks  Patient states is still there at this moment.  Patient will need surgical intervention but is not admitted the right timing.  We will consider repeat aspiration at next follow-up  Update 02/16/2021 CHAVON LUCARELLI is a 35 y.o. female coming in with complaint of right wrist and piriformis pain. Patient states she would like OMT today. Wants the wrist drained.  Has a cyst.  Unable to have it surgically removed secondary to insurance purposes and not being able to be out of work for a significant duration at this time.      Past Medical History:  Diagnosis Date  . Kidney stones   . Knee joint pain   . Pars defect of lumbar spine    L5  . Pregnancy induced hypertension   . Syncope    Past Surgical History:  Procedure  Laterality Date  . EXTRACORPOREAL SHOCK WAVE LITHOTRIPSY Left 01/30/2017   Procedure: LEFT EXTRACORPOREAL SHOCK WAVE LITHOTRIPSY (ESWL);  Surgeon: Malen Gauze, MD;  Location: [facility];  Service: Urology;  Laterality: Left;  . KIDNEY STONE SURGERY     Social History   Socioeconomic History  . Marital status: Married    Spouse name: Camillo Flaming  . Number of children: 2  . Years of education: BA  . Highest education level: Not on file  Occupational History  . Occupation: [employer]  Tobacco Use  . Smoking status: Never Smoker  . Smokeless tobacco: Never Used  Substance and Sexual Activity  . Alcohol use: Yes    Comment: rarely  . Drug use: No  . Sexual activity: Not on file  Other Topics Concern  . Not on file  Social History Narrative   Right handed   No caffeine use   Lives with husband and two kids   Social Determinants of Health   Financial Resource Strain: Not on file  Food Insecurity: Not on file  Transportation Needs: Not on file  Physical Activity: Not on file  Stress: Not on file  Social Connections: Not on file   Allergies  Allergen Reactions  . Amoxicillin Hives    Has patient had a PCN reaction causing immediate rash, facial/tongue/throat swelling, SOB or lightheadedness with hypotension: no Has patient had a PCN reaction causing severe rash involving mucus membranes  or skin necrosis: No Has patient had a PCN reaction that required hospitalization No Has patient had a PCN reaction occurring within the last 10 years: No If all of the above answers are "NO", then may proceed with Cephalosporin use.   Family History  Problem Relation Age of Onset  . CVA Mother   . Hypertension Mother   . Diabetes Mellitus II Mother   . Congestive Heart Failure Mother   . Stroke Mother   . CVA Father   . Hypertension Father   . Diabetes Mellitus II Father   . Heart disease Father   . Stroke Father   . Stroke Sister     Current Outpatient  Medications (Endocrine & Metabolic):  .  predniSONE (DELTASONE) 20 MG tablet, Take 2 tablets (40 mg total) by mouth daily with breakfast.    Current Outpatient Medications (Analgesics):  .  acetaminophen (TYLENOL) 325 MG tablet, Take 325 mg by mouth daily as needed for mild pain or moderate pain. Marland Kitchen  ibuprofen (ADVIL) 800 MG tablet, Take 1 tablet (800 mg total) by mouth every 8 (eight) hours as needed. .  meloxicam (MOBIC) 7.5 MG tablet, TAKE 1 TABLET BY MOUTH EVERY DAY   Current Outpatient Medications (Other):  .  cyclobenzaprine (FLEXERIL) 5 MG tablet, Take 1 tablet (5 mg total) by mouth at bedtime as needed for muscle spasms. Marland Kitchen  doxycycline (VIBRA-TABS) 100 MG tablet, Take 2 tablets by mouth twice daily for ten days .  gabapentin (NEURONTIN) 100 MG capsule, Take 2 Capsules daily at bed time .  traZODone (DESYREL) 100 MG tablet, TAKE 1 TABLET BY MOUTH EVERYDAY AT BEDTIME .  Vitamin D, Ergocalciferol, (DRISDOL) 1.25 MG (50000 UNIT) CAPS capsule, Take 1 capsule (50,000 Units total) by mouth every 7 (seven) days.   Reviewed prior external information including notes and imaging from  primary care provider As well as notes that were available from care everywhere and other healthcare systems.  Past medical history, social, surgical and family history all reviewed in electronic medical record.  No pertanent information unless stated regarding to the chief complaint.   Review of Systems:  No headache, visual changes, nausea, vomiting, diarrhea, constipation, dizziness, abdominal pain, skin rash, fevers, chills, night sweats, weight loss, swollen lymph nodes, body aches, joint swelling, chest pain, shortness of breath, mood changes. POSITIVE muscle aches  Objective  Blood pressure (!) 152/90, pulse 99, height 5\' 5"  (1.651 m), weight 210 lb (95.3 kg), last menstrual period 01/23/2021, SpO2 100 %, currently breastfeeding.   General: No apparent distress alert and oriented x3 mood and affect  normal, dressed appropriately.  HEENT: Pupils equal, extraocular movements intact  Respiratory: Patient's speak in full sentences and does not appear short of breath  Cardiovascular: No lower extremity edema, non tender, no erythema  Gait normal with good balance and coordination.  MSK:   Right wrist exam does have a cyst noted of the dorsal aspect of the wrist.  Nontender to palpation in this area. Syndrome larger than previous exam. Back exam does have some mild loss of lordosis.  Tender to palpation of the paraspinal musculature right greater than left.  Worsening pain with extension of the back.  Osteopathic findings C5 flexed rotated and side bent right T8 extended rotated and side bent right with inhaled rib L2 flexed rotated and side bent left Sacrum left on left  Procedure: Real-time Ultrasound Guided Injection of right ganglion cyst of volar wrist Device: GE Logiq Q7 Ultrasound guided injection is preferred based  studies that show increased duration, increased effect, greater accuracy, decreased procedural pain, increased response rate, and decreased cost with ultrasound guided versus blind injection.  Verbal informed consent obtained.  Time-out conducted.  Noted no overlying erythema, induration, or other signs of local infection.  Skin prepped in a sterile fashion.  Local anesthesia: Topical Ethyl chloride.  With sterile technique and under real time ultrasound guidance: With an 18-gauge 1-1/2 inch needle patient was injected with 0.5 cc of 0.5% Marcaine and then aspiration and gel like fluid noted.  We did injected his injected 0.5 cc of 40 mg/mL of Kenalog. Completed without difficulty  Pain immediately improved suggesting accurate placement of the medication.  Advised to call if fevers/chills, erythema, induration, drainage, or persistent bleeding.  Impression: Technically successful ultrasound guided injection.   Impression and Recommendations:     The above  documentation has been reviewed and is accurate and complete [name], DO

## 2021-02-16 ENCOUNTER — Encounter: Payer: Self-pay | Admitting: Family Medicine

## 2021-02-16 ENCOUNTER — Other Ambulatory Visit: Payer: Self-pay

## 2021-02-16 ENCOUNTER — Ambulatory Visit (INDEPENDENT_AMBULATORY_CARE_PROVIDER_SITE_OTHER): Payer: 59 | Admitting: Family Medicine

## 2021-02-16 ENCOUNTER — Ambulatory Visit: Payer: Self-pay

## 2021-02-16 VITALS — BP 152/90 | HR 99 | Ht 65.0 in | Wt 210.0 lb

## 2021-02-16 DIAGNOSIS — M9901 Segmental and somatic dysfunction of cervical region: Secondary | ICD-10-CM

## 2021-02-16 DIAGNOSIS — M67431 Ganglion, right wrist: Secondary | ICD-10-CM | POA: Diagnosis not present

## 2021-02-16 DIAGNOSIS — M9904 Segmental and somatic dysfunction of sacral region: Secondary | ICD-10-CM

## 2021-02-16 DIAGNOSIS — M4306 Spondylolysis, lumbar region: Secondary | ICD-10-CM

## 2021-02-16 DIAGNOSIS — M25531 Pain in right wrist: Secondary | ICD-10-CM

## 2021-02-16 DIAGNOSIS — M9908 Segmental and somatic dysfunction of rib cage: Secondary | ICD-10-CM

## 2021-02-16 DIAGNOSIS — M9902 Segmental and somatic dysfunction of thoracic region: Secondary | ICD-10-CM

## 2021-02-16 DIAGNOSIS — M9903 Segmental and somatic dysfunction of lumbar region: Secondary | ICD-10-CM | POA: Diagnosis not present

## 2021-02-16 DIAGNOSIS — M999 Biomechanical lesion, unspecified: Secondary | ICD-10-CM

## 2021-02-16 NOTE — Assessment & Plan Note (Signed)

## 2021-02-16 NOTE — Assessment & Plan Note (Signed)
Had difficulty with the aspiration last time but this time significantly improved.  Patient still wants to hold on any type of surgical intervention until she has the other insurance.  Discussed with her at that time she should consider it.  This is somewhat difficult to do secondary to the overlying blood vessel.  Patient noted to have some mild blood loss but nothing severe.  Discussed with patient that if any erythema or redness to seek medical attention over the weekend.  Follow-up with me again in 6 to 8 weeks

## 2021-02-16 NOTE — Patient Instructions (Addendum)
Good to see you Drained wrist cyst today Can remove compression in 15 minutes if you want Any redness or swelling seek medical attention Keep working on the back Have fun camping See me again in 6-8 weeks

## 2021-02-16 NOTE — Assessment & Plan Note (Signed)
The patient does have a pars defect in the back but is doing well.  Discussed which activities to do which wants to avoid.  Responding very well though to osteopathic manipulation.  Can have epidurals again if necessary.  Medication wise can continue the gabapentin.  Encouraged her to continue to work on her weight which she is doing better with work.  Follow-up with me again in 6 to 8 weeks

## 2021-02-28 ENCOUNTER — Other Ambulatory Visit: Payer: Self-pay | Admitting: Family Medicine

## 2021-03-06 ENCOUNTER — Ambulatory Visit: Payer: 59 | Admitting: Family Medicine

## 2021-04-11 NOTE — Progress Notes (Deleted)
[name] [address] [place] [address] Phone: [phone] Subjective:    I'm seeing this patient by the request  of:  Patient, No Pcp Per (Inactive)  CC: back and neck pain   [id number]  02/16/2021 Had difficulty with the aspiration last time but this time significantly improved.  Patient still wants to hold on any type of surgical intervention until she has the other insurance.  Discussed with her at that time she should consider it.  This is somewhat difficult to do secondary to the overlying blood vessel.  Patient noted to have some mild blood loss but nothing severe.  Discussed with patient that if any erythema or redness to seek medical attention over the weekend.  Follow-up with me again in 6 to 8 weeks  The patient does have a pars defect in the back but is doing well.  Discussed which activities to do which wants to avoid.  Responding very well though to osteopathic manipulation.  Can have epidurals again if necessary.  Medication wise can continue the gabapentin.  Encouraged her to continue to work on her weight which she is doing better with work.  Follow-up with me again in 6 to 8 weeks  Update 04/16/2021 Kristin [name] is a 35 y.o. female coming in with complaint of back and neck pain. OMT 02/16/2021. Patient states   Medications patient has been prescribed:   Taking:         Past Medical History:  Diagnosis Date   Kidney stones    Knee joint pain    Pars defect of lumbar spine    L5   Pregnancy induced hypertension    Syncope     Allergies  Allergen Reactions   Amoxicillin Hives    Has patient had a PCN reaction causing immediate rash, facial/tongue/throat swelling, SOB or lightheadedness with hypotension: no Has patient had a PCN reaction causing severe rash involving mucus membranes or skin necrosis: No Has patient had a PCN reaction that required hospitalization No Has patient had a PCN reaction occurring within  the last 10 years: No If all of the above answers are "NO", then may proceed with Cephalosporin use.     Review of Systems:  No headache, visual changes, nausea, vomiting, diarrhea, constipation, dizziness, abdominal pain, skin rash, fevers, chills, night sweats, weight loss, swollen lymph nodes, body aches, joint swelling, chest pain, shortness of breath, mood changes. POSITIVE muscle aches  Objective  currently breastfeeding.   General: No apparent distress alert and oriented x3 mood and affect normal, dressed appropriately.  HEENT: Pupils equal, extraocular movements intact  Respiratory: Patient's speak in full sentences and does not appear short of breath  Cardiovascular: No lower extremity edema, non tender, no erythema  Neuro: Cranial nerves II through XII are intact, neurovascularly intact in all extremities with 2+ DTRs and 2+ pulses.  Gait normal with good balance and coordination.  MSK:  Non tender with full range of motion and good stability and symmetric strength and tone of shoulders, elbows, wrist, hip, knee and ankles bilaterally.  Back - Normal skin, Spine with normal alignment and no deformity.  No tenderness to vertebral process palpation.  Paraspinous muscles are not tender and without spasm.   Range of motion is full at neck and lumbar sacral regions  Osteopathic findings  C2 flexed rotated and side bent right C6 flexed rotated and side bent left T3 extended rotated and side bent right inhaled rib T9 extended rotated and  side bent left L2 flexed rotated and side bent right Sacrum right on right     Assessment and Plan:  No problem-specific Assessment & Plan notes found for this encounter.   Nonallopathic problems  Decision today to treat with OMT was based on Physical Exam  After verbal consent patient was treated with HVLA, ME, FPR techniques in cervical, rib, thoracic, lumbar, and sacral  areas  Patient tolerated the procedure well with improvement in  symptoms  Patient given exercises, stretches and lifestyle modifications  See medications in patient instructions if given  Patient will follow up in 4-8 weeks      The above documentation has been reviewed and is accurate and complete [name], DO        Note: This dictation was prepared with Dragon dictation along with smaller phrase technology. Any transcriptional errors that result from this process are unintentional.

## 2021-04-16 ENCOUNTER — Ambulatory Visit: Payer: Medicaid Other | Admitting: Family Medicine

## 2021-06-06 ENCOUNTER — Ambulatory Visit: Payer: Self-pay | Admitting: Neurology

## 2021-06-07 ENCOUNTER — Encounter: Payer: Self-pay | Admitting: Neurology

## 2022-01-23 ENCOUNTER — Encounter: Payer: Self-pay | Admitting: Family Medicine

## 2022-03-08 NOTE — Progress Notes (Unsigned)
[name] [address] [place] [address] Phone: [phone] Subjective:   Kristin [name], am serving as a scribe for Dr. Antoine [name].  I'm seeing this patient by the request  of:  Patient, No Pcp Per  CC: back and neck pain   [id number]  Kristin [name] is a 36 y.o. female coming in with complaint of back and neck pain. OMT on 02/16/2021. Patient states here for manipulation and check on cyst in wrist.  Medications patient has been prescribed:   Taking:         Reviewed prior external information including notes and imaging from previsou exam, outside providers and external EMR if available.   As well as notes that were available from care everywhere and other healthcare systems.  Past medical history, social, surgical and family history all reviewed in electronic medical record.  No pertanent information unless stated regarding to the chief complaint.   Past Medical History:  Diagnosis Date   Kidney stones    Knee joint pain    Pars defect of lumbar spine    L5   Pregnancy induced hypertension    Syncope     Allergies  Allergen Reactions   Amoxicillin Hives    Has patient had a PCN reaction causing immediate rash, facial/tongue/throat swelling, SOB or lightheadedness with hypotension: no Has patient had a PCN reaction causing severe rash involving mucus membranes or skin necrosis: No Has patient had a PCN reaction that required hospitalization No Has patient had a PCN reaction occurring within the last 10 years: No If all of the above answers are "NO", then may proceed with Cephalosporin use.     Review of Systems:  No headache, visual changes, nausea, vomiting, diarrhea, constipation, dizziness, abdominal pain, skin rash, fevers, chills, night sweats, weight loss, swollen lymph nodes, body aches, joint swelling, chest pain, shortness of breath, mood changes. POSITIVE muscle aches  Objective  Blood pressure (!)  142/92, pulse 68, height 5\' 5"  (1.651 m), weight 222 lb (100.7 kg), SpO2 96 %, currently breastfeeding.   General: No apparent distress alert and oriented x3 mood and affect normal, dressed appropriately.  HEENT: Pupils equal, extraocular movements intact  Respiratory: Patient's speak in full sentences and does not appear short of breath  Cardiovascular: No lower extremity edema, non tender, no erythema  Gait MSK:  Back back exam does have some loss lordosis.  Some tightness noted in the FABER test right greater than left.  Patient does still have tightness.  Lower back in the sacroiliac joint as well. Limited extension of the back noted.  Right wrist exam still has ganglion cyst on the volar aspect  Limited muscular skeletal ultrasound was performed and interpreted by , M  Limited ultrasound of patient's wrist does show a hypoechoic change of the ganglion cyst still significantly larger at the time.  This is nearly the same size as previously.  Occasionally gives him close proximity to the radial artery.  Patient does have actually a numbness noted in the right lung and the ulnar aspect of his right wrist.  Osteopathic findings  C2 flexed rotated and side bent right C6 flexed rotated and side bent left T3 extended rotated and side bent right inhaled rib T9 extended rotated and side bent left L3 flexed rotated and side bent right Sacrum right on right       Assessment and Plan:  White matter abnormality on MRI of brain Patient may have multiple sclerosis  noted.  Still seems restless.  I do not think certainly did give patient some prednisone to try in 5 days to see if that makes any significant improvement.  The possibility of a nerve conduction test.  Follow-up 6 to 8 weeks  Ganglion cyst of volar aspect of right wrist Stable at the moment.  Since we have tried aspiration again.  Still holding on any surgical intervention.  Discussed which activities that could  cause repetitive activities I would like.  Follow-up with me again 6 to 8 weeks    Nonallopathic problems  Decision today to treat with OMT was based on Physical Exam  After verbal consent patient was treated with HVLA, ME, FPR techniques in cervical, rib, thoracic, lumbar, and sacral  areas  Patient tolerated the procedure well with improvement in symptoms  Patient given exercises, stretches and lifestyle modifications  See medications in patient instructions if given  Patient will follow up in 4-8 weeks     The above documentation has been reviewed and is accurate and complete [name], DO         Note: This dictation was prepared with Dragon dictation along with smaller phrase technology. Any transcriptional errors that result from this process are unintentional.

## 2022-03-09 IMAGING — MR MR THORACIC SPINE WO/W CM
4 of 8 series · 23 of 48 positions shown · IV contrast (gadavist)
Comparison: No pertinent prior exams are available for comparison.

CLINICAL DATA: Demyelinating disease, new event.

EXAM:
MRI THORACIC WITHOUT AND WITH CONTRAST
TECHNIQUE: Multiplanar and multiecho pulse sequences of the thoracic spine were
obtained without and with intravenous contrast.
CONTRAST:  10mL GADAVIST GADOBUTROL 1 MMOL/ML IV SOLN

[Series 23: T2 · sagittal · 3.0mm · 0.76mm/px · 4 of 19 slices shown (1 of 2)]
[im 1/19]
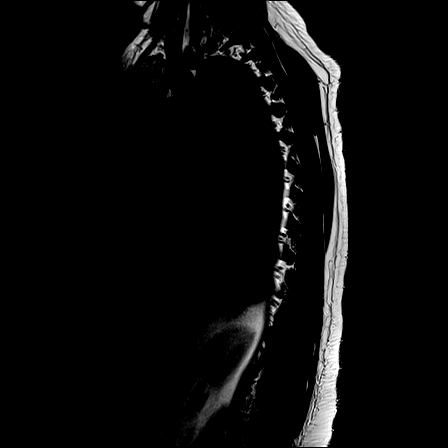
[im 7/19]
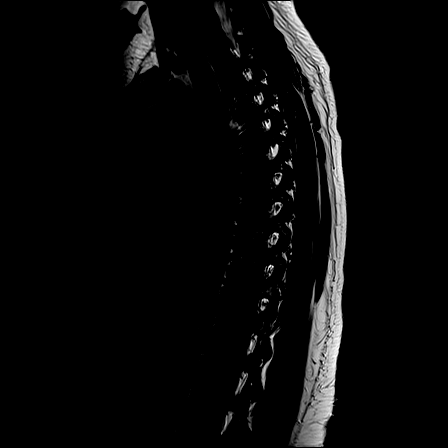
[im 13/19]
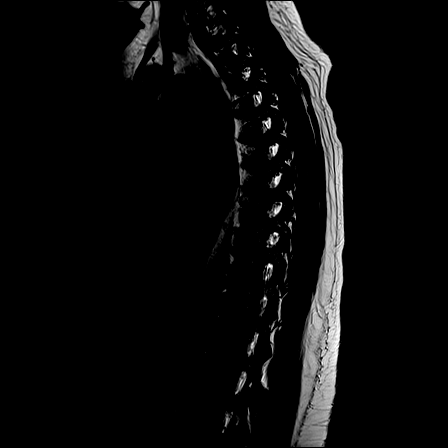
[im 19/19]
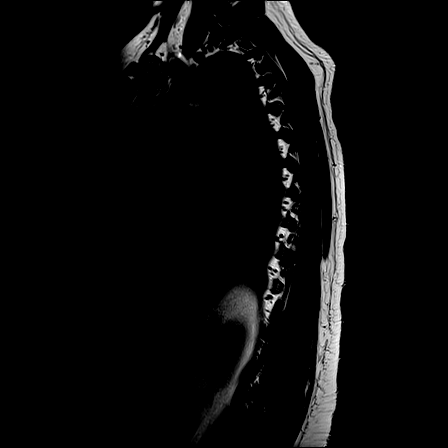

[Series 24: T1 · sagittal · 3.0mm · 0.76mm/px · 4 of 19 slices shown (1 of 2)]
[im 1/19]
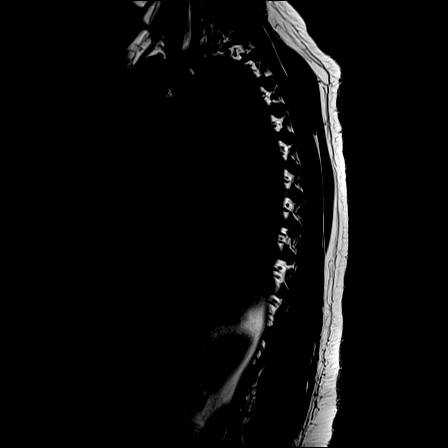
[im 7/19]
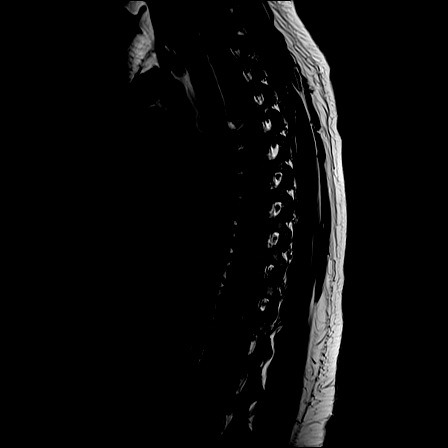
[im 13/19]
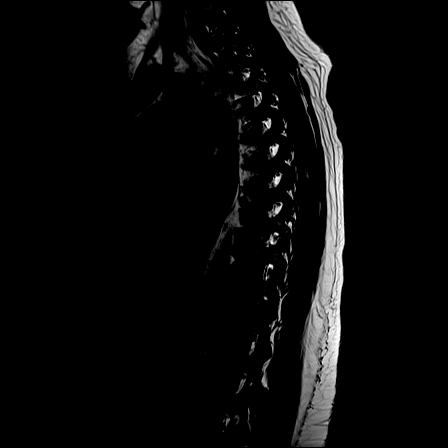
[im 19/19]
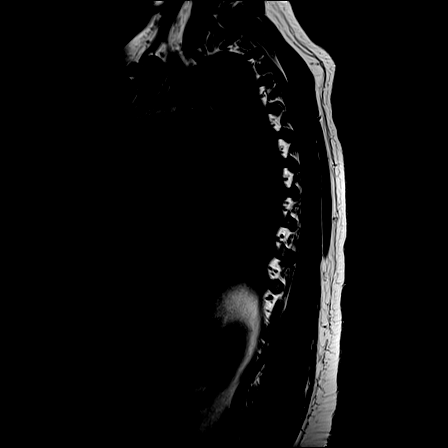

[Series 26: T2 · axial · 4.0mm · 0.59mm/px · z∈[-369,-154]mm · 8 of 36 slices shown (2 of 2)]
[im 1/36]
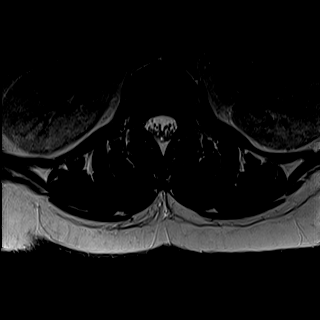
[im 6/36]
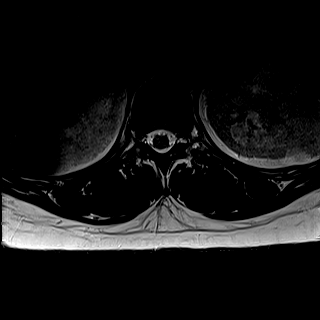
[im 11/36]
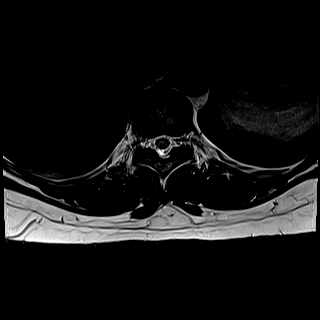
[im 16/36]
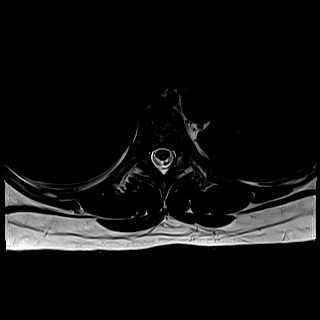
[im 21/36]
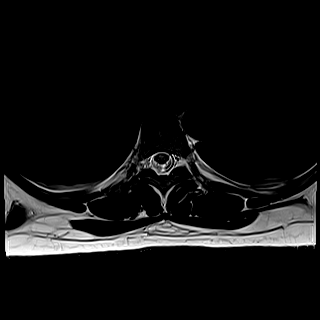
[im 26/36]
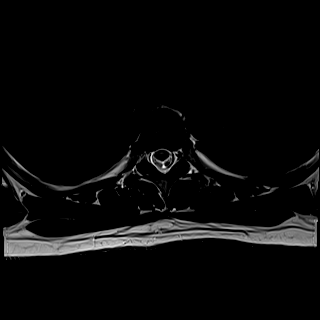
[im 31/36]
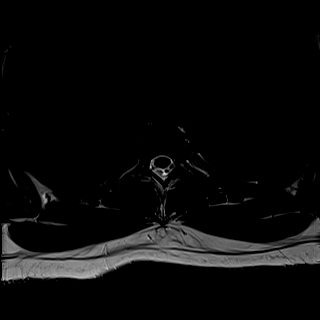
[im 36/36]
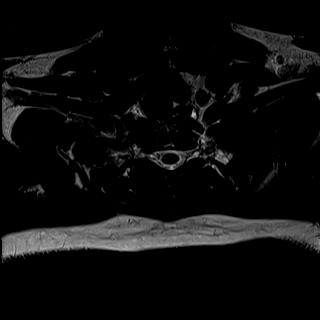

[Series 28: T1 · axial · non-contrast · 4.0mm · 0.31mm/px · z∈[-369,-183]mm · 7 of 36 slices shown (2 of 2)]
[im 1/36]
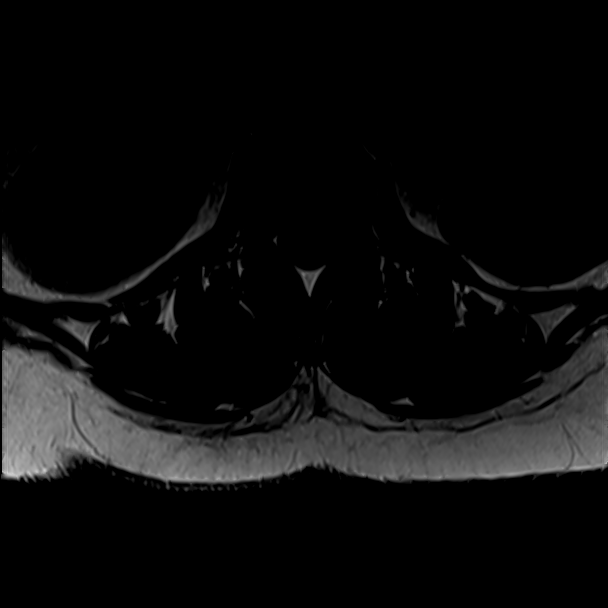
[im 6/36]
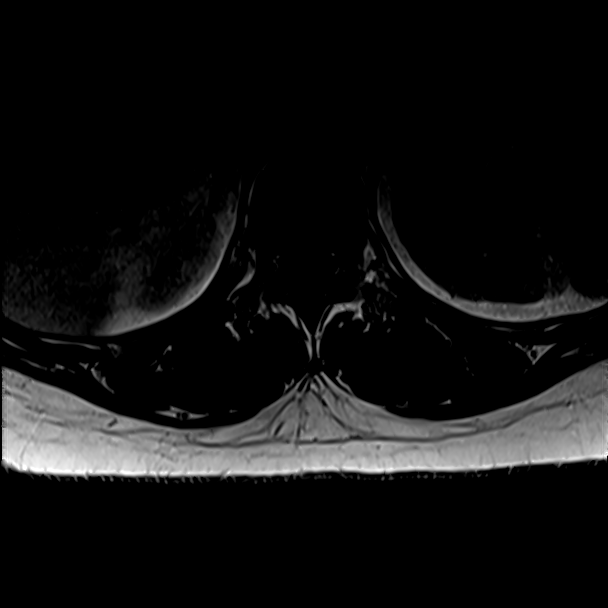
[im 11/36]
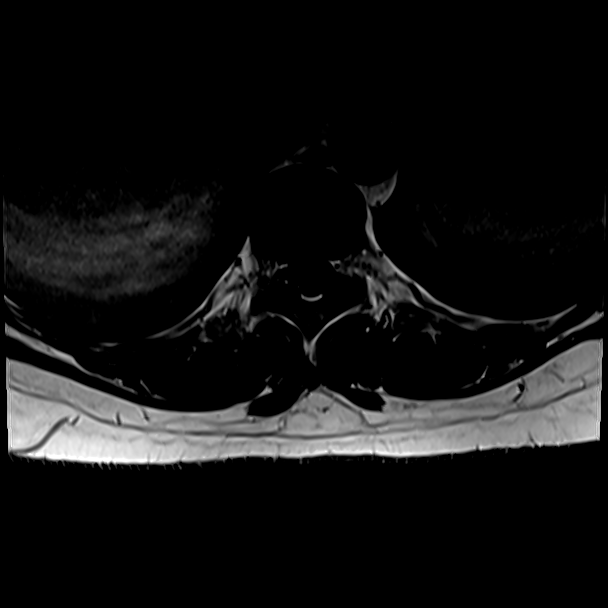
[im 16/36]
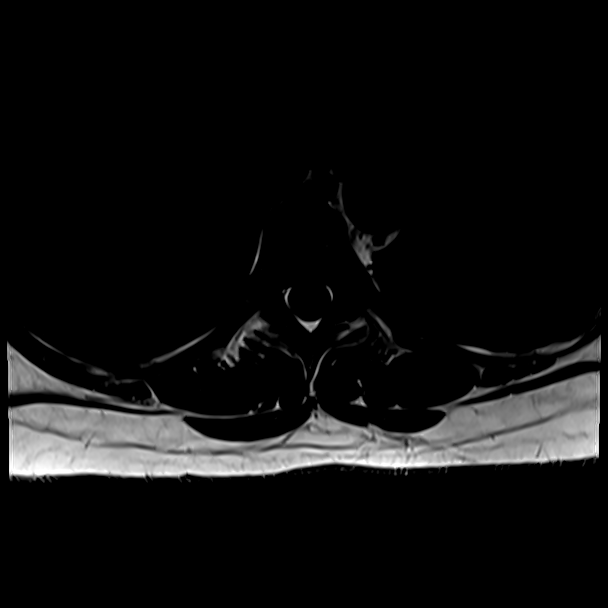
[im 21/36]
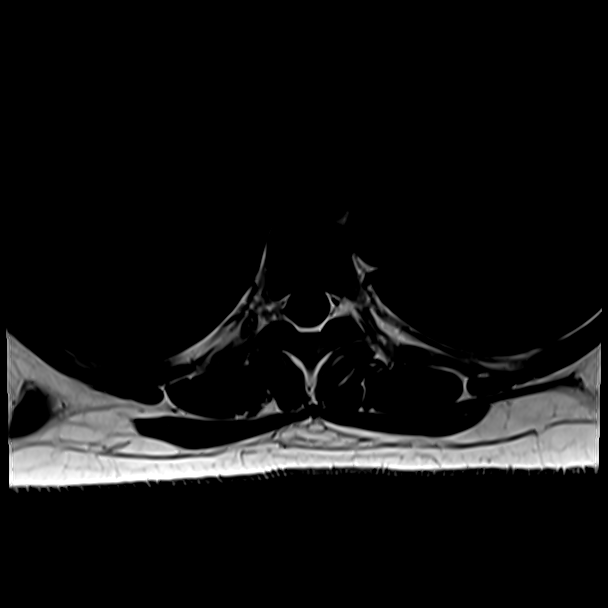
[im 26/36]
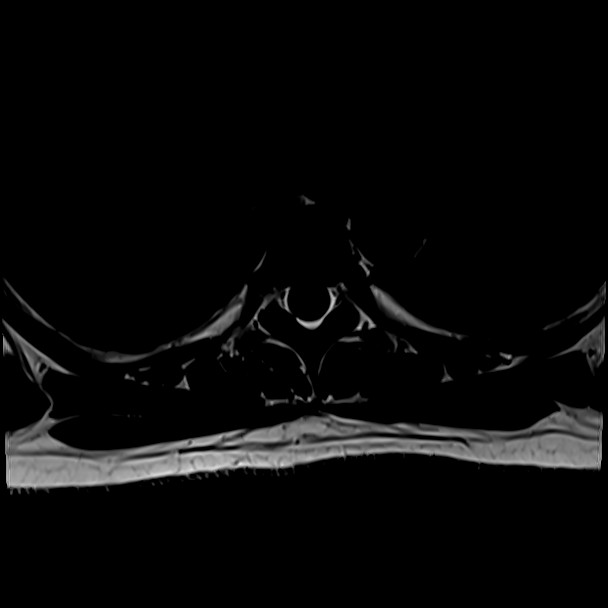
[im 31/36]
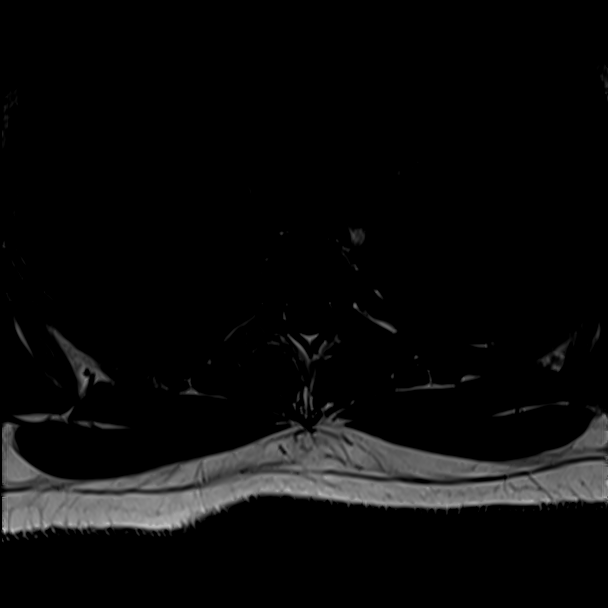

[23 of 48 positions shown; findings below may reference images not displayed]

FINDINGS: MRI THORACIC SPINE FINDINGS

Alignment:  No significant spondylolisthesis.

Vertebrae: Vertebral body height is maintained. No marrow edema or
focal suspicious osseous lesion. No abnormal osseous enhancement

Cord: A short-segment focus of abnormal T2 hyperintensity is
questioned within the spinal cord at the T4-T5 level. However, this
focus is only appreciated on the sagittal T2 weighted sequence and
could potentially be artifactual (series 23, image 10). No abnormal
cord enhancement is identified.

Paraspinal and other soft tissues: No abnormality identified within
included portions of the thorax or upper abdomen/retroperitoneum.

Disc levels:

Intervertebral disc height and hydration are maintained. No
significant disc herniation, spinal canal stenosis or neural
foraminal narrowing at any level.
IMPRESSION: A short-segment focus of T2 hyperintense signal abnormality is
questioned within the spinal cord at the T4-T5 level. However, this
finding is only appreciated on the sagittal T2 weighted sequence and
could simply reflect artifact. If this is a real finding, sequela of
demyelinating disease is a differential consideration. No abnormal
enhancement to suggest active demyelination.

No significant disc herniation, spinal canal stenosis or neural
foraminal narrowing at any level.

## 2022-03-11 ENCOUNTER — Ambulatory Visit (INDEPENDENT_AMBULATORY_CARE_PROVIDER_SITE_OTHER): Payer: Self-pay | Admitting: Family Medicine

## 2022-03-11 ENCOUNTER — Ambulatory Visit: Payer: Self-pay

## 2022-03-11 VITALS — BP 142/92 | HR 68 | Ht 65.0 in | Wt 222.0 lb

## 2022-03-11 DIAGNOSIS — M9904 Segmental and somatic dysfunction of sacral region: Secondary | ICD-10-CM

## 2022-03-11 DIAGNOSIS — R202 Paresthesia of skin: Secondary | ICD-10-CM

## 2022-03-11 DIAGNOSIS — R200 Anesthesia of skin: Secondary | ICD-10-CM | POA: Insufficient documentation

## 2022-03-11 DIAGNOSIS — M9902 Segmental and somatic dysfunction of thoracic region: Secondary | ICD-10-CM

## 2022-03-11 DIAGNOSIS — M9903 Segmental and somatic dysfunction of lumbar region: Secondary | ICD-10-CM

## 2022-03-11 DIAGNOSIS — M25531 Pain in right wrist: Secondary | ICD-10-CM

## 2022-03-11 DIAGNOSIS — M9901 Segmental and somatic dysfunction of cervical region: Secondary | ICD-10-CM

## 2022-03-11 DIAGNOSIS — R9082 White matter disease, unspecified: Secondary | ICD-10-CM

## 2022-03-11 DIAGNOSIS — M67431 Ganglion, right wrist: Secondary | ICD-10-CM

## 2022-03-11 DIAGNOSIS — M9908 Segmental and somatic dysfunction of rib cage: Secondary | ICD-10-CM

## 2022-03-11 MED ORDER — PREDNISONE 20 MG PO TABS: 40.0000 mg | ORAL_TABLET | Freq: Every day | ORAL | 0 refills | Status: DC

## 2022-03-11 NOTE — Assessment & Plan Note (Signed)
Numbness on the ulnar aspect. Could this be consistent with some Minor changes seen previously.  Prednisone given at the moment we will see how patient responds.  Follow-up with me again 6 to 8 weeks otherwise.

## 2022-03-11 NOTE — Assessment & Plan Note (Signed)
Patient may have multiple sclerosis noted.  Still seems restless.  I do not think certainly did give patient some prednisone to try in 5 days to see if that makes any significant improvement.  The possibility of a nerve conduction test.  Follow-up 6 to 8 weeks

## 2022-03-11 NOTE — Assessment & Plan Note (Signed)
Stable at the moment.  Since we have tried aspiration again.  Still holding on any surgical intervention.  Discussed which activities that could cause repetitive activities I would like.  Follow-up with me again 6 to 8 weeks

## 2022-03-11 NOTE — Patient Instructions (Addendum)
Prescription filled Started manipulation again should be fine See you again in 6-8 weeks

## 2022-04-19 NOTE — Progress Notes (Unsigned)
Kristin [name] [address] [place] [address] Phone: [phone] Subjective:   Kristin [name], am serving as a scribe for Dr. Antoine Primas.  I'm seeing this patient by the request  of:  Patient, No Pcp Per  CC: neck and back pain   [id number]  Kristin [name] is a 36 y.o. female coming in with complaint of back and neck pain. OMT on 03/11/2022. Did aspiration of cyst on wrist last visit. Patient states same per usual. Also f/u on wrist. No new issues.  Medications patient has been prescribed: prednisone  Taking:       Reviewed prior external information including notes and imaging from previsou exam, outside providers and external EMR if available.   As well as notes that were available from care everywhere and other healthcare systems.  Past medical history, social, surgical and family history all reviewed in electronic medical record.  No pertanent information unless stated regarding to the chief complaint.   Past Medical History:  Diagnosis Date   Kidney stones    Knee joint pain    Pars defect of lumbar spine    L5   Pregnancy induced hypertension    Syncope     Allergies  Allergen Reactions   Amoxicillin Hives    Has patient had a PCN reaction causing immediate rash, facial/tongue/throat swelling, SOB or lightheadedness with hypotension: no Has patient had a PCN reaction causing severe rash involving mucus membranes or skin necrosis: No Has patient had a PCN reaction that required hospitalization No Has patient had a PCN reaction occurring within the last 10 years: No If all of the above answers are "NO", then may proceed with Cephalosporin use.     Review of Systems:  No headache, visual changes, nausea, vomiting, diarrhea, constipation, dizziness, abdominal pain, skin rash, fevers, chills, night sweats, weight loss, swollen lymph nodes, body aches, joint swelling, chest pain, shortness of breath, mood changes. POSITIVE  muscle aches  Objective  Blood pressure 134/84, pulse 73, height 5\' 5"  (1.651 m), weight 226 lb (102.5 kg), SpO2 99 %, currently breastfeeding.   General: No apparent distress alert and oriented x3 mood and affect normal, dressed appropriately.  HEENT: Pupils equal, extraocular movements intact  Respiratory: Patient's speak in full sentences and does not appear short of breath  Cardiovascular: No lower extremity edema, non tender, no erythema  Gait MSK:  Back low back does have some tenderness noted in the paraspinal musculature.  Seems to be right greater than left.  Patient also has some tenderness noted in the paraspinal musculature at the thoracolumbar juncture.  Right wrist exam shows the patient does have a ganglion cyst and noted again.  Limited muscular skeletal ultrasound was performed and interpreted by , M  Limited ultrasound still shows the ganglion cyst but does have a significant close proximity to the radial artery noted.  Does not seem to be pushing on anything or occluding anything at this moment. Impression: Continued ganglion cyst of wrist  Osteopathic findings  C2 flexed rotated and side bent right C5 flexed rotated and side bent left T3 extended rotated and side bent right inhaled rib T5 extended rotated and side bent left L2 flexed rotated and side bent right Sacrum right on right     Assessment and Plan:  SI (sacroiliac) joint dysfunction Tenderness to palpation in the paraspinal musculature.  We will discuss home exercises and icing regimen.  Still responds well to manipulation.  Tightness noted more  in the neck aspect.  Does have a pars defect in the back and we will continue to monitor.  Has responded to medicines such as the cyclobenzaprine and the gabapentin previously.  We will continue to monitor and follow-up again in 4 to 6 weeks    Nonallopathic problems  Decision today to treat with OMT was based on Physical Exam  After verbal  consent patient was treated with HVLA, ME, FPR techniques in cervical, rib, thoracic, lumbar, and sacral  areas  Patient tolerated the procedure well with improvement in symptoms  Patient given exercises, stretches and lifestyle modifications  See medications in patient instructions if given  Patient will follow up in 4-8 weeks      The above documentation has been reviewed and is accurate and complete Kristin Saa, DO        Note: This dictation was prepared with Dragon dictation along with smaller phrase technology. Any transcriptional errors that result from this process are unintentional.

## 2022-04-22 ENCOUNTER — Ambulatory Visit: Payer: Self-pay

## 2022-04-22 ENCOUNTER — Ambulatory Visit (INDEPENDENT_AMBULATORY_CARE_PROVIDER_SITE_OTHER): Payer: Self-pay | Admitting: Family Medicine

## 2022-04-22 ENCOUNTER — Encounter: Payer: Self-pay | Admitting: Family Medicine

## 2022-04-22 VITALS — BP 134/84 | HR 73 | Ht 65.0 in | Wt 226.0 lb

## 2022-04-22 DIAGNOSIS — M9901 Segmental and somatic dysfunction of cervical region: Secondary | ICD-10-CM

## 2022-04-22 DIAGNOSIS — M67431 Ganglion, right wrist: Secondary | ICD-10-CM

## 2022-04-22 DIAGNOSIS — M25531 Pain in right wrist: Secondary | ICD-10-CM

## 2022-04-22 DIAGNOSIS — M9903 Segmental and somatic dysfunction of lumbar region: Secondary | ICD-10-CM

## 2022-04-22 DIAGNOSIS — M9908 Segmental and somatic dysfunction of rib cage: Secondary | ICD-10-CM

## 2022-04-22 DIAGNOSIS — M533 Sacrococcygeal disorders, not elsewhere classified: Secondary | ICD-10-CM

## 2022-04-22 DIAGNOSIS — M9904 Segmental and somatic dysfunction of sacral region: Secondary | ICD-10-CM

## 2022-04-22 DIAGNOSIS — M9902 Segmental and somatic dysfunction of thoracic region: Secondary | ICD-10-CM

## 2022-04-22 NOTE — Assessment & Plan Note (Signed)
Tenderness to palpation in the paraspinal musculature.  We will discuss home exercises and icing regimen.  Still responds well to manipulation.  Tightness noted more in the neck aspect.  Does have a pars defect in the back and we will continue to monitor.  Has responded to medicines such as the cyclobenzaprine and the gabapentin previously.  We will continue to monitor and follow-up again in 4 to 6 weeks

## 2022-04-22 NOTE — Patient Instructions (Signed)
Good to see you! Continue to monitor wrist See you again in 6 weeks

## 2022-04-22 NOTE — Assessment & Plan Note (Signed)
Beginning to get larger again.  Noted over a liter since the aspiration was done.  Very difficult area to do aspiration secondary to blood vessel.  Will need to wait until it is bigger for [place] to be able to do anything else.

## 2022-06-14 NOTE — Progress Notes (Unsigned)
Kristin [name] Phone: [phone] Subjective:   Kristin [name], am serving as a scribe for Dr. Hulan [name].  I'm seeing this patient by the request  of:  Patient, [name] Pcp Per  CC: Low back and neck pain follow-up  [id number]  Kristin [name] is a 36 y.o. female coming in with complaint of back and neck pain. OMT on 04/22/2022. Also seen for wrist pain. 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 Pain will radiate down the L leg but mostly be in lumbar spine. Using Tylenol for pain.   Patient hit wrist on something hard and felt a pop in wrist. Feels like cyst broke up. This happened in August.   Medications patient has been prescribed: None  Taking:         Reviewed prior external information including notes and imaging from previsou exam, outside providers and external EMR if available.   As well as notes that were available from care everywhere and other healthcare systems.  Past medical history, social, surgical and family history all reviewed in electronic medical record.  [name] pertanent information unless stated regarding to the chief complaint.   Past Medical History:  Diagnosis Date   Kidney stones    Knee joint pain    Pars defect of lumbar spine    L5   Pregnancy induced hypertension    Syncope     Allergies  Allergen Reactions   Amoxicillin Hives    Has patient had a PCN reaction causing immediate rash, facial/tongue/throat swelling, SOB or lightheadedness with hypotension: [name] Has patient had a PCN reaction causing severe rash involving mucus membranes or skin necrosis: [name] Has patient had a PCN reaction that required hospitalization [name] Has patient had a PCN reaction occurring within the last 10 years: [name] If all of the above answers are "[name]", then may proceed with Cephalosporin use.     Review of Systems:  [name] headache, visual changes, nausea,  vomiting, diarrhea, constipation, dizziness, abdominal pain, skin rash, fevers, chills, night sweats, weight loss, swollen lymph nodes, body aches, joint swelling, chest pain, shortness of breath, mood changes. POSITIVE muscle aches  Objective  Blood pressure (!) 142/102, pulse 71, height 5\' 5"  (1.651 m), weight 234 lb (106.1 kg), SpO2 99 %, currently breastfeeding.   General: [name] apparent distress alert and oriented x3 mood and affect normal, dressed appropriately.  HEENT: Pupils equal, extraocular movements intact  Respiratory: Patient's speak in full sentences and does not appear short of breath  Cardiovascular: [name] lower extremity edema, non tender, [name] erythema  Gait MSK: Wrist exam on the right side shows the patient does have mild fullness noted on the palmar aspect near the radial side of the wrist.  Good grip strength noted. Back does have loss of lordosis.  Tenderness to palpation of the paraspinal musculature.  Osteopathic findings  C2 flexed rotated and side bent right C5 flexed rotated and side bent left T3 extended rotated and side bent right inhaled rib T9 extended rotated and side bent left L2 flexed rotated and side bent right Sacrum right on right   Limited muscular skeletal ultrasound was performed and interpreted by Kristin [name], M   Limited ultrasound shows the patient does have some hypoechoic changes still in the wrist area on the right side that is consistent with a cyst formation noted.  Questionable OPD noted though on the distal  aspect of it going ventrally. Impression: Ruptured ganglion cyst    Assessment and Plan:  Ganglion cyst of volar aspect of right wrist Continues to be around.  Discussed with patient to continue to monitor.  We will not do any aspiration today but if it does seem to enlarge we will consider it at follow-up.  SI (sacroiliac) joint dysfunction Chronic problem with worsening symptoms.  Discussed icing regimen and home exercises, which  activities to do and which ones to avoid.  Follow-up again in 6 to 8 weeks continue medications including the gabapentin and meloxicam.    Nonallopathic problems  Decision today to treat with OMT was based on Physical Exam  After verbal consent patient was treated with HVLA, ME, FPR techniques in cervical, rib, thoracic, lumbar, and sacral  areas  Patient tolerated the procedure well with improvement in symptoms  Patient given exercises, stretches and lifestyle modifications  See medications in patient instructions if given  Patient will follow up in 4-8 weeks     The above documentation has been reviewed and is accurate and complete Kristin Saa, DO         Note: This dictation was prepared with Dragon dictation along with smaller phrase technology. Any transcriptional errors that result from this process are unintentional.

## 2022-06-17 ENCOUNTER — Encounter: Payer: Self-pay | Admitting: Family Medicine

## 2022-06-17 ENCOUNTER — Ambulatory Visit: Payer: Self-pay

## 2022-06-17 ENCOUNTER — Ambulatory Visit (INDEPENDENT_AMBULATORY_CARE_PROVIDER_SITE_OTHER): Payer: Self-pay | Admitting: Family Medicine

## 2022-06-17 VITALS — BP 142/102 | HR 71 | Ht 65.0 in | Wt 234.0 lb

## 2022-06-17 DIAGNOSIS — M9902 Segmental and somatic dysfunction of thoracic region: Secondary | ICD-10-CM

## 2022-06-17 DIAGNOSIS — M25531 Pain in right wrist: Secondary | ICD-10-CM

## 2022-06-17 DIAGNOSIS — M9903 Segmental and somatic dysfunction of lumbar region: Secondary | ICD-10-CM

## 2022-06-17 DIAGNOSIS — M9904 Segmental and somatic dysfunction of sacral region: Secondary | ICD-10-CM

## 2022-06-17 DIAGNOSIS — M9901 Segmental and somatic dysfunction of cervical region: Secondary | ICD-10-CM

## 2022-06-17 DIAGNOSIS — M533 Sacrococcygeal disorders, not elsewhere classified: Secondary | ICD-10-CM

## 2022-06-17 DIAGNOSIS — M67431 Ganglion, right wrist: Secondary | ICD-10-CM

## 2022-06-17 DIAGNOSIS — M9908 Segmental and somatic dysfunction of rib cage: Secondary | ICD-10-CM

## 2022-06-17 MED ORDER — PREDNISONE 20 MG PO TABS: 40.0000 mg | ORAL_TABLET | Freq: Every day | ORAL | 0 refills | Status: DC

## 2022-06-17 MED ORDER — GABAPENTIN 100 MG PO CAPS: 200.0000 mg | ORAL_CAPSULE | Freq: Every day | ORAL | 0 refills | Status: DC

## 2022-06-17 NOTE — Patient Instructions (Signed)
Gabapentin 200mg  at night Prednisone 40mg  for 5 days See me in 4-6 weeks

## 2022-06-17 NOTE — Assessment & Plan Note (Addendum)
Chronic problem with worsening symptoms.  Discussed icing regimen and home exercises, which activities to do and which ones to avoid.  Follow-up again in 6 to 8 weeks continue medications including the gabapentin and meloxicam. Prednisone and gabapentin given due to radicular symptoms

## 2022-06-17 NOTE — Assessment & Plan Note (Signed)
Continues to be around.  Discussed with patient to continue to monitor.  We will not do any aspiration today but if it does seem to enlarge we will consider it at follow-up.

## 2022-07-30 NOTE — Progress Notes (Signed)
[name] [address] [place] [address] Phone: [phone] Subjective:   Kristin [name], am serving as a scribe for Dr. Antoine Primas.  I'm seeing this patient by the request  of:  Patient, No Pcp Per  CC: Back pain, wrist pain, neck pain  [id number]  06/17/2022    Continues to be around.  Discussed with patient to continue to monitor.  We will not do any aspiration today but if it does seem to enlarge we will consider it at follow-up.     Update 08/01/2022 Kristin [name] is a 36 y.o. female coming in with complaint of back and neck pain. OMT 06/17/2022 and f/u for R wrist pain. Patient states that she was cutting something 2 weeks ago and cut into the L hand pointer finger, DIP. Is not able to feel sensations in this area.  Pain in lower back is worse in the mornings in L side of lower back. Pain, numbness and tingling radiate down L leg.   Cyst in wrist popped since last visit. No pain in wrist since then.   Medications patient has been prescribed: Gabapentin  Taking:       Reviewed prior external information including notes and imaging from previsou exam, outside providers and external EMR if available.   As well as notes that were available from care everywhere and other healthcare systems.  Past medical history, social, surgical and family history all reviewed in electronic medical record.  No pertanent information unless stated regarding to the chief complaint.   Past Medical History:  Diagnosis Date   Kidney stones    Knee joint pain    Pars defect of lumbar spine    L5   Pregnancy induced hypertension    Syncope     Allergies  Allergen Reactions   Amoxicillin Hives    Has patient had a PCN reaction causing immediate rash, facial/tongue/throat swelling, SOB or lightheadedness with hypotension: no Has patient had a PCN reaction causing severe rash involving mucus membranes or skin necrosis: No Has patient had a  PCN reaction that required hospitalization No Has patient had a PCN reaction occurring within the last 10 years: No If all of the above answers are "NO", then may proceed with Cephalosporin use.     Review of Systems:  No headache, visual changes, nausea, vomiting, diarrhea, constipation, dizziness, abdominal pain, skin rash, fevers, chills, night sweats, weight loss, swollen lymph nodes, body aches, joint swelling, chest pain, shortness of breath, mood changes. POSITIVE muscle aches  Objective  Blood pressure 130/84, height 5\' 5"  (1.651 m), weight 230 lb (104.3 kg), currently breastfeeding.   General: No apparent distress alert and oriented x3 mood and affect normal, dressed appropriately.  HEENT: Pupils equal, extraocular movements intact  Respiratory: Patient's speak in full sentences and does not appear short of breath  Cardiovascular: No lower extremity edema, non tender, no erythema  Gait MSK:  Back does have some mild increase in instability.  Worsening pain with extension of the back.  Negative straight leg test.  Right wrist exam still shows that patient does have a positive Tinel's sign.  Good grip strength noted.  Osteopathic findings  C2 flexed rotated and side bent right C6 flexed rotated and side bent left T3 extended rotated and side bent right inhaled rib T9 extended rotated and side bent left L2 flexed rotated and side bent right Sacrum right on right      Assessment and Plan:  Pars  defect of lumbar spine Concern for more instability with patient's pars defect.  Had responded extremely well last time.  Some mild increase in stress recently that has not allowed patient to be quite as active and doing the exercises.  Encourage patient to continue to work on weight loss which patient has done relatively well.  Discussed on core strengthening.  Discussed taking the gabapentin on a more regular basis.  Follow-up with me again in 6 to 8 weeks for further evaluation and  treatment    Nonallopathic problems  Decision today to treat with OMT was based on Physical Exam  After verbal consent patient was treated with HVLA, ME, FPR techniques in cervical, rib, thoracic, lumbar, and sacral  areas  Patient tolerated the procedure well with improvement in symptoms  Patient given exercises, stretches and lifestyle modifications  See medications in patient instructions if given  Patient will follow up in 4-8 weeks     The above documentation has been reviewed and is accurate and complete Lyndal Pulley, DO         Note: This dictation was prepared with Dragon dictation along with smaller phrase technology. Any transcriptional errors that result from this process are unintentional.

## 2022-08-01 ENCOUNTER — Ambulatory Visit: Payer: Self-pay

## 2022-08-01 ENCOUNTER — Ambulatory Visit (INDEPENDENT_AMBULATORY_CARE_PROVIDER_SITE_OTHER): Payer: Self-pay | Admitting: Family Medicine

## 2022-08-01 VITALS — BP 130/84 | Ht 65.0 in | Wt 230.0 lb

## 2022-08-01 DIAGNOSIS — M9903 Segmental and somatic dysfunction of lumbar region: Secondary | ICD-10-CM

## 2022-08-01 DIAGNOSIS — M4306 Spondylolysis, lumbar region: Secondary | ICD-10-CM

## 2022-08-01 DIAGNOSIS — M9901 Segmental and somatic dysfunction of cervical region: Secondary | ICD-10-CM

## 2022-08-01 DIAGNOSIS — M25531 Pain in right wrist: Secondary | ICD-10-CM

## 2022-08-01 DIAGNOSIS — M9908 Segmental and somatic dysfunction of rib cage: Secondary | ICD-10-CM

## 2022-08-01 DIAGNOSIS — M9902 Segmental and somatic dysfunction of thoracic region: Secondary | ICD-10-CM

## 2022-08-01 DIAGNOSIS — M67431 Ganglion, right wrist: Secondary | ICD-10-CM

## 2022-08-01 DIAGNOSIS — M9904 Segmental and somatic dysfunction of sacral region: Secondary | ICD-10-CM

## 2022-08-01 MED ORDER — GABAPENTIN 100 MG PO CAPS: 200.0000 mg | ORAL_CAPSULE | Freq: Every day | ORAL | 0 refills | Status: DC

## 2022-08-01 MED ORDER — PREDNISONE 20 MG PO TABS: 40.0000 mg | ORAL_TABLET | Freq: Every day | ORAL | 0 refills | Status: DC

## 2022-08-02 NOTE — Assessment & Plan Note (Signed)
No reaccumulation at this time

## 2022-08-02 NOTE — Assessment & Plan Note (Signed)
Concern for more instability with patient's pars defect.  Had responded extremely well last time.  Some mild increase in stress recently that has not allowed patient to be quite as active and doing the exercises.  Encourage patient to continue to work on weight loss which patient has done relatively well.  Discussed on core strengthening.  Discussed taking the gabapentin on a more regular basis.  Follow-up with me again in 6 to 8 weeks for further evaluation and treatment

## 2023-04-17 NOTE — Progress Notes (Signed)
[name] [address] [place] [address] Phone: [phone] Subjective:   Kristin [name], am serving as a scribe for Dr. Antoine [name].  I'm seeing this patient by the request  of:  Patient, No Pcp Per  CC: Back pain and wrist pain  [id number]  Kristin [name] is a 37 y.o. female coming in with complaint of back and neck pain. OMT on 08/01/2022. Patient states that her back is painful intermittently. Pain in lower back with pain on both sides.   Also would like cyst on R wrist drained.   Medications patient has been prescribed: gabapentin prednisone  Taking:         Reviewed prior external information including notes and imaging from previsou exam, outside providers and external EMR if available.   As well as notes that were available from care everywhere and other healthcare systems.  Past medical history, social, surgical and family history all reviewed in electronic medical record.  No pertanent information unless stated regarding to the chief complaint.   Past Medical History:  Diagnosis Date   Kidney stones    Knee joint pain    Pars defect of lumbar spine    L5   Pregnancy induced hypertension    Syncope     Allergies  Allergen Reactions   Amoxicillin Hives    Has patient had a PCN reaction causing immediate rash, facial/tongue/throat swelling, SOB or lightheadedness with hypotension: no Has patient had a PCN reaction causing severe rash involving mucus membranes or skin necrosis: No Has patient had a PCN reaction that required hospitalization No Has patient had a PCN reaction occurring within the last 10 years: No If all of the above answers are "NO", then may proceed with Cephalosporin use.     Review of Systems:  No headache, visual changes, nausea, vomiting, diarrhea, constipation, dizziness, abdominal pain, skin rash, fevers, chills, night sweats, weight loss, swollen lymph nodes, body aches, joint swelling,  chest pain, shortness of breath, mood changes. POSITIVE muscle aches  Objective  Blood pressure 128/84, height 5\' 5"  (1.651 m), weight 232 lb (105.2 kg), currently breastfeeding.   General: No apparent distress alert and oriented x3 mood and affect normal, dressed appropriately.  HEENT: Pupils equal, extraocular movements intact  Respiratory: Patient's speak in full sentences and does not appear short of breath  Cardiovascular: No lower extremity edema, non tender, no erythema  Low back does have some loss lordosis noted.  Some tenderness to palpation in the paraspinal musculature.  Right wrist on the ventral aspect near the radial artery does have a cyst formation noted.  Tender to palpation noted.  Osteopathic findings C6 flexed rotated and side bent left T3 extended rotated and side bent right inhaled rib L3 flexed rotated and side bent right Sacrum right on right  Procedure: Real-time Ultrasound Guided Injection of right ventral ganglion cyst of wrist Device: GE Logiq Q7 Ultrasound guided injection is preferred based studies that show increased duration, increased effect, greater accuracy, decreased procedural pain, increased response rate, and decreased cost with ultrasound guided versus blind injection.  Verbal informed consent obtained.  Time-out conducted.  Noted no overlying erythema, induration, or other signs of local infection.  Skin prepped in a sterile fashion.  Local anesthesia: Topical Ethyl chloride.  With sterile technique and under real time ultrasound guidance: With a 18-gauge 1-1/2 inch needle injected with 0.5 cc of 0.5% Marcaine and then aspirated some gel-like material.  Then injected with 0.5 cc  of Kenalog 40 mg/mL Completed without difficulty  Pain immediately resolved suggesting accurate placement of the medication.  Advised to call if fevers/chills, erythema, induration, drainage, or persistent bleeding.  Impression: Technically successful ultrasound guided  injection.    Assessment and Plan:  SI (sacroiliac) joint dysfunction Chronic, discussed HEP discussed core strengthening, using OTC meds.  No radicular symptoms RTC in 8-12 weeks   Ganglion cyst of volar aspect of right wrist Repeat aspiration done again today.  The patient Good results previously and hopefully will again.  Unfortunately does seem to reaccumulate.  Once again secondary to the positioning it is very difficult for any type of surgical intervention.  Will continue to monitor.  Follow-up with me again in 2 to 3 months    Nonallopathic problems  Decision today to treat with OMT was based on Physical Exam  After verbal consent patient was treated with HVLA, ME, FPR techniques in cervical, rib, thoracic, lumbar, and sacral  areas  Patient tolerated the procedure well with improvement in symptoms  Patient given exercises, stretches and lifestyle modifications  See medications in patient instructions if given  Patient will follow up in 6-8 weeks     The above documentation has been reviewed and is accurate and complete Kristin Saa, DO         Note: This dictation was prepared with Dragon dictation along with smaller phrase technology. Any transcriptional errors that result from this process are unintentional.

## 2023-04-22 ENCOUNTER — Encounter: Payer: Self-pay | Admitting: Family Medicine

## 2023-04-22 ENCOUNTER — Ambulatory Visit (INDEPENDENT_AMBULATORY_CARE_PROVIDER_SITE_OTHER): Payer: Self-pay | Admitting: Family Medicine

## 2023-04-22 ENCOUNTER — Other Ambulatory Visit: Payer: Self-pay

## 2023-04-22 VITALS — BP 128/84 | Ht 65.0 in | Wt 232.0 lb

## 2023-04-22 DIAGNOSIS — M9903 Segmental and somatic dysfunction of lumbar region: Secondary | ICD-10-CM

## 2023-04-22 DIAGNOSIS — M9908 Segmental and somatic dysfunction of rib cage: Secondary | ICD-10-CM

## 2023-04-22 DIAGNOSIS — M67431 Ganglion, right wrist: Secondary | ICD-10-CM

## 2023-04-22 DIAGNOSIS — M533 Sacrococcygeal disorders, not elsewhere classified: Secondary | ICD-10-CM

## 2023-04-22 DIAGNOSIS — M25531 Pain in right wrist: Secondary | ICD-10-CM

## 2023-04-22 DIAGNOSIS — M9901 Segmental and somatic dysfunction of cervical region: Secondary | ICD-10-CM

## 2023-04-22 DIAGNOSIS — M9904 Segmental and somatic dysfunction of sacral region: Secondary | ICD-10-CM

## 2023-04-22 DIAGNOSIS — M9902 Segmental and somatic dysfunction of thoracic region: Secondary | ICD-10-CM

## 2023-04-22 NOTE — Patient Instructions (Signed)
Great to see you  Thank you for keeping me on my toes 2 month follow up

## 2023-04-22 NOTE — Assessment & Plan Note (Signed)
Repeat aspiration done again today.  The patient Good results previously and hopefully will again.  Unfortunately does seem to reaccumulate.  Once again secondary to the positioning it is very difficult for any type of surgical intervention.  Will continue to monitor.  Follow-up with me again in 2 to 3 months

## 2023-04-22 NOTE — Assessment & Plan Note (Signed)
Chronic, discussed HEP discussed core strengthening, using OTC meds.  No radicular symptoms RTC in 8-12 weeks

## 2023-06-20 NOTE — Progress Notes (Deleted)
  [name] [address] [place] [address] Phone: [phone] Subjective:    I'm seeing this patient by the request  of:  Patient, No Pcp Per  CC: wrist and neck pain follow up   [id number]  Kristin [name] is a 37 y.o. female coming in with complaint of back and neck pain. OMT on 04/22/2023. Also seen for wrist pain. Patient states   Medications patient has been prescribed:   Taking:         Reviewed prior external information including notes and imaging from previsou exam, outside providers and external EMR if available.   As well as notes that were available from care everywhere and other healthcare systems.  Past medical history, social, surgical and family history all reviewed in electronic medical record.  No pertanent information unless stated regarding to the chief complaint.   Past Medical History:  Diagnosis Date   Kidney stones    Knee joint pain    Pars defect of lumbar spine    L5   Pregnancy induced hypertension    Syncope     Allergies  Allergen Reactions   Amoxicillin Hives    Has patient had a PCN reaction causing immediate rash, facial/tongue/throat swelling, SOB or lightheadedness with hypotension: no Has patient had a PCN reaction causing severe rash involving mucus membranes or skin necrosis: No Has patient had a PCN reaction that required hospitalization No Has patient had a PCN reaction occurring within the last 10 years: No If all of the above answers are "NO", then may proceed with Cephalosporin use.     Review of Systems:  No headache, visual changes, nausea, vomiting, diarrhea, constipation, dizziness, abdominal pain, skin rash, fevers, chills, night sweats, weight loss, swollen lymph nodes, body aches, joint swelling, chest pain, shortness of breath, mood changes. POSITIVE muscle aches  Objective  currently breastfeeding.   General: No apparent distress alert and oriented x3 mood and affect  normal, dressed appropriately.  HEENT: Pupils equal, extraocular movements intact  Respiratory: Patient's speak in full sentences and does not appear short of breath  Cardiovascular: No lower extremity edema, non tender, no erythema  MSK:  Back   Osteopathic findings  C2 flexed rotated and side bent right C5 flexed rotated and side bent left T3 extended rotated and side bent right inhaled rib T6 extended rotated and side bent left L1 flexed rotated and side bent right Sacrum right on right    Assessment and Plan:  No problem-specific Assessment & Plan notes found for this encounter.    Nonallopathic problems  Decision today to treat with OMT was based on Physical Exam  After verbal consent patient was treated with HVLA, ME, FPR techniques in cervical, rib, thoracic, lumbar, and sacral  areas  Patient tolerated the procedure well with improvement in symptoms  Patient given exercises, stretches and lifestyle modifications  See medications in patient instructions if given  Patient will follow up in 4-8 weeks     The above documentation has been reviewed and is accurate and complete [name], DO         Note: This dictation was prepared with Dragon dictation along with smaller phrase technology. Any transcriptional errors that result from this process are unintentional.

## 2023-06-25 ENCOUNTER — Ambulatory Visit: Payer: Medicaid Other | Admitting: Family Medicine

## 2023-10-10 ENCOUNTER — Ambulatory Visit (INDEPENDENT_AMBULATORY_CARE_PROVIDER_SITE_OTHER): Payer: Managed Care, Other (non HMO)

## 2023-10-10 ENCOUNTER — Other Ambulatory Visit: Payer: Self-pay

## 2023-10-10 ENCOUNTER — Ambulatory Visit
Admission: EM | Admit: 2023-10-10 | Discharge: 2023-10-10 | Disposition: A | Discharge status: Home / Self Care | Payer: Managed Care, Other (non HMO)

## 2023-10-10 DIAGNOSIS — R8271 Bacteriuria: Secondary | ICD-10-CM | POA: Insufficient documentation

## 2023-10-10 DIAGNOSIS — R091 Pleurisy: Secondary | ICD-10-CM

## 2023-10-10 DIAGNOSIS — R0602 Shortness of breath: Secondary | ICD-10-CM | POA: Diagnosis not present

## 2023-10-10 DIAGNOSIS — Z87442 Personal history of urinary calculi: Secondary | ICD-10-CM | POA: Insufficient documentation

## 2023-10-10 DIAGNOSIS — J040 Acute laryngitis: Secondary | ICD-10-CM

## 2023-10-10 DIAGNOSIS — O409XX0 Polyhydramnios, unspecified trimester, not applicable or unspecified: Secondary | ICD-10-CM | POA: Insufficient documentation

## 2023-10-10 HISTORY — DX: Polyhydramnios, unspecified trimester, not applicable or unspecified: O40.9XX0

## 2023-10-10 MED ORDER — ALBUTEROL SULFATE HFA 108 (90 BASE) MCG/ACT IN AERS
2.0000 | INHALATION_SPRAY | Freq: Once | RESPIRATORY_TRACT | Status: AC
  Administered 2023-10-10: 2 via RESPIRATORY_TRACT

## 2023-10-10 MED ORDER — PREDNISONE 20 MG PO TABS: 20.0000 mg | ORAL_TABLET | Freq: Every day | ORAL | 0 refills | Status: AC

## 2023-10-10 NOTE — Discharge Instructions (Addendum)
Your EKG and chest xray are normal. But since you are still short of breath, you need more test done in the ER. Please go there right now. With Pleurisy you only have pain and not shortness of breath, so it is best to make sure there  is not a blood clot in your lungs or any abnormal blood work.   I will call you when the final xray report is done.

## 2023-10-10 NOTE — ED Provider Notes (Signed)
[facility]    CSN: [id number] Arrival date & time: 10/10/23  1121      History   Chief Complaint Chief Complaint  Patient presents with   Shortness of Breath    HPI Kristin [name] is a 38 y.o. female who presents due to waking up SOB this am and had lost her voice as well. Her husband told her that her lips were purple when she work up. Her husband denies her snoring. She denies wheezing or coughing. Has substernal chest pain with deep breaths. Denies any travel or laying still for prolonged times in the past month. Denies calf pain and swelling. She tried to work today, but could not due to SOB. Denies being on any hormones or birth control pills.     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   Ganglion cyst of wrist, right 07/05/2020   Acute pain of right wrist 07/04/2020   White matter abnormality on MRI of brain 06/28/2020   Dysesthesia 06/28/2020   Left-sided weakness 06/28/2020   Vision disturbance 06/28/2020   Depression with anxiety 06/28/2020   Insomnia 06/28/2020   AMS (altered mental status) 06/13/2020   Stroke-like symptoms 06/12/2020   Ganglion cyst of volar aspect of right wrist 03/07/2020   Superficial phlebitis 12/02/2019   De Quervain's tenosynovitis, right 10/18/2019   Pes anserinus bursitis of both knees 08/17/2019   Epidermoid cyst of finger of right hand 03/09/2019   Subluxation of extensor carpi ulnaris tendon, left, initial encounter 10/21/2018   Plica syndrome of knee, right 07/03/2018   SI (sacroiliac) joint dysfunction 03/18/2018    Piriformis syndrome of left side 12/30/2017   Nonallopathic lesion of sacral region 07/22/2017   Nonallopathic lesion of lumbosacral region 07/22/2017   Nonallopathic lesion of thoracic region 07/22/2017   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   Vaginal delivery 04/15/2016   BMI 40.0-44.9, adult (HCC) 04/14/2016   History of pregnancy induced hypertension--prior pregnancy 03/24/2016   Positive GBS test 03/24/2016   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   LGA (large for gestational age) fetus--hx with 1st pregnancy, 9+6 03/01/2016   Allergy to amoxicillin 03/01/2016   Atypical squamous cells of undetermined significance on cytologic smear of cervix (ASC-US) 11/09/2015   Abnormal Papanicolaou smear of vagina 10/26/2015   Large for gestational age fetus affecting management of mother 10/26/2015   Syncope 08/21/2015   Hypokalemia 08/21/2015   Anxiety 08/21/2015   Nephrolithiasis 03/02/2014   Nausea in adult 03/02/2014   Migraine headache 03/02/2014   Back pain 03/02/2014   Ureteral calculus 02/24/2014    Past Surgical History:  Procedure Laterality Date   EXTRACORPOREAL SHOCK WAVE LITHOTRIPSY Left 01/30/2017   Procedure: LEFT EXTRACORPOREAL SHOCK WAVE LITHOTRIPSY (ESWL);  Surgeon: Malen Gauze, MD;  Location: [facility];  Service: Urology;  Laterality: Left;   KIDNEY STONE SURGERY      OB History     Gravida  4   Para  2   Term  2   Preterm  0   AB  2   Living  2      SAB  2   IAB      Ectopic      Multiple  0   Live Births  1            Home Medications    Prior to Admission medications   Medication Sig Start Date End Date Taking? Authorizing Provider  predniSONE (DELTASONE) 20 MG tablet Take 1 tablet (20 mg total) by mouth daily with breakfast for 5 days. 10/10/23 10/15/23 Yes Rodriguez-Southworth, Nettie Elm, PA-C  acetaminophen (TYLENOL) 325 MG tablet Take 325 mg by mouth daily as needed for mild pain or moderate pain.    [provider]  diclofenac Sodium (PENNSAID) 2 % SOLN Apply 2 Pump topically 2 (two) times daily as needed.    [provider]  gabapentin (NEURONTIN) 100 MG capsule Take 2 capsules (200 mg total) by mouth at bedtime. 08/01/22   [name], DO  hydrOXYzine (VISTARIL) 25 MG capsule Take 25 mg by mouth as directed.    [provider]  ibuprofen (ADVIL) 800 MG tablet TAKE 1 TABLET BY MOUTH EVERY 8 HOURS AS NEEDED 03/01/21   [name], DO  meloxicam (MOBIC) 7.5 MG tablet TAKE 1 TABLET BY MOUTH EVERY DAY 01/18/21   [name], DO  methocarbamol (ROBAXIN) 500 MG tablet Take 1 tablet by mouth every 8 (eight) hours as needed.    [provider]  naproxen (NAPROSYN) 500 MG tablet Take 1 tablet by mouth 2 (two) times daily.    [provider]  naproxen sodium (ANAPROX) 550 MG tablet Take 550 mg by mouth 2 (two) times daily with a meal.    [provider]  traMADol (ULTRAM) 50 MG tablet Take 50 mg by mouth every 12 (twelve) hours as needed.    [provider]  traMADol-acetaminophen (ULTRACET) 37.5-325 MG tablet Take 1 tablet by mouth every 4 (four) hours as needed.    [provider]  traZODone (DESYREL) 100 MG tablet TAKE 1 TABLET BY MOUTH EVERYDAY AT BEDTIME 07/20/20   Sater, Pearletha Furl, MD  Vitamin D, Ergocalciferol, (DRISDOL) 1.25 MG (50000 UNIT) CAPS capsule Take 1 capsule (50,000 Units total) by mouth every 7 (seven) days. 06/28/20   Sater, Pearletha Furl, MD    Family History Family History  Problem Relation Age of Onset   CVA Mother    Hypertension Mother    Diabetes Mellitus II Mother    Congestive Heart Failure Mother    Stroke Mother    CVA Father    Hypertension Father    Diabetes Mellitus II Father    Heart disease Father    Stroke Father    Stroke Sister     Social History Social History   Tobacco Use   Smoking status: Never   Smokeless tobacco: Never  Vaping Use   Vaping status: Never Used  Substance Use Topics    Alcohol use: Yes    Comment: rarely   Drug use: No     Allergies   Amoxicillin   Review of Systems Review of Systems  As noted in HPI Physical Exam Triage Vital Signs ED Triage Vitals  Encounter Vitals Group     BP 10/10/23 1131 (!) 161/88     Systolic BP Percentile --      Diastolic BP Percentile --      Pulse Rate 10/10/23 1131 68     Resp 10/10/23  1131 20     Temp 10/10/23 1131 97.7 F (36.5 C)     Temp Source 10/10/23 1131 Oral     SpO2 10/10/23 1131 100 %     Weight 10/10/23 1132 231 lb 14.8 oz (105.2 kg)     Height 10/10/23 1132 5\' 5"  (1.651 m)     Head Circumference --      Peak Flow --      Pain Score 10/10/23 1131 0     Pain Loc --      Pain Education --      Exclude from Growth Chart --    No data found.  Updated Vital Signs BP 135/88 (BP Location: Left Arm)   Pulse 72   Temp 97.7 F (36.5 C) (Oral)   Resp (!) 22   Ht 5\' 5"  (1.651 m)   Wt 231 lb 14.8 oz (105.2 kg)   LMP 09/27/2023 (Exact Date)   SpO2 100%   BMI 38.59 kg/m   Visual Acuity Right Eye Distance:   Left Eye Distance:   Bilateral Distance:    Right Eye Near:   Left Eye Near:    Bilateral Near:     Physical Exam Vitals reviewed.  Constitutional:      General: She is not in acute distress.    Appearance: She is obese. She is not toxic-appearing.  Eyes:     Conjunctiva/sclera: Conjunctivae normal.  Cardiovascular:     Rate and Rhythm: Normal rate and regular rhythm.     Heart sounds: No murmur heard. Pulmonary:     Effort: Pulmonary effort is normal.     Breath sounds: Normal breath sounds.  Chest:     Chest wall: No tenderness.  Musculoskeletal:        General: Normal range of motion.     Cervical back: Neck supple.     Right lower leg: No edema.     Left lower leg: No edema.     Comments: Negative Hoffmnan's  Skin:    General: Skin is warm and dry.     Findings: No rash.  Neurological:     Mental Status: She is alert and oriented to person, place, and time.      Gait: Gait normal.  Psychiatric:        Mood and Affect: Mood normal.        Behavior: Behavior normal.        Thought Content: Thought content normal.        Judgment: Judgment normal.      UC Treatments / Results  Labs (all labs ordered are listed, but only abnormal results are displayed) Labs Reviewed - No data to display  EKG NSR, normal EKG  Radiology DG Chest 2 View Result Date: 10/10/2023 CLINICAL DATA:  Sudden shortness of breath EXAM: CHEST - 2 VIEW COMPARISON:  12/15/2016 FINDINGS: The heart size and mediastinal contours are within normal limits. Both lungs are clear. The visualized skeletal structures are unremarkable. IMPRESSION: No active cardiopulmonary disease. Electronically Signed   By: Jasmine Pang M.D.   On: 10/10/2023 19:02    Procedures Procedures (including critical care time)  Medications Ordered in UC Medications  albuterol (VENTOLIN HFA) 108 (90 Base) MCG/ACT inhaler 2 puff (2 puffs Inhalation Given 10/10/23 1157)    Initial Impression / Assessment and Plan / UC Course  I have reviewed the triage vital signs and the nursing notes. She tried albuterol inhaler 2 puffs and did not help her SOB Pertinent labs &  imaging results that were available during my care of the patient were reviewed by me and considered in my medical decision making (see chart for details).  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 But since you are still short of breath, you need more test done in the ER. Please go there right now. With Pleurisy you only have pain and not shortness of breath, so it is best to make sure there  is not a blood clot in your lungs or any abnormal blood work.   I will call  you when the final xray report is done.      ED Prescriptions     Medication Sig Dispense Auth. Provider   predniSONE (DELTASONE) 20 MG tablet Take 1 tablet (20 mg total) by mouth daily with breakfast for 5 days. 5 tablet Rodriguez-Southworth, Nettie Elm, PA-C      PDMP not reviewed this encounter.   Garey Ham, Cordelia Poche 10/10/23 2103

## 2023-10-10 NOTE — ED Triage Notes (Signed)
"  It hurts to take deep breaths". "First noticed  last 2 days but got really bad overnight nad this morning". No fever. No runny nose. No cough. No history of asthma. No history of COPD.

## 2023-10-10 NOTE — ED Notes (Signed)
I called  imaging four times for a 2 view chest xray read and it was read until 7:08pm. The last person I talked to stated that the image was on routine/outpatient.

## 2023-10-10 NOTE — ED Notes (Signed)
   See Flow sheets for original.

## 2023-10-13 ENCOUNTER — Encounter: Payer: Self-pay | Admitting: Family

## 2023-10-13 ENCOUNTER — Ambulatory Visit: Payer: Managed Care, Other (non HMO) | Admitting: Family

## 2023-10-13 VITALS — BP 152/87 | HR 73 | Temp 97.8°F | Ht 65.0 in | Wt 235.0 lb

## 2023-10-13 DIAGNOSIS — Z1159 Encounter for screening for other viral diseases: Secondary | ICD-10-CM

## 2023-10-13 DIAGNOSIS — R9082 White matter disease, unspecified: Secondary | ICD-10-CM

## 2023-10-13 DIAGNOSIS — R413 Other amnesia: Secondary | ICD-10-CM | POA: Diagnosis not present

## 2023-10-13 DIAGNOSIS — E559 Vitamin D deficiency, unspecified: Secondary | ICD-10-CM | POA: Diagnosis not present

## 2023-10-13 DIAGNOSIS — I10 Essential (primary) hypertension: Secondary | ICD-10-CM | POA: Insufficient documentation

## 2023-10-13 DIAGNOSIS — Z0000 Encounter for general adult medical examination without abnormal findings: Secondary | ICD-10-CM

## 2023-10-13 DIAGNOSIS — D500 Iron deficiency anemia secondary to blood loss (chronic): Secondary | ICD-10-CM

## 2023-10-13 NOTE — Progress Notes (Signed)
Phone [phone]  Subjective:   Patient is a 38 y.o. female presenting for annual physical.    Chief Complaint  Patient presents with   New Patient (Initial Visit)   Annual Exam    Non fasting w/ labs     History of Present Illness   The patient is here to establish care and she has multiple health concerns. The patient reports elevated blood pressure, which has been a recurring issue. She acknowledges a recent weight gain of about 30 pounds over the last couple of years, which she attributes to dietary changes and lack of exercise. She expresses a desire to lose weight again, as she had successfully done in the past through diet and exercise. The patient also reports experiencing tremors, particularly in the right arm and hand. She describes these tremors as intermittent and not associated with any balance issues. She has been evaluated by a neurologist in the past, but no definitive diagnosis or treatment plan was established. Additionally, the patient has been experiencing breathing difficulties. She has been prescribed prednisone and albuterol to manage these symptoms. She reports some improvement in her breathing but expresses concern about persistent cough and throat discomfort.     See problem oriented charting- ROS- full  review of systems was completed and negative other than what is in HPI above.  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   Ganglion cyst of wrist, right 07/05/2020   Acute pain of right wrist 07/04/2020   White matter abnormality on MRI of brain 06/28/2020   Dysesthesia 06/28/2020   Left-sided weakness 06/28/2020   Vision disturbance 06/28/2020   Depression with anxiety 06/28/2020   Insomnia 06/28/2020   AMS (altered mental status) 06/13/2020   Stroke-like symptoms 06/12/2020   Ganglion cyst of volar aspect of right wrist 03/07/2020   De Quervain's tenosynovitis, right 10/18/2019   Pes anserinus bursitis of both knees 08/17/2019   Epidermoid cyst of finger of right hand 03/09/2019   Subluxation of extensor carpi ulnaris tendon, left, initial encounter 10/21/2018   Plica syndrome of knee, right 07/03/2018   SI (sacroiliac) joint dysfunction 03/18/2018   Piriformis syndrome of left side 12/30/2017   Nonallopathic lesion of sacral region 07/22/2017   Nonallopathic lesion of lumbosacral region 07/22/2017   Nonallopathic lesion of thoracic region 07/22/2017   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   Vaginal delivery 04/15/2016   BMI 40.0-44.9, adult (HCC) 04/14/2016   History of pregnancy induced hypertension--prior pregnancy 03/24/2016   Positive GBS test 03/24/2016   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   LGA (large for gestational age) fetus--hx with 1st pregnancy, 9+6 03/01/2016   Allergy to amoxicillin 03/01/2016   Atypical squamous cells of undetermined significance on cytologic smear of cervix (ASC-US) 11/09/2015   Abnormal Papanicolaou smear of vagina 10/26/2015   Large for gestational age fetus affecting management of mother 10/26/2015  Syncope 08/21/2015   Hypokalemia 08/21/2015   Anxiety 08/21/2015   Nephrolithiasis 03/02/2014   Nausea in adult 03/02/2014   Back pain 03/02/2014   Ureteral calculus 02/24/2014   Past Surgical History:  Procedure Laterality Date   EXTRACORPOREAL SHOCK WAVE LITHOTRIPSY Left 01/30/2017   Procedure: LEFT  EXTRACORPOREAL SHOCK WAVE LITHOTRIPSY (ESWL);  Surgeon: Malen Gauze, MD;  Location: [facility];  Service: Urology;  Laterality: Left;   KIDNEY STONE SURGERY      Family History  Problem Relation Age of Onset   Hyperlipidemia Mother    Diabetes Mother    Depression Mother    CVA Mother    Hypertension Mother    Diabetes Mellitus II Mother    Congestive Heart Failure Mother    Stroke Mother    Heart attack Mother    Hyperlipidemia Father    Heart attack Father    CVA Father    Hypertension Father    Diabetes Mellitus II Father    Heart disease Father    Stroke Father    Stroke Sister    Stroke Maternal Grandmother    Hypertension Maternal Grandmother    Hyperlipidemia Maternal Grandmother    Heart disease Maternal Grandmother    Diabetes Maternal Grandmother    Cancer Maternal Grandfather    Diabetes Maternal Grandfather     Medications- reviewed and updated Current Outpatient Medications  Medication Sig Dispense Refill   albuterol (VENTOLIN HFA) 108 (90 Base) MCG/ACT inhaler Inhale into the lungs every 6 (six) hours as needed for wheezing or shortness of breath.     predniSONE (DELTASONE) 20 MG tablet Take 1 tablet (20 mg total) by mouth daily with breakfast for 5 days. 5 tablet 0   Vitamin D, Ergocalciferol, (DRISDOL) 1.25 MG (50000 UNIT) CAPS capsule Take 1 capsule (50,000 Units total) by mouth every 7 (seven) days. 13 capsule 2   No current facility-administered medications for this visit.   Allergies-reviewed and updated Allergies  Allergen Reactions   Amoxicillin Hives    Has patient had a PCN reaction causing immediate rash, facial/tongue/throat swelling, SOB or lightheadedness with hypotension: no  Has patient had a PCN reaction causing severe rash involving mucus membranes or skin necrosis: No  Has patient had a PCN reaction that required hospitalization No  Has patient had a PCN reaction occurring within the last 10 years: No  If all of the above  answers are "NO", then may proceed with Cephalosporin use.  Other Reaction(s): Not available    Social History   Social History Narrative   Right handed   No caffeine use   Lives with husband and two kids    Objective:  BP (!) 152/87 (BP Location: Left Arm, Patient Position: Sitting, Cuff Size: Large)   Pulse 73   Temp 97.8 F (36.6 C) (Temporal)   Ht 5\' 5"  (1.651 m)   Wt 235 lb (106.6 kg)   LMP 09/27/2023 (Exact Date)   SpO2 98%   Breastfeeding No   BMI 39.11 kg/m  Physical Exam Vitals and nursing note reviewed.  Constitutional:      Appearance: Normal appearance. She is obese.  HENT:     Head: Normocephalic.     Right Ear: Tympanic membrane normal.     Left Ear: Tympanic membrane normal.     Nose: Nose normal.     Mouth/Throat:     Mouth: Mucous membranes are moist.  Eyes:     Pupils: Pupils are equal, round, and reactive to light.  Cardiovascular:     Rate and Rhythm: Normal rate and regular rhythm.  Pulmonary:     Effort: Pulmonary effort is normal.     Breath sounds: Normal breath sounds.  Musculoskeletal:        General: Normal range of motion.     Cervical back: Normal range of motion.  Lymphadenopathy:     Cervical: No cervical adenopathy.  Skin:    General: Skin is warm and dry.  Neurological:     Mental Status: She is alert.  Psychiatric:        Mood and Affect: Mood normal.        Behavior: Behavior normal.     Assessment and Plan   Health Maintenance counseling: 1. Anticipatory guidance: Patient counseled regarding regular dental exams q6 months, eye exams,  avoiding smoking and second hand smoke, limiting alcohol to 1 beverage per day, no illicit drugs.   2. Risk factor reduction:  Advised patient of need for regular exercise and diet rich with fruits and vegetables to reduce risk of heart attack and stroke. Wt Readings from Last 3 Encounters:  10/13/23 235 lb (106.6 kg)  10/10/23 231 lb 14.8 oz (105.2 kg)  04/22/23 232 lb (105.2 kg)    3. Immunizations/screenings/ancillary studies Immunization History  Administered Date(s) Administered   Tdap 04/16/2016   There are no preventive care reminders to display for this patient.   4. Cervical cancer screening: due 2026 5. Skin cancer screening- advised regular sunscreen use. Denies worrisome, changing, or new skin lesions.  6. Birth control/STD check: none/N/A 7. Smoking associated screening: non- smoker 8. Alcohol screening: none  Assessment and Plan    Hypertension - Chronic, untreated; Elevated blood pressure noted during visit. Hx of elevated readings in chart, never treated. Discussed the importance of aggressive management of hypertension and the potential damage to kidneys & other organs. Patient reports some recent weight gain which may be contributing to elevated blood pressure. -Encouraged patient to lose weight through diet and exercise, increase water intake, and reduce salt intake. -F/U in 1 mo  Shortness of breath -  Patient reported difficulty breathing and is currently on a short course of Prednisone. No history of asthma. Reports less SOB with rescue inhaler.  -Advised patient to use Albuterol inhaler 1-2 times daily and before bedtime to improve air exchange. -If symptoms persist, consider the need for a steroid inhaler. -F/U prn  Memory loss & Neurological Symptoms - Patient reports history of multiple sclerosis and recent symptoms including tremors & tingling in right arm and hand, and having periods of short term memory loss. -Referral to Neurology for further evaluation and management.  Annual Physical -  -Continue monitoring blood pressure and weight. -Check labs and review results by the end of the week on MyChart. -Advised on healthy diet, exercise, hydrating with 2L water qd     Recommended follow up:  Return for any future concerns, Complete physical w/fasting labs. Future Appointments  Date Time Provider Department Center  10/29/2023   4:00 PM [name], DO [facility] None  10/13/2024  3:00 PM Dulce Sellar, NP [facility]    Lab/Order associations: non-fasting    Dulce Sellar, NP

## 2023-10-13 NOTE — Assessment & Plan Note (Signed)
Patient reports history of multiple sclerosis seen 2 yrs ago by [facility]. Recent symptoms including tremors in the right arm and hand, and having periods of short term memory loss. -Referral to new Neurology office for further evaluation and management.

## 2023-10-13 NOTE — Assessment & Plan Note (Signed)
Chronic, untreated. Elevated blood pressure noted during visit. Hx of elevated readings in chart, never treated. Discussed the importance of aggressive management of hypertension and the potential damage to kidneys & other organs. Patient reports some recent weight gain which may be contributing to elevated blood pressure. -Encouraged patient to lose weight through diet and exercise, increase water intake, and reduce salt intake. -F/U in 1 month to recheck BP

## 2023-10-13 NOTE — Patient Instructions (Addendum)
Welcome to [facility], It was a pleasure meeting you today!   I will review your lab results via MyChart in a few days.  I am sending a referral to our Neurology office.    PLEASE NOTE: If you had any LAB tests please let us know if you have not heard back within a few days. You may see your results on MyChart before we have a chance to review them but we will give you a call once they are reviewed by [place]. If we ordered any REFERRALS today, please let us know if you have not heard from their office within the next week.  Let us know through MyChart if you are needing REFILLS, or have your pharmacy send [place] the request. You can also use MyChart to communicate with me or any office staff.  Please try these tips to maintain a healthy lifestyle: It is important that you exercise regularly at least 30 minutes 5 times a week. Think about what you will eat, plan ahead. Choose whole foods, & think  "clean, green, fresh or frozen" over canned, processed or packaged foods which are more sugary, salty, and fatty. 70 to 75% of food eaten should be fresh vegetables and protein. 2-3  meals daily with healthy snacks between meals, but must be whole fruit, protein or vegetables. Aim to eat over a 10 hour period when you are active, for example, 7am to 5pm, and then STOP after your last meal of the day, drinking only water.  Shorter eating windows, 6-8 hours, are showing benefits in heart disease and blood sugar regulation. Drink water every day! Shoot for 64 ounces daily = 8 cups, no other drink is as healthy! Fruit juice is best enjoyed in a healthy way, by EATING the fruit.

## 2023-10-14 ENCOUNTER — Encounter: Payer: Self-pay | Admitting: Family

## 2023-10-14 DIAGNOSIS — R110 Nausea: Secondary | ICD-10-CM

## 2023-10-14 LAB — CBC WITH DIFFERENTIAL/PLATELET
Basophils Absolute: 0.1 10*3/uL (ref 0.0–0.1)
Basophils Relative: 0.4 % (ref 0.0–3.0)
Eosinophils Absolute: 0 10*3/uL (ref 0.0–0.7)
Eosinophils Relative: 0.1 % (ref 0.0–5.0)
HCT: 28.4 % — ABNORMAL LOW (ref 36.0–46.0)
Hemoglobin: 8.6 g/dL — ABNORMAL LOW (ref 12.0–15.0)
Lymphocytes Relative: 7.3 % — ABNORMAL LOW (ref 12.0–46.0)
Lymphs Abs: 1 10*3/uL (ref 0.7–4.0)
MCHC: 30.4 g/dL (ref 30.0–36.0)
MCV: 69.8 fL — ABNORMAL LOW (ref 78.0–100.0)
Monocytes Absolute: 0.5 10*3/uL (ref 0.1–1.0)
Monocytes Relative: 3.6 % (ref 3.0–12.0)
Neutro Abs: 12.7 10*3/uL — ABNORMAL HIGH (ref 1.4–7.7)
Neutrophils Relative %: 88.6 % — ABNORMAL HIGH (ref 43.0–77.0)
Platelets: 381 10*3/uL (ref 150.0–400.0)
RBC: 4.06 Mil/uL (ref 3.87–5.11)
RDW: 16.8 % — ABNORMAL HIGH (ref 11.5–15.5)
WBC: 14.3 10*3/uL — ABNORMAL HIGH (ref 4.0–10.5)

## 2023-10-14 LAB — COMPREHENSIVE METABOLIC PANEL
ALT: 18 U/L (ref 0–35)
AST: 17 U/L (ref 0–37)
Albumin: 4.1 g/dL (ref 3.5–5.2)
Alkaline Phosphatase: 72 U/L (ref 39–117)
BUN: 12 mg/dL (ref 6–23)
CO2: 26 meq/L (ref 19–32)
Calcium: 9.7 mg/dL (ref 8.4–10.5)
Chloride: 105 meq/L (ref 96–112)
Creatinine, Ser: 0.58 mg/dL (ref 0.40–1.20)
GFR: 114.5 mL/min (ref >60.00)
Glucose, Bld: 85 mg/dL (ref 70–99)
Potassium: 4.2 meq/L (ref 3.5–5.1)
Sodium: 137 meq/L (ref 135–145)
Total Bilirubin: 0.3 mg/dL (ref 0.2–1.2)
Total Protein: 7.7 g/dL (ref 6.0–8.3)

## 2023-10-14 LAB — TSH: TSH: 0.94 u[IU]/mL (ref 0.35–5.50)

## 2023-10-14 LAB — LIPID PANEL
Cholesterol: 159 mg/dL (ref 0–200)
HDL: 49.5 mg/dL (ref >39.00)
LDL Cholesterol: 84 mg/dL (ref 0–99)
NonHDL: 109.06
Total CHOL/HDL Ratio: 3
Triglycerides: 126 mg/dL (ref 0.0–149.0)
VLDL: 25.2 mg/dL (ref 0.0–40.0)

## 2023-10-14 LAB — HEPATITIS C ANTIBODY: Hepatitis C Ab: NONREACTIVE

## 2023-10-14 LAB — VITAMIN D 25 HYDROXY (VIT D DEFICIENCY, FRACTURES): VITD: 16.75 ng/mL — ABNORMAL LOW (ref 30.00–100.00)

## 2023-10-14 MED ORDER — FERROUS SULFATE 324 MG PO TBEC: 324.0000 mg | DELAYED_RELEASE_TABLET | Freq: Every day | ORAL | 5 refills | Status: DC

## 2023-10-14 MED ORDER — VITAMIN D3 1.25 MG (50000 UT) PO CAPS: 1.0000 | ORAL_CAPSULE | ORAL | 3 refills | Status: AC

## 2023-10-14 NOTE — Addendum Note (Signed)
Addended byDulce Sellar on: 10/14/2023 05:13 PM   Modules accepted: Orders

## 2023-10-17 MED ORDER — FERROUS SULFATE 324 MG PO TBEC: 324.0000 mg | DELAYED_RELEASE_TABLET | Freq: Every day | ORAL | 5 refills | Status: AC

## 2023-10-17 NOTE — Addendum Note (Signed)
Addended by: Jobe Gibbon on: 10/17/2023 10:03 AM   Modules accepted: Orders

## 2023-10-22 MED ORDER — ONDANSETRON HCL 4 MG PO TABS: 4.0000 mg | ORAL_TABLET | Freq: Three times a day (TID) | ORAL | 1 refills | Status: AC | PRN

## 2023-10-22 NOTE — Telephone Encounter (Signed)
yes, sending zofran, recommend taking 30 minutes prior to the iron pill & also ok to eat before taking the iron pill.

## 2023-10-29 ENCOUNTER — Ambulatory Visit: Payer: No Typology Code available for payment source | Admitting: Family Medicine

## 2023-11-14 NOTE — Progress Notes (Deleted)
 [name] [address] [place] [address] Phone: [phone] Subjective:    I'm seeing this patient by the request  of:  Dulce Sellar, NP  CC:   [id number]  Kristin [name] is a 38 y.o. female coming in with complaint of back and neck pain. OMT on 04/22/2023. Patient states   Medications patient has been prescribed:   Taking:         Reviewed prior external information including notes and imaging from previsou exam, outside providers and external EMR if available.   As well as notes that were available from care everywhere and other healthcare systems.  Past medical history, social, surgical and family history all reviewed in electronic medical record.  No pertanent information unless stated regarding to the chief complaint.   Past Medical History:  Diagnosis Date   Acute pain of right wrist 07/04/2020   Allergy to amoxicillin 03/01/2016   GBS with sensititivies     AMS (altered mental status) 06/13/2020   Anxiety 08/21/2015   De Quervain's tenosynovitis, right 10/18/2019   Epidermoid cyst of finger of right hand 03/09/2019   Ganglion cyst of volar aspect of right wrist 03/07/2020   Injection March 07, 2020 aspiration done again April 14, 2020 repeat aspiration September 21, 2020 attempted aspiration again December 22, 2020 repeat aspiration Feb 16, 2021 repeat aspiration April 22, 2023     Ganglion cyst of wrist, right 07/05/2020   History of pregnancy induced hypertension--prior pregnancy 03/24/2016   Hypokalemia 08/21/2015   Insomnia 06/28/2020   Kidney stones    Knee joint pain    Left-sided weakness 06/28/2020   Migraine headache 03/02/2014   MS (multiple sclerosis) (HCC) 2021   Nausea in adult 03/02/2014   Nephrolithiasis 03/02/2014   Pars defect of lumbar spine    L5   Piriformis syndrome of left side 12/30/2017   Injected December 30, 2017     Polyhydramnios 10/10/2023   Dx 03/11/16--AFI 27.66, 97%ile--recheck AFI in  2 weeks, weekly BPPs     Positive GBS test 03/24/2016   Pregnancy induced hypertension    Stroke-like symptoms 06/12/2020   Subluxation of extensor carpi ulnaris tendon, left, initial encounter 10/21/2018   Superficial phlebitis 12/02/2019   Syncope    Ureteral calculus 02/24/2014   Vaginal delivery 04/15/2016   Vision disturbance 06/28/2020    Allergies  Allergen Reactions   Amoxicillin Hives    Has patient had a PCN reaction causing immediate rash, facial/tongue/throat swelling, SOB or lightheadedness with hypotension: no  Has patient had a PCN reaction causing severe rash involving mucus membranes or skin necrosis: No  Has patient had a PCN reaction that required hospitalization No  Has patient had a PCN reaction occurring within the last 10 years: No  If all of the above answers are "NO", then may proceed with Cephalosporin use.  Other Reaction(s): Not available     Review of Systems:  No headache, visual changes, nausea, vomiting, diarrhea, constipation, dizziness, abdominal pain, skin rash, fevers, chills, night sweats, weight loss, swollen lymph nodes, body aches, joint swelling, chest pain, shortness of breath, mood changes. POSITIVE muscle aches  Objective  There were no vitals taken for this visit.   General: No apparent distress alert and oriented x3 mood and affect normal, dressed appropriately.  HEENT: Pupils equal, extraocular movements intact  Respiratory: Patient's speak in full sentences and does not appear short of breath  Cardiovascular: No lower extremity edema, non tender, no erythema  Gait  MSK:  Back   Osteopathic findings  C2 flexed rotated and side bent right C6 flexed rotated and side bent left T3 extended rotated and side bent right inhaled rib T9 extended rotated and side bent left L2 flexed rotated and side bent right Sacrum right on right       Assessment and Plan:  No problem-specific Assessment & Plan notes found for this  encounter.    Nonallopathic problems  Decision today to treat with OMT was based on Physical Exam  After verbal consent patient was treated with HVLA, ME, FPR techniques in cervical, rib, thoracic, lumbar, and sacral  areas  Patient tolerated the procedure well with improvement in symptoms  Patient given exercises, stretches and lifestyle modifications  See medications in patient instructions if given  Patient will follow up in 4-8 weeks             Note: This dictation was prepared with Dragon dictation along with smaller phrase technology. Any transcriptional errors that result from this process are unintentional.

## 2023-11-17 ENCOUNTER — Telehealth: Payer: Self-pay

## 2023-11-17 NOTE — Telephone Encounter (Signed)
 We received a new referral for this patient for concern for MS and tremors, memory loss. She previously saw Dr Epimenio Foot and is requesting a switch to Dr Terrace Arabia.  Please advise if this is acceptable.  Thanks!

## 2023-11-18 ENCOUNTER — Ambulatory Visit: Payer: Managed Care, Other (non HMO) | Admitting: Family Medicine

## 2023-12-15 NOTE — Progress Notes (Unsigned)
 [name] [address] [place] [address] Phone: [phone] Subjective:   [name], am serving as a scribe for Dr. Antoine Primas.  I'm seeing this patient by the request  of:  Dulce Sellar, NP  CC: Back and neck pain follow-up  [id number]  Kristin [name] is a 38 y.o. female coming in with complaint of back and neck pain. OMT on 04/22/2023. Patient states that she is having numbness in R hand in the last 3 fingers. Symptoms worse with elbow bent. Wonders if cyst is causing symptoms.   Back has not been too bad lately.   Medications patient has been prescribed:   Taking:      Was seen by her primary care in January where patient did have a fairly significant drop in her hemoglobin of 8.6 as well as some elevation in her white blood cell.   Reviewed prior external information including notes and imaging from previsou exam, outside providers and external EMR if available.   As well as notes that were available from care everywhere and other healthcare systems.  Past medical history, social, surgical and family history all reviewed in electronic medical record.  No pertanent information unless stated regarding to the chief complaint.   Past Medical History:  Diagnosis Date   Acute pain of right wrist 07/04/2020   Allergy to amoxicillin 03/01/2016   GBS with sensititivies     AMS (altered mental status) 06/13/2020   Anxiety 08/21/2015   De Quervain's tenosynovitis, right 10/18/2019   Epidermoid cyst of finger of right hand 03/09/2019   Ganglion cyst of volar aspect of right wrist 03/07/2020   Injection March 07, 2020 aspiration done again April 14, 2020 repeat aspiration September 21, 2020 attempted aspiration again December 22, 2020 repeat aspiration Feb 16, 2021 repeat aspiration April 22, 2023     Ganglion cyst of wrist, right 07/05/2020   History of pregnancy induced hypertension--prior pregnancy 03/24/2016   Hypokalemia  08/21/2015   Insomnia 06/28/2020   Kidney stones    Knee joint pain    Left-sided weakness 06/28/2020   Migraine headache 03/02/2014   MS (multiple sclerosis) (HCC) 2021   Nausea in adult 03/02/2014   Nephrolithiasis 03/02/2014   Pars defect of lumbar spine    L5   Piriformis syndrome of left side 12/30/2017   Injected December 30, 2017     Polyhydramnios 10/10/2023   Dx 03/11/16--AFI 27.66, 97%ile--recheck AFI in 2 weeks, weekly BPPs     Positive GBS test 03/24/2016   Pregnancy induced hypertension    Stroke-like symptoms 06/12/2020   Subluxation of extensor carpi ulnaris tendon, left, initial encounter 10/21/2018   Superficial phlebitis 12/02/2019   Syncope    Ureteral calculus 02/24/2014   Vaginal delivery 04/15/2016   Vision disturbance 06/28/2020    Allergies  Allergen Reactions   Amoxicillin Hives    Has patient had a PCN reaction causing immediate rash, facial/tongue/throat swelling, SOB or lightheadedness with hypotension: no  Has patient had a PCN reaction causing severe rash involving mucus membranes or skin necrosis: No  Has patient had a PCN reaction that required hospitalization No  Has patient had a PCN reaction occurring within the last 10 years: No  If all of the above answers are "NO", then may proceed with Cephalosporin use.  Other Reaction(s): Not available     Review of Systems:  No headache, visual changes, nausea, vomiting, diarrhea, constipation, dizziness, abdominal pain, skin rash, fevers, chills, night sweats,  weight loss, swollen lymph nodes, body aches, joint swelling, chest pain, shortness of breath, mood changes. POSITIVE muscle aches  Objective  Blood pressure 122/88, pulse 76, height 5\' 5"  (1.651 m), SpO2 95%.   General: No apparent distress alert and oriented x3 mood and affect normal, dressed appropriately.  HEENT: Pupils equal, extraocular movements intact  Respiratory: Patient's speak in full sentences and does not appear short of  breath  Cardiovascular: No lower extremity edema, non tender, no erythema  Gait MSK:  Back does have pain over the sacroiliac joint.  No significant weakness.  Worsening pain with extension of the back that is consistent with some of the facet arthropathy patient has had.  Unfortunately does have a also a straight leg test on the right side with some radicular symptoms in the L5 distribution. Right wrist exam does have fullness noted on the ventral aspect.  Limited muscular skeletal ultrasound was performed and interpreted by Antoine Primas, M  Limited ultrasound shows a patient does have some hypoechoic changes in the wrist on the ventral aspect more radial side.  With compression though did have a audible pop in did see hypoechoic changes come out of the cyst.  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  Discussed icing regimen and home exercises, discussed which activities to do and which ones to avoid.  Increase activity slowly otherwise.  Continue to work on core strength.  Has the facet arthropathy as well noted.  Continue home exercises.  Follow-up again in 6 to 8 weeks  Pars defect of lumbar spine Also can be contributing.  Discussed icing regimen and home exercises.  Discussed the FABER test at this time.  Follow-up again in 6 to 8 weeks.  Worsening pain can do some medications again which patient has responded well to meloxicam previously.  Ganglion cyst of wrist, right With compression patient did have rupture of the cyst.  Saw it on ultrasound.  Should do relatively well overall but will be slow.  Follow-up again in 6 to 8 weeks.    Nonallopathic problems  Decision today  to treat with OMT was based on Physical Exam  After verbal consent patient was treated with HVLA, ME, FPR techniques in cervical, rib, thoracic, lumbar, and sacral  areas  Patient tolerated the procedure well with improvement in symptoms  Patient given exercises, stretches and lifestyle modifications  See medications in patient instructions if given  Patient will follow up in 4-8 weeks    The above documentation has been reviewed and is accurate and complete [name], DO          Note: This dictation was prepared with Dragon dictation along with smaller phrase technology. Any transcriptional errors that result from this process are unintentional.

## 2023-12-16 ENCOUNTER — Ambulatory Visit (INDEPENDENT_AMBULATORY_CARE_PROVIDER_SITE_OTHER): Payer: Managed Care, Other (non HMO) | Admitting: Family Medicine

## 2023-12-16 ENCOUNTER — Other Ambulatory Visit: Payer: Self-pay

## 2023-12-16 ENCOUNTER — Encounter: Payer: Self-pay | Admitting: Family Medicine

## 2023-12-16 VITALS — BP 122/88 | HR 76 | Ht 65.0 in

## 2023-12-16 DIAGNOSIS — M25531 Pain in right wrist: Secondary | ICD-10-CM

## 2023-12-16 DIAGNOSIS — M9901 Segmental and somatic dysfunction of cervical region: Secondary | ICD-10-CM | POA: Diagnosis not present

## 2023-12-16 DIAGNOSIS — M9908 Segmental and somatic dysfunction of rib cage: Secondary | ICD-10-CM

## 2023-12-16 DIAGNOSIS — M9902 Segmental and somatic dysfunction of thoracic region: Secondary | ICD-10-CM

## 2023-12-16 DIAGNOSIS — M9903 Segmental and somatic dysfunction of lumbar region: Secondary | ICD-10-CM

## 2023-12-16 DIAGNOSIS — M4306 Spondylolysis, lumbar region: Secondary | ICD-10-CM

## 2023-12-16 DIAGNOSIS — M9904 Segmental and somatic dysfunction of sacral region: Secondary | ICD-10-CM

## 2023-12-16 DIAGNOSIS — M67431 Ganglion, right wrist: Secondary | ICD-10-CM

## 2023-12-16 DIAGNOSIS — M533 Sacrococcygeal disorders, not elsewhere classified: Secondary | ICD-10-CM

## 2023-12-16 NOTE — Assessment & Plan Note (Signed)
 Also can be contributing.  Discussed icing regimen and home exercises.  Discussed the FABER test at this time.  Follow-up again in 6 to 8 weeks.  Worsening pain can do some medications again which patient has responded well to meloxicam previously.

## 2023-12-16 NOTE — Assessment & Plan Note (Signed)
 With compression patient did have rupture of the cyst.  Saw it on ultrasound.  Should do relatively well overall but will be slow.  Follow-up again in 6 to 8 weeks.

## 2023-12-16 NOTE — Patient Instructions (Signed)
 Letter for apt See you again in 6-8 weeks

## 2023-12-16 NOTE — Assessment & Plan Note (Signed)
 Chronic problem with exacerbation.  Discussed icing regimen and home exercises, discussed which activities to do and which ones to avoid.  Increase activity slowly otherwise.  Continue to work on core strength.  Has the facet arthropathy as well noted.  Continue home exercises.  Follow-up again in 6 to 8 weeks

## 2024-01-27 ENCOUNTER — Ambulatory Visit: Payer: Self-pay | Admitting: Neurology

## 2024-01-27 ENCOUNTER — Encounter: Payer: Self-pay | Admitting: Neurology

## 2024-01-27 ENCOUNTER — Telehealth: Payer: Self-pay | Admitting: Neurology

## 2024-01-27 VITALS — BP 143/83 | HR 73 | Ht 64.0 in | Wt 229.0 lb

## 2024-01-27 DIAGNOSIS — R9089 Other abnormal findings on diagnostic imaging of central nervous system: Secondary | ICD-10-CM | POA: Diagnosis not present

## 2024-01-27 DIAGNOSIS — R937 Abnormal findings on diagnostic imaging of other parts of musculoskeletal system: Secondary | ICD-10-CM | POA: Insufficient documentation

## 2024-01-27 DIAGNOSIS — R202 Paresthesia of skin: Secondary | ICD-10-CM | POA: Diagnosis not present

## 2024-01-27 MED ORDER — ALPRAZOLAM 1 MG PO TABS: ORAL_TABLET | ORAL | 0 refills | Status: DC

## 2024-01-27 NOTE — Progress Notes (Signed)
 Chief Complaint  Patient presents with   New Patient (Initial Visit)    Pt in 15, here alone  Pt is referred for memory deficit and UE tremors/tingling.       ASSESSMENT AND PLAN  Kristin [name] is a 38 y.o. female   Abnormal MRI of the brain,  Most likely small vessel disease, do have a history of migraine,  She is again concerned about previous abnormal report, despite previous reassurance by MS specialist Dr. Godwin Lat, wants to complete evaluation, will repeat MRI of brain and thoracic spine with and without contrast  Right hand paresthesia  Suggest focal neuropathy,  EMG nerve conduction study  DIAGNOSTIC DATA (LABS, IMAGING, TESTING) - I reviewed patient records, labs, notes, testing and imaging myself where available.   MEDICAL HISTORY:  Kristin [name] is a 38 year old female, seen in request by her PCP NP  Hudnell, Stephanie to follow-up for abnormal MRI of the brain    History is obtained from the patient and review of electronic medical records. I personally reviewed pertinent available imaging films in PACS.   PMHx of  Iron Deficiency anemia Anxiety Hx of kidney stone  She was seen by Dr. Godwin Lat in October 2021 after abnormal MRI of the brain, concern for possibility of multiple sclerosis  She presented with constellation of symptoms, including seizure-like activity, for that reason she had MRI of the brain,  Personally reviewed the film, MRI brain from September 2021, scattered T2/FLAIR hyperintensity signal abnormality, no contrast-enhancement,  MRI of cervical spine showed mild degenerative changes no cord signal abnormality  MRI of thoracic spine there is a faint possible focus adjacent to T4,  Procedures impression was she has functional weakness in her legs, probable nonepileptic seizure, in the setting of anxiety, the spot adductor T4 is likely artifact, findings seen on MRI of the brain was nonspecific, CSF was ordered, but patient did not proceed  with the test  At that time she also had extensive evaluations, vitamin D  deficiency level was 13.4, was supplemented, otherwise normal or  negative TSH, rheumatoid factor, ferritin level, CBC, ANA, angiotensin-converting enzyme level, prolactin, HIV, B12, folic acid , homocystine, MPO antibody, antimyelin associated glycoprotein, Lyme titer, RPR, double-stranded DNA, ANA, mild elevation of anticardiolipin IgM antibody, UDS was negative,  She lost follow-up since 2022, came in today complains of intermittent right hand paresthesia, related to how she put her arm, she works as a Doctor, hospital, also complains couple years history of mental cloudiness, forgetful, has to take frequent notes, walk regularly, also doing weight training occasionally,  Repeat vitamin D  remains decreased 16.7, iron deficiency anemia, hemoglobin was 8.6 in January, on heavy dose of iron, taking Zofran  as needed for nausea, she does have a heavy menstruation  PHYSICAL EXAM:   Vitals:   01/27/24 1559  BP: (!) 143/83  Pulse: 73  Weight: 229 lb (103.9 kg)  Height: 5\' 4"  (1.626 m)   Not recorded     Body mass index is 39.31 kg/m.  PHYSICAL EXAMNIATION:  Gen: NAD, conversant, well nourised, well groomed                     Cardiovascular: Regular rate rhythm, no peripheral edema, warm, nontender. Eyes: Conjunctivae clear without exudates or hemorrhage Neck: Supple, no carotid bruits. Pulmonary: Clear to auscultation bilaterally   NEUROLOGICAL EXAM:  MENTAL STATUS: Speech/cognition: Awake, alert, oriented to history taking and casual conversation    01/27/2024    4:02 PM  [employer]  Cognitive Assessment   Visuospatial/ Executive (0/5) 5  Naming (0/3) 3  Attention: Read list of digits (0/2) 2  Attention: Read list of letters (0/1) 1  Attention: Serial 7 subtraction starting at 100 (0/3) 3  Language: Repeat phrase (0/2) 2  Language : Fluency (0/1) 1  Abstraction (0/2) 2  Delayed Recall (0/5) 2   Orientation (0/6) 6  Total 27    CRANIAL NERVES: CN II: Visual fields are full to confrontation. Pupils are round equal and briskly reactive to light. CN III, IV, VI: extraocular movement are normal. No ptosis. CN V: Facial sensation is intact to light touch CN VII: Face is symmetric with normal eye closure  CN VIII: Hearing is normal to causal conversation. CN IX, X: Phonation is normal. CN XI: Head turning and shoulder shrug are intact  MOTOR: There is no pronator drift of out-stretched arms. Muscle bulk and tone are normal. Muscle strength is normal.  REFLEXES: Reflexes are 1 and symmetric at the biceps, triceps, knees, and ankles. Plantar responses are flexor.  SENSORY: Intact to light touch, pinprick and vibratory sensation are intact in fingers and toes.  COORDINATION: There is no trunk or limb dysmetria noted.  GAIT/STANCE: Posture is normal. Gait is steady    REVIEW OF SYSTEMS:  Full 14 system review of systems performed and notable only for as above All other review of systems were negative.   ALLERGIES: Allergies  Allergen Reactions   Amoxicillin Hives    Has patient had a PCN reaction causing immediate rash, facial/tongue/throat swelling, SOB or lightheadedness with hypotension: no  Has patient had a PCN reaction causing severe rash involving mucus membranes or skin necrosis: No  Has patient had a PCN reaction that required hospitalization No  Has patient had a PCN reaction occurring within the last 10 years: No  If all of the above answers are "NO", then may proceed with Cephalosporin use.  Other Reaction(s): Not available    HOME MEDICATIONS: Current Outpatient Medications  Medication Sig Dispense Refill   Cholecalciferol (VITAMIN D3) 1.25 MG (50000 UT) CAPS Take 1 capsule (1.25 mg total) by mouth once a week. 12 capsule 3   ferrous sulfate  324 MG TBEC Take 1 tablet (324 mg total) by mouth daily with breakfast. 30 tablet 5   ondansetron  (ZOFRAN ) 4  MG tablet Take 1 tablet (4 mg total) by mouth every 8 (eight) hours as needed for nausea or vomiting. 30 tablet 1   albuterol  (VENTOLIN  HFA) 108 (90 Base) MCG/ACT inhaler Inhale into the lungs every 6 (six) hours as needed for wheezing or shortness of breath. (Patient not taking: Reported on 01/27/2024)     No current facility-administered medications for this visit.    PAST MEDICAL HISTORY: Past Medical History:  Diagnosis Date   Acute pain of right wrist 07/04/2020   Allergy to amoxicillin 03/01/2016   GBS with sensititivies     AMS (altered mental status) 06/13/2020   Anxiety 08/21/2015   De Quervain's tenosynovitis, right 10/18/2019   Epidermoid cyst of finger of right hand 03/09/2019   Ganglion cyst of volar aspect of right wrist 03/07/2020   Injection March 07, 2020 aspiration done again April 14, 2020 repeat aspiration September 21, 2020 attempted aspiration again December 22, 2020 repeat aspiration Feb 16, 2021 repeat aspiration April 22, 2023     Ganglion cyst of wrist, right 07/05/2020   History of pregnancy induced hypertension--prior pregnancy 03/24/2016   Hypokalemia 08/21/2015   Insomnia 06/28/2020   Kidney stones  Knee joint pain    Left-sided weakness 06/28/2020   Migraine headache 03/02/2014   MS (multiple sclerosis) (HCC) 2021   Nausea in adult 03/02/2014   Nephrolithiasis 03/02/2014   Pars defect of lumbar spine    L5   Piriformis syndrome of left side 12/30/2017   Injected December 30, 2017     Polyhydramnios 10/10/2023   Dx 03/11/16--AFI 27.66, 97%ile--recheck AFI in 2 weeks, weekly BPPs     Positive GBS test 03/24/2016   Pregnancy induced hypertension    Stroke-like symptoms 06/12/2020   Subluxation of extensor carpi ulnaris tendon, left, initial encounter 10/21/2018   Superficial phlebitis 12/02/2019   Syncope    Ureteral calculus 02/24/2014   Vaginal delivery 04/15/2016   Vision disturbance 06/28/2020    PAST SURGICAL HISTORY: Past Surgical History:   Procedure Laterality Date   EXTRACORPOREAL SHOCK WAVE LITHOTRIPSY Left 01/30/2017   Procedure: LEFT EXTRACORPOREAL SHOCK WAVE LITHOTRIPSY (ESWL);  Surgeon: Marco Severs, MD;  Location: [facility];  Service: Urology;  Laterality: Left;   KIDNEY STONE SURGERY      FAMILY HISTORY: Family History  Problem Relation Age of Onset   Hyperlipidemia Mother    Diabetes Mother    Depression Mother    CVA Mother    Hypertension Mother    Diabetes Mellitus II Mother    Congestive Heart Failure Mother    Stroke Mother    Heart attack Mother    Hyperlipidemia Father    Heart attack Father    CVA Father    Hypertension Father    Diabetes Mellitus II Father    Heart disease Father    Stroke Father    Stroke Sister    Stroke Maternal Grandmother    Hypertension Maternal Grandmother    Hyperlipidemia Maternal Grandmother    Heart disease Maternal Grandmother    Diabetes Maternal Grandmother    Cancer Maternal Grandfather    Diabetes Maternal Grandfather     SOCIAL HISTORY: Social History   Socioeconomic History   Marital status: Married    Spouse name: Alden Amato   Number of children: 2   Years of education: BA   Highest education level: Not on file  Occupational History   Occupation: [employer]  Tobacco Use   Smoking status: Never   Smokeless tobacco: Never  Vaping Use   Vaping status: Never Used  Substance and Sexual Activity   Alcohol use: Yes    Alcohol/week: 1.0 standard drink of alcohol    Types: 1 Glasses of wine per week    Comment: rarely   Drug use: Never   Sexual activity: Yes    Birth control/protection: None  Other Topics Concern   Not on file  Social History Narrative   Right handed   No caffeine use   Lives with husband and two kids   Social Drivers of Corporate investment banker Strain: Not on file  Food Insecurity: Not on file  Transportation Needs: Not on file  Physical Activity: Not on file  Stress: Not on file  Social  Connections: Not on file  Intimate Partner Violence: Not on file      Phebe Brasil, M.D. Ph.D.  [facility] [address] [place], [place] [address] Ph: [phone] Fax: [phone]  CC:  Versa Gore, NP [address] [place],  [place] [address]  Versa Gore, NP

## 2024-01-27 NOTE — Telephone Encounter (Signed)
 Appointment details confirmed

## 2024-01-28 ENCOUNTER — Telehealth: Payer: Self-pay | Admitting: Neurology

## 2024-01-28 NOTE — Telephone Encounter (Signed)
 sent to GI they obtain [place] N[facility] [phone]

## 2024-02-02 ENCOUNTER — Encounter: Payer: Self-pay | Admitting: Neurology

## 2024-02-04 NOTE — Progress Notes (Deleted)
 [facility] [address] [place] [address] Phone: [phone] Subjective:    I'm seeing this patient by the request  of:  Versa Gore, NP  CC:   [id number]  Kristin [name] is a 38 y.o. female coming in with complaint of back and neck pain. OMT on 12/16/2023. Patient states   Medications patient has been prescribed:   Taking:         Reviewed prior external information including notes and imaging from previsou exam, outside providers and external EMR if available.   As well as notes that were available from care everywhere and other healthcare systems.  Past medical history, social, surgical and family history all reviewed in electronic medical record.  No pertanent information unless stated regarding to the chief complaint.   Past Medical History:  Diagnosis Date   Acute pain of right wrist 07/04/2020   Allergy to amoxicillin 03/01/2016   GBS with sensititivies     AMS (altered mental status) 06/13/2020   Anxiety 08/21/2015   De Quervain's tenosynovitis, right 10/18/2019   Epidermoid cyst of finger of right hand 03/09/2019   Ganglion cyst of volar aspect of right wrist 03/07/2020   Injection March 07, 2020 aspiration done again April 14, 2020 repeat aspiration September 21, 2020 attempted aspiration again December 22, 2020 repeat aspiration Feb 16, 2021 repeat aspiration April 22, 2023     Ganglion cyst of wrist, right 07/05/2020   History of pregnancy induced hypertension--prior pregnancy 03/24/2016   Hypokalemia 08/21/2015   Insomnia 06/28/2020   Kidney stones    Knee joint pain    Left-sided weakness 06/28/2020   Migraine headache 03/02/2014   MS (multiple sclerosis) (HCC) 2021   Nausea in adult 03/02/2014   Nephrolithiasis 03/02/2014   Pars defect of lumbar spine    L5   Piriformis syndrome of left side 12/30/2017   Injected December 30, 2017     Polyhydramnios 10/10/2023   Dx 03/11/16--AFI 27.66, 97%ile--recheck AFI in  2 weeks, weekly BPPs     Positive GBS test 03/24/2016   Pregnancy induced hypertension    Stroke-like symptoms 06/12/2020   Subluxation of extensor carpi ulnaris tendon, left, initial encounter 10/21/2018   Superficial phlebitis 12/02/2019   Syncope    Ureteral calculus 02/24/2014   Vaginal delivery 04/15/2016   Vision disturbance 06/28/2020    Allergies  Allergen Reactions   Amoxicillin Hives    Has patient had a PCN reaction causing immediate rash, facial/tongue/throat swelling, SOB or lightheadedness with hypotension: no  Has patient had a PCN reaction causing severe rash involving mucus membranes or skin necrosis: No  Has patient had a PCN reaction that required hospitalization No  Has patient had a PCN reaction occurring within the last 10 years: No  If all of the above answers are "NO", then may proceed with Cephalosporin use.  Other Reaction(s): Not available     Review of Systems:  No headache, visual changes, nausea, vomiting, diarrhea, constipation, dizziness, abdominal pain, skin rash, fevers, chills, night sweats, weight loss, swollen lymph nodes, body aches, joint swelling, chest pain, shortness of breath, mood changes. POSITIVE muscle aches  Objective  There were no vitals taken for this visit.   General: No apparent distress alert and oriented x3 mood and affect normal, dressed appropriately.  HEENT: Pupils equal, extraocular movements intact  Respiratory: Patient's speak in full sentences and does not appear short of breath  Cardiovascular: No lower extremity edema, non tender, no erythema  Gait  MSK:  Back   Osteopathic findings  C2 flexed rotated and side bent right C6 flexed rotated and side bent left T3 extended rotated and side bent right inhaled rib T9 extended rotated and side bent left L2 flexed rotated and side bent right Sacrum right on right       Assessment and Plan:  No problem-specific Assessment & Plan notes found for this  encounter.    Nonallopathic problems  Decision today to treat with OMT was based on Physical Exam  After verbal consent patient was treated with HVLA, ME, FPR techniques in cervical, rib, thoracic, lumbar, and sacral  areas  Patient tolerated the procedure well with improvement in symptoms  Patient given exercises, stretches and lifestyle modifications  See medications in patient instructions if given  Patient will follow up in 4-8 weeks             Note: This dictation was prepared with Dragon dictation along with smaller phrase technology. Any transcriptional errors that result from this process are unintentional.

## 2024-02-05 ENCOUNTER — Ambulatory Visit: Admitting: Family Medicine

## 2024-03-04 ENCOUNTER — Other Ambulatory Visit: Payer: Self-pay

## 2024-03-04 ENCOUNTER — Emergency Department (HOSPITAL_COMMUNITY)

## 2024-03-04 ENCOUNTER — Emergency Department (HOSPITAL_COMMUNITY)
Admission: EM | Admit: 2024-03-04 | Discharge: 2024-03-05 | Disposition: A | Discharge status: Home / Self Care | Attending: Emergency Medicine | Admitting: Emergency Medicine

## 2024-03-04 DIAGNOSIS — R0600 Dyspnea, unspecified: Secondary | ICD-10-CM | POA: Diagnosis not present

## 2024-03-04 DIAGNOSIS — R079 Chest pain, unspecified: Secondary | ICD-10-CM | POA: Diagnosis present

## 2024-03-04 DIAGNOSIS — I10 Essential (primary) hypertension: Secondary | ICD-10-CM | POA: Diagnosis not present

## 2024-03-04 LAB — BASIC METABOLIC PANEL WITH GFR
Anion gap: 8 (ref 5–15)
BUN: 14 mg/dL (ref 6–20)
CO2: 21 mmol/L — ABNORMAL LOW (ref 22–32)
Calcium: 8.9 mg/dL (ref 8.9–10.3)
Chloride: 109 mmol/L (ref 98–111)
Creatinine, Ser: 0.7 mg/dL (ref 0.44–1.00)
GFR, Estimated: >60 mL/min (ref >60)
Glucose, Bld: 99 mg/dL (ref 70–99)
Potassium: 3.7 mmol/L (ref 3.5–5.1)
Sodium: 138 mmol/L (ref 135–145)

## 2024-03-04 LAB — TROPONIN I (HIGH SENSITIVITY)
Troponin I (High Sensitivity): 2 ng/L (ref <18)
Troponin I (High Sensitivity): 2 ng/L (ref <18)

## 2024-03-04 LAB — CBC
HCT: 29.8 % — ABNORMAL LOW (ref 36.0–46.0)
Hemoglobin: 8.8 g/dL — ABNORMAL LOW (ref 12.0–15.0)
MCH: 21.1 pg — ABNORMAL LOW (ref 26.0–34.0)
MCHC: 29.5 g/dL — ABNORMAL LOW (ref 30.0–36.0)
MCV: 71.5 fL — ABNORMAL LOW (ref 80.0–100.0)
Platelets: 396 10*3/uL (ref 150–400)
RBC: 4.17 MIL/uL (ref 3.87–5.11)
RDW: 16.8 % — ABNORMAL HIGH (ref 11.5–15.5)
WBC: 11.2 10*3/uL — ABNORMAL HIGH (ref 4.0–10.5)
nRBC: 0 % (ref 0.0–0.2)

## 2024-03-04 LAB — PREGNANCY, URINE: Preg Test, Ur: NEGATIVE

## 2024-03-04 NOTE — ED Provider Notes (Signed)
 [place] EMERGENCY DEPARTMENT AT [facility] Provider Note   CSN: [id number] Arrival date & time: 03/04/24  1756     Patient presents with: Shortness of Breath, Chest Pain, and Back Pain   Kristin [name] is a 38 y.o. female with past medical history of HTN, MS presents emergency department for evaluation of chest pain and shortness of breath that started at 10:00 this morning.  Prior to this, she endorses some bodyaches this morning at 0200.  Chest pain and shortness of breath started together while taking pictures for graduation outside in the heat.  No congestion, cough, fevers   Shortness of Breath Associated symptoms: chest pain   Chest Pain Associated symptoms: back pain and shortness of breath   Back Pain Associated symptoms: chest pain        Prior to Admission medications   Medication Sig Start Date End Date Taking? Authorizing Provider  albuterol  (VENTOLIN  HFA) 108 (90 Base) MCG/ACT inhaler Inhale into the lungs every 6 (six) hours as needed for wheezing or shortness of breath. Patient not taking: Reported on 01/27/2024    [provider]  ALPRAZolam  (XANAX ) 1 MG tablet Take 1-2 tablets 30 minutes prior to MRI, may repeat once as needed. Must have driver. 01/27/24   Phebe Brasil, MD  Cholecalciferol (VITAMIN D3) 1.25 MG (50000 UT) CAPS Take 1 capsule (1.25 mg total) by mouth once a week. 10/14/23   Versa Gore, NP  ferrous sulfate  324 MG TBEC Take 1 tablet (324 mg total) by mouth daily with breakfast. 10/17/23   Versa Gore, NP  ondansetron  (ZOFRAN ) 4 MG tablet Take 1 tablet (4 mg total) by mouth every 8 (eight) hours as needed for nausea or vomiting. 10/22/23   Versa Gore, NP    Allergies: Amoxicillin    Review of Systems  Respiratory:  Positive for shortness of breath.   Cardiovascular:  Positive for chest pain.  Musculoskeletal:  Positive for back pain.    Updated Vital Signs BP (!) 126/97   Pulse 67   Temp 99.1 F (37.3 C)  (Oral)   Resp 14   SpO2 100%   Physical Exam Vitals and nursing note reviewed.  Constitutional:      General: She is not in acute distress.    Appearance: Normal appearance.  HENT:     Head: Normocephalic and atraumatic.   Eyes:     General: Lids are normal. Vision grossly intact. No visual field deficit.    Extraocular Movements:     Right eye: Normal extraocular motion and no nystagmus.     Left eye: Normal extraocular motion and no nystagmus.     Conjunctiva/sclera: Conjunctivae normal.    Cardiovascular:     Rate and Rhythm: Normal rate.  Pulmonary:     Effort: Pulmonary effort is normal. No respiratory distress.     Breath sounds: Normal breath sounds.   Musculoskeletal:     Right lower leg: No tenderness. No edema.     Left lower leg: No tenderness. No edema.   Skin:    General: Skin is warm.     Coloration: Skin is not jaundiced or pale.   Neurological:     Mental Status: She is alert and oriented to person, place, and time. Mental status is at baseline.     GCS: GCS eye subscore is 4. GCS verbal subscore is 5. GCS motor subscore is 6.     Cranial Nerves: No cranial nerve deficit, dysarthria or facial asymmetry.  Motor: No weakness, tremor, atrophy, abnormal muscle tone, seizure activity or pronator drift.     Coordination: Coordination is intact.     Gait: Gait is intact.     (all labs ordered are listed, but only abnormal results are displayed) Labs Reviewed  BASIC METABOLIC PANEL WITH GFR - Abnormal; Notable for the following components:      Result Value   CO2 21 (*)    All other components within normal limits  CBC - Abnormal; Notable for the following components:   WBC 11.2 (*)    Hemoglobin 8.8 (*)    HCT 29.8 (*)    MCV 71.5 (*)    MCH 21.1 (*)    MCHC 29.5 (*)    RDW 16.8 (*)    All other components within normal limits  PREGNANCY, URINE  TROPONIN I (HIGH SENSITIVITY)  TROPONIN I (HIGH SENSITIVITY)    EKG: EKG  Interpretation Date/Time:  Thursday March 04 2024 18:13:58 EDT Ventricular Rate:  90 PR Interval:  167 QRS Duration:  79 QT Interval:  351 QTC Calculation: 430 R Axis:   23  Text Interpretation: Sinus rhythm ST elev, probable normal early repol pattern Baseline wander in lead(s) V6 Confirmed by Jerald Molly [phone]) on 03/04/2024 10:27:08 PM  Radiology: DG Chest 2 View Result Date: 03/04/2024 CLINICAL DATA:  Chest pain and shortness of breath. EXAM: CHEST - 2 VIEW COMPARISON:  10/10/2023. FINDINGS: The heart size and mediastinal contours are within normal limits. Both lungs are clear. No pleural effusion or pneumothorax. No acute osseous abnormality. IMPRESSION: No acute cardiopulmonary findings. Electronically Signed   By: Mannie Seek M.D.   On: 03/04/2024 18:54     Procedures   Medications Ordered in the ED - No data to display              HEART Score: 1                    Medical Decision Making Amount and/or Complexity of Data Reviewed Labs: ordered. Radiology: ordered.   Patient presents to the ED for concern of CP, SHOB, this involves an extensive number of treatment options, and is a complaint that carries with it a high risk of complications and morbidity.  The differential diagnosis includes ACS, PE, asthma exacerbation, dehydration, pneumonia, fluid overload, symptomatic anemia, pregnancy, electrolyte abnormality   Co morbidities that complicate the patient evaluation  See HPI   Additional history obtained:  Additional history obtained from Nursing   External records from outside source obtained and reviewed including triage RN note   Lab Tests:  I Ordered, and personally interpreted labs.  The pertinent results include:   WBC 11.2 Hgb 8.8 (was 8.64 months ago) -known diagnosis of IDA hCG neg Trop neg x 2   Imaging Studies ordered:  I ordered imaging studies including CXR  I independently visualized and interpreted imaging which showed no  acute cardiopulmonary pathology I agree with the radiologist interpretation   Cardiac Monitoring:  The patient was maintained on a cardiac monitor.  I personally viewed and interpreted the cardiac monitored which showed an underlying rhythm of: Sinus rhythm of 90 bpm     Problem List / ED Course:  CP SHOB EKG NSR Troponin negative x 2.  hCG negative. X-ray shows no pneumonia nor fluid.  Patient does not appear fluid overloaded on exam Lung sounds CTAB Homans' sign negative x 2 with no risk factors for DVT reported by patient.  Although patient was mildly  tachycardic at arrival at 106 bpm, she has been 67-75 for the rest of her stay in ED.  She reports complete resolution of chest pain and shortness of breath without any intervention. Low suspicion for PE as patient is PERC negative.  Able to ambulate wo CP, SHOB, hypoxia Heart score 1. Will refer patient to PCP for further workup Anemia Followed by PCP for this.  Noted to have low iron.  Was placed on iron supplementation. Does not appear to be symptomatic with no complaints of dizziness, lightheadedness, nor neurodeficits. Was 8.64 months ago so this appears stable for her. Denies significant vaginal bleeding, melena, BRBPR   Reevaluation:  After the interventions noted above, I reevaluated the patient and found that they have :resolved     Dispostion:  After consideration of the diagnostic results and the patients response to treatment, I feel that the patent would benefit from outpatient management with PCP f/u.    Discussed ED workup, disposition, return to ED precautions with patient who expresses understanding agrees with plan.  All questions answered to their satisfaction.  They are agreeable to plan.  Discharge instructions provided on paperwork  Discussed patient, ED workup with Dr. Gordon Latus who reviewed ED workup and agrees to plan  Final diagnoses:  Chest pain, unspecified type  Dyspnea, unspecified type     ED Discharge Orders     None          Vester, [place], PA 03/05/24 0041    Arvilla Birmingham, MD 03/05/24 1556

## 2024-03-04 NOTE — ED Provider Notes (Incomplete)
 [place] EMERGENCY DEPARTMENT AT [facility] Provider Note   CSN: [id number] Arrival date & time: 03/04/24  1756     Patient presents with: Shortness of Breath, Chest Pain, and Back Pain   Kristin [name] is a 38 y.o. female with past medical history of HTN, MS presents emergency department for evaluation of chest pain and shortness of breath that started at 10:00 this morning.  Prior to this, she endorses some bodyaches this morning at 0200.  Chest pain and shortness of breath started together while taking pictures for graduation outside in the heat.  No congestion, cough, fevers   Shortness of Breath Associated symptoms: chest pain   Chest Pain Associated symptoms: back pain and shortness of breath   Back Pain Associated symptoms: chest pain        Prior to Admission medications   Medication Sig Start Date End Date Taking? Authorizing Provider  albuterol  (VENTOLIN  HFA) 108 (90 Base) MCG/ACT inhaler Inhale into the lungs every 6 (six) hours as needed for wheezing or shortness of breath. Patient not taking: Reported on 01/27/2024    [provider]  ALPRAZolam  (XANAX ) 1 MG tablet Take 1-2 tablets 30 minutes prior to MRI, may repeat once as needed. Must have driver. 01/27/24   Phebe Brasil, MD  Cholecalciferol (VITAMIN D3) 1.25 MG (50000 UT) CAPS Take 1 capsule (1.25 mg total) by mouth once a week. 10/14/23   Versa Gore, NP  ferrous sulfate  324 MG TBEC Take 1 tablet (324 mg total) by mouth daily with breakfast. 10/17/23   Versa Gore, NP  ondansetron  (ZOFRAN ) 4 MG tablet Take 1 tablet (4 mg total) by mouth every 8 (eight) hours as needed for nausea or vomiting. 10/22/23   Versa Gore, NP    Allergies: Amoxicillin    Review of Systems  Respiratory:  Positive for shortness of breath.   Cardiovascular:  Positive for chest pain.  Musculoskeletal:  Positive for back pain.    Updated Vital Signs BP (!) 126/97   Pulse 67   Temp 99.1 F (37.3 C)  (Oral)   Resp 14   SpO2 100%   Physical Exam Vitals and nursing note reviewed.  Constitutional:      General: She is not in acute distress.    Appearance: Normal appearance.  HENT:     Head: Normocephalic and atraumatic.   Eyes:     General: Lids are normal. Vision grossly intact. No visual field deficit.    Extraocular Movements:     Right eye: Normal extraocular motion and no nystagmus.     Left eye: Normal extraocular motion and no nystagmus.     Conjunctiva/sclera: Conjunctivae normal.    Cardiovascular:     Rate and Rhythm: Normal rate.  Pulmonary:     Effort: Pulmonary effort is normal. No respiratory distress.     Breath sounds: Normal breath sounds.   Musculoskeletal:     Right lower leg: No tenderness. No edema.     Left lower leg: No tenderness. No edema.   Skin:    General: Skin is warm.     Coloration: Skin is not jaundiced or pale.   Neurological:     Mental Status: She is alert and oriented to person, place, and time. Mental status is at baseline.     GCS: GCS eye subscore is 4. GCS verbal subscore is 5. GCS motor subscore is 6.     Cranial Nerves: No cranial nerve deficit, dysarthria or facial asymmetry.  Motor: No weakness, tremor, atrophy, abnormal muscle tone, seizure activity or pronator drift.     Coordination: Coordination is intact.     Gait: Gait is intact.     (all labs ordered are listed, but only abnormal results are displayed) Labs Reviewed  BASIC METABOLIC PANEL WITH GFR - Abnormal; Notable for the following components:      Result Value   CO2 21 (*)    All other components within normal limits  CBC - Abnormal; Notable for the following components:   WBC 11.2 (*)    Hemoglobin 8.8 (*)    HCT 29.8 (*)    MCV 71.5 (*)    MCH 21.1 (*)    MCHC 29.5 (*)    RDW 16.8 (*)    All other components within normal limits  PREGNANCY, URINE  TROPONIN I (HIGH SENSITIVITY)  TROPONIN I (HIGH SENSITIVITY)    EKG: EKG  Interpretation Date/Time:  Thursday March 04 2024 18:13:58 EDT Ventricular Rate:  90 PR Interval:  167 QRS Duration:  79 QT Interval:  351 QTC Calculation: 430 R Axis:   23  Text Interpretation: Sinus rhythm ST elev, probable normal early repol pattern Baseline wander in lead(s) V6 Confirmed by Jerald Molly [phone]) on 03/04/2024 10:27:08 PM  Radiology: DG Chest 2 View Result Date: 03/04/2024 CLINICAL DATA:  Chest pain and shortness of breath. EXAM: CHEST - 2 VIEW COMPARISON:  10/10/2023. FINDINGS: The heart size and mediastinal contours are within normal limits. Both lungs are clear. No pleural effusion or pneumothorax. No acute osseous abnormality. IMPRESSION: No acute cardiopulmonary findings. Electronically Signed   By: Mannie Seek M.D.   On: 03/04/2024 18:54    {Document cardiac monitor, telemetry assessment procedure when appropriate:32947} Procedures   Medications Ordered in the ED - No data to display    {Click here for ABCD2, HEART and other calculators REFRESH Note before signing:1}                              Medical Decision Making Amount and/or Complexity of Data Reviewed Labs: ordered. Radiology: ordered.   Patient presents to the ED for concern of CP, SHOB, this involves an extensive number of treatment options, and is a complaint that carries with it a high risk of complications and morbidity.  The differential diagnosis includes ACS, PE, asthma exacerbation, dehydration, pneumonia, fluid overload, symptomatic anemia, pregnancy, electrolyte abnormality   Co morbidities that complicate the patient evaluation  See HPI   Additional history obtained:  Additional history obtained from Nursing   External records from outside source obtained and reviewed including triage RN note   Lab Tests:  I Ordered, and personally interpreted labs.  The pertinent results include:   WBC 11.2 Hgb 8.8 (was 8.64 months ago) -known diagnosis of IDA hCG neg Trop neg x  2   Imaging Studies ordered:  I ordered imaging studies including CXR  I independently visualized and interpreted imaging which showed no acute cardiopulmonary pathology I agree with the radiologist interpretation   Cardiac Monitoring:  The patient was maintained on a cardiac monitor.  I personally viewed and interpreted the cardiac monitored which showed an underlying rhythm of: Sinus rhythm of 90 bpm     Problem List / ED Course:  CP SHOB EKG NSR Troponin negative x 2.  hCG negative. X-ray shows no pneumonia nor fluid.  Patient does not appear fluid overloaded on exam Lung sounds CTAB Homans' sign negative  x 2 with no risk factors for DVT reported by patient.  Although patient was mildly tachycardic at arrival at 106 bpm, she has been 67-75 for the rest of her stay in ED.  She reports complete resolution of chest pain and shortness of breath without any intervention. Low suspicion for PE as patient is PERC negative.  Able to ambulate wo CP, SHOB, hypoxia Anemia Followed by PCP for this.  Noted to have low iron.  Was placed on iron supplementation. Does not appear to be symptomatic with no complaints of dizziness, lightheadedness, nor neurodeficits. Was 8.64 months ago so this appears stable for her. Denies significant vaginal bleeding, melena, BRBPR   Reevaluation:  After the interventions noted above, I reevaluated the patient and found that they have :resolved     Dispostion:  After consideration of the diagnostic results and the patients response to treatment, I feel that the patent would benefit from outpatient management with PCP f/u.    Discussed ED workup, disposition, return to ED precautions with patient who expresses understanding agrees with plan.  All questions answered to their satisfaction.  They are agreeable to plan.  Discharge instructions provided on paperwork  Discussed patient, ED workup with Dr. Gordon Latus who reviewed ED workup and agrees to  plan  Final diagnoses:  None    ED Discharge Orders     None

## 2024-03-04 NOTE — ED Provider Triage Note (Signed)
 Emergency Medicine Provider Triage Evaluation Note  Kristin [name] , a 38 y.o. female  was evaluated in triage.  Pt complains of SHOB and CP that started at 1030 this morning at daughter graduation. Hx of MS  CP tightness 8/10 constant. Worsens with walking  Complains of body aches earlier this morning  Review of Systems  Positive: See hpi Negative: Cough, congestion, fevers  Physical Exam  BP (!) 209/105   Pulse (!) 106   Temp 99.1 F (37.3 C) (Oral)   Resp (!) 22   SpO2 100%  Gen:   Awake, no distress  Resp:  Normal effort  MSK:   Moves extremities without difficulty  Other:  No pedal edema  Medical Decision Making  Medically screening exam initiated at 6:37 PM.  Appropriate orders placed.  Johnny Nanas was informed that the remainder of the evaluation will be completed by another provider, this initial triage assessment does not replace that evaluation, and the importance of remaining in the ED until their evaluation is complete.  Workup started   Cherrie, Franca, [place] 03/04/24 [phone]

## 2024-03-05 NOTE — Discharge Instructions (Signed)
 Thank you last evaluate you today.  Your troponins were negative for heart injury.  Your chest x-ray did not show any fluid nor pneumonia.  Please follow-up with primary care provider for further management  Return to the department experience chest pain, shortness of breath

## 2024-03-06 ENCOUNTER — Ambulatory Visit
Admission: RE | Admit: 2024-03-06 | Discharge: 2024-03-06 | Disposition: A | Discharge status: Home / Self Care | Source: Ambulatory Visit | Attending: Neurology | Admitting: Neurology

## 2024-03-06 DIAGNOSIS — R9089 Other abnormal findings on diagnostic imaging of central nervous system: Secondary | ICD-10-CM

## 2024-03-06 DIAGNOSIS — R202 Paresthesia of skin: Secondary | ICD-10-CM

## 2024-03-06 DIAGNOSIS — R937 Abnormal findings on diagnostic imaging of other parts of musculoskeletal system: Secondary | ICD-10-CM

## 2024-03-06 MED ORDER — GADOPICLENOL 0.5 MMOL/ML IV SOLN
10.0000 mL | Freq: Once | INTRAVENOUS | Status: AC | PRN
  Administered 2024-03-06: 10 mL via INTRAVENOUS

## 2024-03-08 ENCOUNTER — Telehealth: Payer: Self-pay | Admitting: Neurology

## 2024-03-08 ENCOUNTER — Ambulatory Visit: Payer: Self-pay | Admitting: Neurology

## 2024-03-08 NOTE — Telephone Encounter (Signed)
 MYC conf

## 2024-03-10 ENCOUNTER — Encounter: Admitting: Neurology

## 2024-05-03 ENCOUNTER — Telehealth: Payer: Self-pay | Admitting: Neurology

## 2024-05-03 NOTE — Telephone Encounter (Signed)
 MYC conf

## 2024-05-03 NOTE — Telephone Encounter (Signed)
 Pt r/s her NCS as a result of a conflict at work

## 2024-05-05 ENCOUNTER — Encounter: Admitting: Neurology

## 2024-06-23 ENCOUNTER — Ambulatory Visit: Admitting: Neurology

## 2024-06-23 ENCOUNTER — Encounter: Payer: Self-pay | Admitting: Neurology

## 2024-06-23 VITALS — BP 138/86 | HR 84 | Resp 17 | Ht 65.0 in

## 2024-06-23 DIAGNOSIS — R937 Abnormal findings on diagnostic imaging of other parts of musculoskeletal system: Secondary | ICD-10-CM

## 2024-06-23 DIAGNOSIS — R9089 Other abnormal findings on diagnostic imaging of central nervous system: Secondary | ICD-10-CM

## 2024-06-23 DIAGNOSIS — G5603 Carpal tunnel syndrome, bilateral upper limbs: Secondary | ICD-10-CM | POA: Diagnosis not present

## 2024-06-23 DIAGNOSIS — R202 Paresthesia of skin: Secondary | ICD-10-CM

## 2024-06-23 NOTE — Progress Notes (Signed)
 EMG nerve conduction study showed bilateral carpal tunnel syndromes, candidate for conservative treatment, suggest wrist splint

## 2024-06-23 NOTE — Procedures (Signed)
 Full Name: Kristin [name] Gender: Female MRN #: [id number] Date of Birth: January 16, 1986    Visit Date: 06/23/2024 11:39 Age: 38 Years Examining Physician: Onita Duos Referring Physician: Onita Duos Height: 5 feet 5 inch History: 38 year old female with intermittent bilateral hands paresthesia  Summary of the test: EMG nerve conduction study Bilateral ulnar sensory motor responses were normal.  Bilateral median sensory responses showed mildly prolonged peak latency right worse than left, right side also had moderately decreased snap amplitude.  Right median motor response showed mildly prolonged distal latency, left side has borderline prolonged distal latency with normal CMAP amplitude, conduction velocity.  Electromyography: Selected needle examination of bilateral upper extremity muscles were performed.  The only abnormality is mildly decreased recruitment pattern at the right abductor pollicis brevis, there was no spontaneous activity at bilateral abductor pollicis brevis.  Conclusion: This is an abnormal study.  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    ------------------------------- Duos Onita. M.D. Ph.D.   [facility] [address] [place], [place] [address] Tel: [phone] Fax: [phone]  Verbal informed consent was obtained from the patient, patient was informed of potential risk of procedure, including bruising, bleeding, hematoma formation, infection, muscle weakness, muscle pain, numbness, among others.        MNC    Nerve / Sites Muscle Latency Ref. Amplitude Ref. Rel Amp Segments Distance Velocity Ref. Area    ms ms mV mV %  cm m/s m/s mVms  R Median - APB     Wrist APB 5.5 <=4.4 5.3 >=4.0 100 Wrist - APB 7   16.5     Upper arm APB 9.6  4.8  91.4 Upper arm - Wrist 23 57 >=49 15.2   L Median - APB     Wrist APB 4.4 <=4.4 6.5 >=4.0 100 Wrist - APB 7   18.9     Upper arm APB 8.1  6.3  97.2 Upper arm - Wrist 22 59 >=49 19.0  R Ulnar - ADM     Wrist ADM 2.5 <=3.3 7.1 >=6.0 100 Wrist - ADM 7   24.5     B.Elbow ADM 4.7  6.5  91.9 B.Elbow - Wrist 14 63 >=49 25.2     A.Elbow ADM 6.8  6.7  103 A.Elbow - B.Elbow 13 63 >=49 25.5  L Ulnar - ADM     Wrist ADM 2.6 <=3.3 8.8 >=6.0 100 Wrist - ADM 7   31.7     B.Elbow ADM 5.8  7.9  89.6 B.Elbow - Wrist 19 60 >=49 28.0     A.Elbow ADM 7.7  8.6  108 A.Elbow - B.Elbow 12 62 >=49 31.7             SNC    Nerve / Sites Rec. Site Peak Lat Ref.  Amp Ref. Segments Distance    ms ms V V  cm  R Median - Orthodromic (Dig II, Mid palm)     Dig II Wrist 4.4 <=3.4 6 >=10 Dig II - Wrist 13  L Median - Orthodromic (Dig II, Mid palm)     Dig II Wrist 4.0 <=3.4 12 >=10 Dig II - Wrist 13  R Ulnar - Orthodromic, (Dig V, Mid palm)     Dig V Wrist 2.5 <=3.1 8 >=5 Dig V - Wrist 11  L Ulnar - Orthodromic, (Dig  V, Mid palm)     Dig V Wrist 2.8 <=3.1 10 >=5 Dig V - Wrist 36             F  Wave    Nerve F Lat Ref.   ms ms  R Ulnar - ADM 26.2 <=32.0  L Ulnar - ADM 25.7 <=32.0         EMG Summary Table    Spontaneous MUAP Recruitment  Muscle IA Fib PSW Fasc Other Amp Dur. Poly Pattern  R. First dorsal interosseous Normal None None None _______ Normal Normal Normal Normal  R. Abductor pollicis brevis Normal None None None _______ Normal Normal Normal Reduced  R. Pronator teres Normal None None None _______ Normal Normal Normal Normal  R. Biceps brachii Normal None None None _______ Normal Normal Normal Normal  R. Deltoid Normal None None None _______ Normal Normal Normal Normal  R. Triceps brachii Normal None None None _______ Normal Normal Normal Normal  R. Brachioradialis Normal None None None _______ Normal Normal Normal Normal  R. Extensor digitorum communis Normal None None None _______ Normal Normal Normal Normal  L. Abductor pollicis  brevis Normal None None None _______ Normal Normal Normal Normal

## 2024-09-18 ENCOUNTER — Encounter (HOSPITAL_COMMUNITY): Payer: Self-pay

## 2024-09-18 ENCOUNTER — Ambulatory Visit (HOSPITAL_COMMUNITY)
Admission: EM | Admit: 2024-09-18 | Discharge: 2024-09-18 | Disposition: A | Discharge status: Home / Self Care | Attending: Family Medicine | Admitting: Family Medicine

## 2024-09-18 DIAGNOSIS — J111 Influenza due to unidentified influenza virus with other respiratory manifestations: Secondary | ICD-10-CM | POA: Diagnosis not present

## 2024-09-18 LAB — POC COVID19/FLU A&B COMBO
Covid Antigen, POC: NEGATIVE
Influenza A Antigen, POC: NEGATIVE
Influenza B Antigen, POC: NEGATIVE

## 2024-09-18 MED ORDER — OSELTAMIVIR PHOSPHATE 75 MG PO CAPS: 75.0000 mg | ORAL_CAPSULE | Freq: Two times a day (BID) | ORAL | 0 refills | Status: AC

## 2024-09-18 NOTE — Discharge Instructions (Signed)
 Your COVID/flu swab was negative, however you reported your daughter was positive for flu last week.  Because of this, you will be treated as if you are positive.  You have been given a prescription for Tamiflu .  Take 1 capsule twice a day for 5 days.  Influenza is a viral illness which will improve with rest, fluids, and medications to help with your symptoms.  Tylenol  and/or ibuprofen  for fever, body aches, pain. Guaifenesin  (plain mucinex ) and saline nasal sprays may help relieve symptoms.  2 teaspoons of honey and 1 cup of warm water every 4-6 hours may help with throat pains.  Salt water gargles, 1/2-1 teaspoon salt dissolved in 1 cup warm water, gargle and spit out several times daily for sore throat.  Humidifier in room at nighttime may help sooth cough (clean well daily).  For chest pain, shortness of breath, inability to keep food or fluids down without vomiting, fever that does not respond to Tylenol  or ibuprofen , or any other severe symptoms, please go to the ER for further evaluation.  Return to urgent care as needed, otherwise follow-up with PCP.

## 2024-09-18 NOTE — ED Provider Notes (Signed)
 " [facility]    CSN: [id number] Arrival date & time: 09/18/24  1536      History   Chief Complaint Chief Complaint  Patient presents with   Headache   Cough   Fever    HPI CRESTINA STRIKE is a 38 y.o. female.   This 38 year old female is being seen for acute onset of headache, fever, chills, body aches yesterday.  She reports her daughter tested positive and was treated for influenza last week.  She has chronic nasal congestion, and says that she has not noticed any change.  She endorses a mild cough.  She denies ear pain, sore throat.  She denies shortness of breath, chest pain, abdominal pain, nausea, vomiting, diarrhea.   Headache Associated symptoms: congestion, cough, fever and myalgias   Associated symptoms: no abdominal pain, no diarrhea, no dizziness, no ear pain, no nausea, no sore throat and no vomiting   Cough Associated symptoms: chills, fever, headaches and myalgias   Associated symptoms: no chest pain, no ear pain, no rash, no shortness of breath and no sore throat   Fever Associated symptoms: chills, congestion, cough, headaches and myalgias   Associated symptoms: no chest pain, no diarrhea, no ear pain, no nausea, no rash, no sore throat and no vomiting     Past Medical History:  Diagnosis Date   Acute pain of right wrist 07/04/2020   Allergy to amoxicillin 03/01/2016   GBS with sensititivies     AMS (altered mental status) 06/13/2020   Anxiety 08/21/2015   De Quervain's tenosynovitis, right 10/18/2019   Epidermoid cyst of finger of right hand 03/09/2019   Ganglion cyst of volar aspect of right wrist 03/07/2020   Injection March 07, 2020 aspiration done again April 14, 2020 repeat aspiration September 21, 2020 attempted aspiration again December 22, 2020 repeat aspiration Feb 16, 2021 repeat aspiration April 22, 2023     Ganglion cyst of wrist, right 07/05/2020   History of pregnancy induced hypertension--prior pregnancy 03/24/2016   Hypokalemia  08/21/2015   Insomnia 06/28/2020   Kidney stones    Knee joint pain    Left-sided weakness 06/28/2020   Migraine headache 03/02/2014   MS (multiple sclerosis) 2021   Nausea in adult 03/02/2014   Nephrolithiasis 03/02/2014   Pars defect of lumbar spine    L5   Piriformis syndrome of left side 12/30/2017   Injected December 30, 2017     Polyhydramnios 10/10/2023   Dx 03/11/16--AFI 27.66, 97%ile--recheck AFI in 2 weeks, weekly BPPs     Positive GBS test 03/24/2016   Pregnancy induced hypertension    Stroke-like symptoms 06/12/2020   Subluxation of extensor carpi ulnaris tendon, left, initial encounter 10/21/2018   Superficial phlebitis 12/02/2019   Syncope    Ureteral calculus 02/24/2014   Vaginal delivery 04/15/2016   Vision disturbance 06/28/2020    Patient Active Problem List   Diagnosis Date Noted   Abnormal MRI, thoracic spine 01/27/2024   Paresthesia 01/27/2024   Essential (primary) hypertension 10/13/2023   Vitamin D  deficiency 10/13/2023   History of renal calculi 10/10/2023   Numbness and tingling in right hand 03/11/2022   Bilateral carpal tunnel syndrome 07/05/2020   Ganglion cyst of wrist, right 07/05/2020   Abnormal finding on MRI of brain 06/28/2020   Dysesthesia 06/28/2020   Depression with anxiety 06/28/2020   Pes anserinus bursitis of both knees 08/17/2019   Plica syndrome of knee, right 07/03/2018   SI (sacroiliac) joint dysfunction 03/18/2018   Nonallopathic lesion  of sacral region 07/22/2017   Nonallopathic lesion of lumbosacral region 07/22/2017   Nonallopathic lesion of thoracic region 07/22/2017   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   BMI 40.0-44.9, adult (HCC) 04/14/2016   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   Atypical squamous cells of undetermined significance on cytologic smear of cervix (ASC-US ) 11/09/2015   Back pain 03/02/2014    Past Surgical History:  Procedure Laterality Date   EXTRACORPOREAL SHOCK WAVE LITHOTRIPSY Left  01/30/2017   Procedure: LEFT EXTRACORPOREAL SHOCK WAVE LITHOTRIPSY (ESWL);  Surgeon: Sherrilee Belvie CROME, MD;  Location: [facility];  Service: Urology;  Laterality: Left;   KIDNEY STONE SURGERY      OB History     Gravida  4   Para  2   Term  2   Preterm  0   AB  2   Living  2      SAB  2   IAB      Ectopic      Multiple  0   Live Births  1            Home Medications    Prior to Admission medications  Medication Sig Start Date End Date Taking? Authorizing Provider  oseltamivir  (TAMIFLU ) 75 MG capsule Take 1 capsule (75 mg total) by mouth every 12 (twelve) hours for 5 days. 09/18/24 09/23/24 Yes Iliza Blankenbeckler C, FNP  Cholecalciferol (VITAMIN D3) 1.25 MG (50000 UT) CAPS Take 1 capsule (1.25 mg total) by mouth once a week. 10/14/23   Lucius Krabbe, NP  ferrous sulfate  324 MG TBEC Take 1 tablet (324 mg total) by mouth daily with breakfast. 10/17/23   Lucius Krabbe, NP  ondansetron  (ZOFRAN ) 4 MG tablet Take 1 tablet (4 mg total) by mouth every 8 (eight) hours as needed for nausea or vomiting. 10/22/23   Lucius Krabbe, NP    Family History Family History  Problem Relation Age of Onset   Hyperlipidemia Mother    Diabetes Mother    Depression Mother    CVA Mother    Hypertension Mother    Diabetes Mellitus II Mother    Congestive Heart Failure Mother    Stroke Mother    Heart attack Mother    Hyperlipidemia Father    Heart attack Father    CVA Father    Hypertension Father    Diabetes Mellitus II Father    Heart disease Father    Stroke Father    Stroke Sister    Stroke Maternal Grandmother    Hypertension Maternal Grandmother    Hyperlipidemia Maternal Grandmother    Heart disease Maternal Grandmother    Diabetes Maternal Grandmother    Cancer Maternal Grandfather    Diabetes Maternal Grandfather     Social History Social History[1]   Allergies   Amoxicillin   Review of Systems Review of Systems  Constitutional:  Positive for chills  and fever. Negative for activity change and appetite change.  HENT:  Positive for congestion. Negative for ear pain and sore throat.   Respiratory:  Positive for cough. Negative for shortness of breath.   Cardiovascular:  Negative for chest pain.  Gastrointestinal:  Negative for abdominal pain, diarrhea, nausea and vomiting.  Musculoskeletal:  Positive for myalgias.  Skin:  Negative for color change and rash.  Neurological:  Positive for headaches. Negative for dizziness.  All other systems reviewed and are negative.    Physical Exam Triage Vital Signs ED Triage Vitals  Encounter Vitals Group  BP 09/18/24 1803 (!) 156/92     Girls Systolic BP Percentile --      Girls Diastolic BP Percentile --      Boys Systolic BP Percentile --      Boys Diastolic BP Percentile --      Pulse Rate 09/18/24 1803 90     Resp 09/18/24 1803 18     Temp 09/18/24 1803 100 F (37.8 C)     Temp Source 09/18/24 1803 Oral     SpO2 09/18/24 1803 100 %     Weight --      Height --      Head Circumference --      Peak Flow --      Pain Score 09/18/24 1802 0     Pain Loc --      Pain Education --      Exclude from Growth Chart --    No data found.  Updated Vital Signs BP (!) 156/92 (BP Location: Left Arm)   Pulse 90   Temp 100 F (37.8 C) (Oral)   Resp 18   LMP 09/18/2024 (Approximate)   SpO2 100%   Visual Acuity Right Eye Distance:   Left Eye Distance:   Bilateral Distance:    Right Eye Near:   Left Eye Near:    Bilateral Near:     Physical Exam Vitals and nursing note reviewed.  Constitutional:      General: She is not in acute distress.    Appearance: She is well-developed. She is not ill-appearing or toxic-appearing.     Comments: Pleasant female appearing stated age found sitting in chair in no acute distress.  HENT:     Head: Normocephalic and atraumatic.     Right Ear: Tympanic membrane and external ear normal.     Left Ear: Tympanic membrane and external ear normal.      Nose: Congestion present.     Mouth/Throat:     Lips: Pink.     Mouth: Mucous membranes are moist.     Pharynx: No oropharyngeal exudate or posterior oropharyngeal erythema.  Eyes:     Conjunctiva/sclera: Conjunctivae normal.  Cardiovascular:     Rate and Rhythm: Normal rate and regular rhythm.     Heart sounds: Normal heart sounds. No murmur heard. Pulmonary:     Effort: Pulmonary effort is normal. No respiratory distress.     Breath sounds: Normal breath sounds.  Skin:    General: Skin is warm and dry.  Neurological:     Mental Status: She is alert.  Psychiatric:        Mood and Affect: Mood normal.      UC Treatments / Results  Labs (all labs ordered are listed, but only abnormal results are displayed) Labs Reviewed  POC COVID19/FLU A&B COMBO    EKG   Radiology No results found.  Procedures Procedures (including critical care time)  Medications Ordered in UC Medications - No data to display  Initial Impression / Assessment and Plan / UC Course  I have reviewed the triage vital signs and the nursing notes.  Pertinent labs & imaging results that were available during my care of the patient were reviewed by me and considered in my medical decision making (see chart for details).     Vitals and triage reviewed, patient is hemodynamically stable.  COVID/flu swab is negative.  She reported that her daughter was positive for flu last week. For that reason she will be treated with Tamiflu  as  if she were positive.  Advised supportive care including Tylenol  and/or ibuprofen , guaifenesin , saline nasal spray.  Advised honey water, salt water gargles, humidifier.  Plan of care, follow-up care, return precautions given, no questions at this time.  Work note provided. Final Clinical Impressions(s) / UC Diagnoses   Final diagnoses:  None     Discharge Instructions      Your COVID/flu swab was negative, however you reported your daughter was positive for flu last week.   Because of this, you will be treated as if you are positive.  You have been given a prescription for Tamiflu .  Take 1 capsule twice a day for 5 days.  Influenza is a viral illness which will improve with rest, fluids, and medications to help with your symptoms.  Tylenol  and/or ibuprofen  for fever, body aches, pain. Guaifenesin  (plain mucinex ) and saline nasal sprays may help relieve symptoms.  2 teaspoons of honey and 1 cup of warm water every 4-6 hours may help with throat pains.  Salt water gargles, 1/2-1 teaspoon salt dissolved in 1 cup warm water, gargle and spit out several times daily for sore throat.  Humidifier in room at nighttime may help sooth cough (clean well daily).  For chest pain, shortness of breath, inability to keep food or fluids down without vomiting, fever that does not respond to Tylenol  or ibuprofen , or any other severe symptoms, please go to the ER for further evaluation.  Return to urgent care as needed, otherwise follow-up with PCP.      ED Prescriptions     Medication Sig Dispense Auth. Provider   oseltamivir  (TAMIFLU ) 75 MG capsule Take 1 capsule (75 mg total) by mouth every 12 (twelve) hours for 5 days. 10 capsule Yvette Loveless C, FNP      PDMP not reviewed this encounter.    [1]  Social History Tobacco Use   Smoking status: Never   Smokeless tobacco: Never  Vaping Use   Vaping status: Never Used  Substance Use Topics   Alcohol use: Yes    Alcohol/week: 1.0 standard drink of alcohol    Types: 1 Glasses of wine per week    Comment: rarely   Drug use: Never     Lennice Jon BROCKS, FNP 09/18/24 1929  "

## 2024-09-18 NOTE — ED Triage Notes (Signed)
 Headache,chills, and fever started yesterday. Patient has been using OTC medication for her symptoms.

## 2024-10-13 ENCOUNTER — Encounter: Payer: Managed Care, Other (non HMO) | Admitting: Family
